# Patient Record
Sex: Female | Born: 1960 | Race: White | Hispanic: No | Marital: Married | Smoking: Never smoker
Health system: Southern US, Community
[De-identification: ages and names within clinical notes are randomized; demographics above are authoritative.]

## PROBLEM LIST (undated history)

## (undated) DIAGNOSIS — J45909 Unspecified asthma, uncomplicated: Secondary | ICD-10-CM

## (undated) DIAGNOSIS — F419 Anxiety disorder, unspecified: Secondary | ICD-10-CM

## (undated) DIAGNOSIS — J189 Pneumonia, unspecified organism: Secondary | ICD-10-CM

## (undated) DIAGNOSIS — F329 Major depressive disorder, single episode, unspecified: Secondary | ICD-10-CM

## (undated) DIAGNOSIS — M1990 Unspecified osteoarthritis, unspecified site: Secondary | ICD-10-CM

## (undated) DIAGNOSIS — D649 Anemia, unspecified: Secondary | ICD-10-CM

## (undated) DIAGNOSIS — Z803 Family history of malignant neoplasm of breast: Secondary | ICD-10-CM

## (undated) DIAGNOSIS — T7840XA Allergy, unspecified, initial encounter: Secondary | ICD-10-CM

## (undated) DIAGNOSIS — K219 Gastro-esophageal reflux disease without esophagitis: Secondary | ICD-10-CM

## (undated) DIAGNOSIS — E785 Hyperlipidemia, unspecified: Secondary | ICD-10-CM

## (undated) DIAGNOSIS — IMO0001 Reserved for inherently not codable concepts without codable children: Secondary | ICD-10-CM

## (undated) DIAGNOSIS — R7303 Prediabetes: Secondary | ICD-10-CM

## (undated) DIAGNOSIS — O24419 Gestational diabetes mellitus in pregnancy, unspecified control: Secondary | ICD-10-CM

## (undated) DIAGNOSIS — F32A Depression, unspecified: Secondary | ICD-10-CM

## (undated) DIAGNOSIS — N6489 Other specified disorders of breast: Secondary | ICD-10-CM

## (undated) DIAGNOSIS — N6002 Solitary cyst of left breast: Secondary | ICD-10-CM

## (undated) HISTORY — DX: Unspecified osteoarthritis, unspecified site: M19.90

## (undated) HISTORY — DX: Gastro-esophageal reflux disease without esophagitis: K21.9

## (undated) HISTORY — PX: COLONOSCOPY: SHX174

## (undated) HISTORY — DX: Hyperlipidemia, unspecified: E78.5

## (undated) HISTORY — DX: Gestational diabetes mellitus in pregnancy, unspecified control: O24.419

## (undated) HISTORY — DX: Anemia, unspecified: D64.9

## (undated) HISTORY — DX: Depression, unspecified: F32.A

## (undated) HISTORY — DX: Unspecified asthma, uncomplicated: J45.909

## (undated) HISTORY — DX: Reserved for inherently not codable concepts without codable children: IMO0001

## (undated) HISTORY — DX: Other specified disorders of breast: N64.89

## (undated) HISTORY — DX: Major depressive disorder, single episode, unspecified: F32.9

## (undated) HISTORY — PX: UPPER GASTROINTESTINAL ENDOSCOPY: SHX188

## (undated) HISTORY — DX: Family history of malignant neoplasm of breast: Z80.3

## (undated) HISTORY — PX: WISDOM TOOTH EXTRACTION: SHX21

## (undated) HISTORY — PX: POLYPECTOMY: SHX149

## (undated) HISTORY — DX: Solitary cyst of left breast: N60.02

## (undated) HISTORY — DX: Pneumonia, unspecified organism: J18.9

## (undated) HISTORY — DX: Allergy, unspecified, initial encounter: T78.40XA

## (undated) HISTORY — DX: Anxiety disorder, unspecified: F41.9

---

## 1985-02-09 DIAGNOSIS — J189 Pneumonia, unspecified organism: Secondary | ICD-10-CM

## 1985-02-09 HISTORY — DX: Pneumonia, unspecified organism: J18.9

## 1997-06-05 ENCOUNTER — Other Ambulatory Visit: Admission: RE | Admit: 1997-06-05 | Discharge: 1997-06-05 | Payer: Self-pay | Admitting: Obstetrics and Gynecology

## 1997-08-23 ENCOUNTER — Other Ambulatory Visit: Admission: RE | Admit: 1997-08-23 | Discharge: 1997-08-23 | Payer: Self-pay | Admitting: Obstetrics and Gynecology

## 1998-06-14 ENCOUNTER — Other Ambulatory Visit: Admission: RE | Admit: 1998-06-14 | Discharge: 1998-06-14 | Payer: Self-pay | Admitting: Obstetrics and Gynecology

## 2000-08-06 ENCOUNTER — Encounter: Payer: Self-pay | Admitting: Otolaryngology

## 2000-08-06 ENCOUNTER — Encounter: Admission: RE | Admit: 2000-08-06 | Discharge: 2000-08-06 | Payer: Self-pay | Admitting: Otolaryngology

## 2002-08-08 ENCOUNTER — Other Ambulatory Visit: Admission: RE | Admit: 2002-08-08 | Discharge: 2002-08-08 | Payer: Self-pay | Admitting: Gynecology

## 2003-02-06 ENCOUNTER — Other Ambulatory Visit: Admission: RE | Admit: 2003-02-06 | Discharge: 2003-02-06 | Payer: Self-pay | Admitting: Gynecology

## 2004-03-06 ENCOUNTER — Other Ambulatory Visit: Admission: RE | Admit: 2004-03-06 | Discharge: 2004-03-06 | Payer: Self-pay | Admitting: Gynecology

## 2005-03-09 ENCOUNTER — Other Ambulatory Visit: Admission: RE | Admit: 2005-03-09 | Discharge: 2005-03-09 | Payer: Self-pay | Admitting: Gynecology

## 2006-03-12 ENCOUNTER — Other Ambulatory Visit: Admission: RE | Admit: 2006-03-12 | Discharge: 2006-03-12 | Payer: Self-pay | Admitting: Gynecology

## 2007-03-16 ENCOUNTER — Other Ambulatory Visit: Admission: RE | Admit: 2007-03-16 | Discharge: 2007-03-16 | Payer: Self-pay | Admitting: Gynecology

## 2008-03-16 ENCOUNTER — Encounter: Payer: Self-pay | Admitting: Women's Health

## 2008-03-16 ENCOUNTER — Other Ambulatory Visit: Admission: RE | Admit: 2008-03-16 | Discharge: 2008-03-16 | Payer: Self-pay | Admitting: Gynecology

## 2008-03-16 ENCOUNTER — Ambulatory Visit: Payer: Self-pay | Admitting: Women's Health

## 2009-03-25 LAB — HM PAP SMEAR: HM Pap smear: NORMAL

## 2009-07-10 LAB — HM COLONOSCOPY

## 2009-07-31 ENCOUNTER — Encounter: Payer: Self-pay | Admitting: Gastroenterology

## 2011-06-01 LAB — HM MAMMOGRAPHY

## 2012-01-10 LAB — HM DEXA SCAN: HM Dexa Scan: NORMAL

## 2012-02-01 DIAGNOSIS — N6002 Solitary cyst of left breast: Secondary | ICD-10-CM

## 2012-02-01 HISTORY — DX: Solitary cyst of left breast: N60.02

## 2012-04-26 ENCOUNTER — Encounter: Payer: Self-pay | Admitting: Obstetrics and Gynecology

## 2012-05-13 ENCOUNTER — Ambulatory Visit: Payer: Self-pay | Admitting: Obstetrics and Gynecology

## 2012-05-13 ENCOUNTER — Encounter: Payer: Self-pay | Admitting: Obstetrics and Gynecology

## 2012-05-13 ENCOUNTER — Ambulatory Visit (INDEPENDENT_AMBULATORY_CARE_PROVIDER_SITE_OTHER): Payer: BC Managed Care – PPO | Admitting: Obstetrics and Gynecology

## 2012-05-13 VITALS — BP 112/60 | Ht 65.0 in | Wt 141.0 lb

## 2012-05-13 DIAGNOSIS — Z01419 Encounter for gynecological examination (general) (routine) without abnormal findings: Secondary | ICD-10-CM

## 2012-05-13 NOTE — Progress Notes (Signed)
52 y.o.  Married  Caucasian female   G2P2002 here for annual exam.  Saw Dr. Farrel Gobble in Dec 2013 for a left breast lump, had dx mammo which showed a cyst, and screening mammo in May 19, 2014was rec. Pt already has that appt.  Also treated recently for outpatient pneumonia. Menses are regular but light, lasting 5-6 days.  No sig v-m sx's.  Father had a stroke in 06/28/11 and died 9 months later, in 2012/02/28.     Patient's last menstrual period was 04/23/2012.          Sexually active: yes  The current method of family planning is IUD.    Exercising: walking 3 days a week Last mammogram:  02/01/12 Lt Breast (benign) 06/02/11 normal Last pap smear:03/25/09 neg History of abnormal pap: no Smoking:no Alcohol: occ wine Last colonoscopy:07/10/09 polyp every 3 years Dr. Chales Abrahams in [place] Last Bone Density: 01/2012 norma.  Ordered  b/c Vit D level was low.  Dr. Darcey Nora who was seeing her for asthma ordered the Vit D and the BMD  Last tetanus shot: 3 years ago Last cholesterol check: 09/2011 normal  Hgb:   pcp             Urine: pcp    Health Maintenance  Topic Date Due  . Tetanus/tdap  05/06/1978  . Pap Smear  03/25/2012  . Influenza Vaccine  10/10/2012  . Mammogram  05/31/2013  . Colonoscopy  07/11/2019    Family History  Problem Relation Age of Onset  . Breast cancer Mother 22  . Depression Mother   . Anxiety disorder Mother   . Osteoporosis Mother   . Depression Sister   . Anxiety disorder Sister   . Stroke Father     died February 23, 2012  . Dementia Father   . Anxiety disorder Brother   . Depression Brother   . Dystonia Brother     cervical    There is no problem list on file for this patient.   Past Medical History  Diagnosis Date  . Depression   . Anxiety   . Asthma   . Reflux   . Gestational diabetes   . Anemia   . Pneumonia 1987  . Single cyst of left breast 02/01/12    Diagnostic mammogram and ultrasound - cyst at 2:30    Past Surgical History  Procedure Laterality Date  .  Cesarean section  28-Jun-1991, 06-27-93    Allergies: Shellfish allergy and Penicillins  Current Outpatient Prescriptions  Medication Sig Dispense Refill  . Budesonide (RHINOCORT AQUA NA) Place into the nose daily.      . budesonide-formoterol (SYMBICORT) 160-4.5 MCG/ACT inhaler Inhale 2 puffs into the lungs 2 (two) times daily.      Marland Kitchen Dexlansoprazole (DEXILANT PO) Take by mouth.      . fluvoxaMINE (LUVOX) 100 MG tablet Take 100 mg by mouth at bedtime.      Marland Kitchen levonorgestrel (MIRENA) 20 MCG/24HR IUD 1 each by Intrauterine route once. Place 12/08/2010.      Marland Kitchen RANITIDINE HCL PO Take by mouth.      . traZODone (DESYREL) 50 MG tablet Take 50 mg by mouth at bedtime. Patient takes 18.5. mg po q hs.       No current facility-administered medications for this visit.    ROS: Pertinent items are noted in HPI.  Exam:    BP 112/60  Ht 5\' 5"  (1.651 m)  Wt 141 lb (63.957 kg)  BMI 23.46 kg/m2  LMP 04/23/2012  Wt Readings from Last 3 Encounters:  05/13/12 141 lb (63.957 kg)     Ht Readings from Last 3 Encounters:  05/13/12 5\' 5"  (1.651 m)    General appearance: alert, cooperative and appears stated age Head: Normocephalic, without obvious abnormality, atraumatic Neck: no adenopathy, supple, symmetrical, trachea midline and thyroid not enlarged, symmetric, no tenderness/mass/nodules Lungs: clear to auscultation bilaterally Breasts: Inspection negative, No nipple retraction or dimpling, No nipple discharge or bleeding, No axillary or supraclavicular adenopathy, Normal to palpation without dominant masses Heart: regular rate and rhythm Abdomen: soft, non-tender; bowel sounds normal; no masses,  no organomegaly Extremities: extremities normal, atraumatic, no cyanosis or edema Skin: Skin color, texture, turgor normal. No rashes or lesions Lymph nodes: Cervical, supraclavicular, and axillary nodes normal. No abnormal inguinal nodes palpated Neurologic: Grossly normal   Pelvic: External genitalia:  no  lesions              Urethra:  normal appearing urethra with no masses, tenderness or lesions              Bartholins and Skenes: normal                 Vagina: normal appearing vagina with normal color and discharge, no lesions              Cervix: normal appearance, IUD strings visualized              Pap taken: yes        Bimanual Exam:  Uterus:  uterus is normal size, shape, consistency and nontender, anteflexed, small, mobile                                      Adnexa: normal adnexa in size, nontender and no masses                                      Rectovaginal: Confirms                                      Anus:  normal sphincter tone, no lesions  A: normal perimenopausal exam, Mirena in place since 12/08/2010     P: mammogram pap smear counseled on breast self exam, mammography screening, adequate intake of calcium and vitamin D, diet and exercise return annually or prn     Oldest son is a Printmaker at  [employer] in [place] and loving it; youngest son is sr in high school and has bipolar illness, planning to go to community college in [place] or to [place].      An After Visit Summary was printed and given to the patient.

## 2012-05-13 NOTE — Patient Instructions (Signed)

## 2012-11-09 ENCOUNTER — Encounter: Payer: Self-pay | Admitting: Gastroenterology

## 2013-05-19 ENCOUNTER — Ambulatory Visit: Payer: BC Managed Care – PPO | Admitting: Obstetrics and Gynecology

## 2013-05-25 ENCOUNTER — Ambulatory Visit (INDEPENDENT_AMBULATORY_CARE_PROVIDER_SITE_OTHER): Payer: Managed Care, Other (non HMO) | Admitting: Obstetrics & Gynecology

## 2013-05-25 ENCOUNTER — Ambulatory Visit: Payer: BC Managed Care – PPO | Admitting: Obstetrics & Gynecology

## 2013-05-25 ENCOUNTER — Encounter: Payer: Self-pay | Admitting: Obstetrics & Gynecology

## 2013-05-25 VITALS — BP 106/64 | HR 68 | Resp 16 | Ht 65.25 in | Wt 147.8 lb

## 2013-05-25 DIAGNOSIS — N951 Menopausal and female climacteric states: Secondary | ICD-10-CM

## 2013-05-25 DIAGNOSIS — Z01419 Encounter for gynecological examination (general) (routine) without abnormal findings: Secondary | ICD-10-CM

## 2013-05-25 NOTE — Patient Instructions (Signed)

## 2013-05-25 NOTE — Progress Notes (Addendum)
53 y.o. [id number] MarriedCaucasianF here for annual exam.  Has Mirena IUD that was placed 12/08/10.  Still has monthly cycle.  Is light and spotting but lasts about a week.  Has noticed more hot flashes.  Wonders if this is early menopause or her Tempu-pedic mattress.  Patient's last menstrual period was 05/22/2013.          Sexually active: yes  The current method of family planning is IUD.    Exercising: yes   Smoker:  no  Health Maintenance: Pap:  05/13/12-WNL/negative HR HPV History of abnormal Pap:  yes MMG:  06/01/12 3D-normal Colonoscopy:  10/14--repeat in 5 years.  Dr Lyndel Safe in [place].  Records scanned into EPIC. BMD:   2014-normal at [place]:  Up to date with PCP Screening Labs: PCP, Hb today: PCP, Urine today: PCP   reports that she has never smoked. She has never used smokeless tobacco. She reports that she drinks alcohol. She reports that she does not use illicit drugs.  Past Medical History  Diagnosis Date  . Depression   . Anxiety   . Asthma   . Reflux   . Gestational diabetes   . Anemia   . Pneumonia 1987  . Single cyst of left breast 02/01/12    Diagnostic mammogram and ultrasound - cyst at 2:30    Past Surgical History  Procedure Laterality Date  . Cesarean section  1993, 1995    Current Outpatient Prescriptions  Medication Sig Dispense Refill  . budesonide-formoterol (SYMBICORT) 160-4.5 MCG/ACT inhaler Inhale 2 puffs into the lungs 2 (two) times daily.      . Cholecalciferol (VITAMIN D PO) Take 1,000 Int'l Units by mouth daily.      Marland Kitchen Dexlansoprazole (DEXILANT PO) Take by mouth.      . fluvoxaMINE (LUVOX) 100 MG tablet Take 100 mg by mouth at bedtime.      Marland Kitchen levonorgestrel (MIRENA) 20 MCG/24HR IUD 1 each by Intrauterine route once. Place 12/08/2010.      . traZODone (DESYREL) 50 MG tablet Take 50 mg by mouth at bedtime. Patient takes 60.5. mg po q hs.      . Budesonide (RHINOCORT AQUA NA) Place into the nose daily.       No current  facility-administered medications for this visit.    Family History  Problem Relation Age of Onset  . Breast cancer Mother 68  . Depression Mother   . Anxiety disorder Mother   . Osteoporosis Mother   . Depression Sister   . Anxiety disorder Sister   . Stroke Father     died 02/29/12  . Dementia Father   . Anxiety disorder Brother   . Depression Brother   . Dystonia Brother     cervical  . Thyroid disease Mother     on synthroid    ROS:  Pertinent items are noted in HPI.  Otherwise, a comprehensive ROS was negative.  Exam:   BP 106/64  Pulse 68  Resp 16  Ht 5' 5.25" (1.657 m)  Wt 147 lb 12.8 oz (67.042 kg)  BMI 24.42 kg/m2  LMP 05/22/2013  Weight change: -5lb  Height: 5' 5.25" (165.7 cm)  Ht Readings from Last 3 Encounters:  05/25/13 5' 5.25" (1.657 m)  05/13/12 5\' 5"  (1.651 m)    General appearance: alert, cooperative and appears stated age Head: Normocephalic, without obvious abnormality, atraumatic Neck: no adenopathy, supple, symmetrical, trachea midline and thyroid normal to inspection and palpation Lungs: clear to auscultation bilaterally Breasts: normal  appearance, no masses or tenderness Heart: regular rate and rhythm Abdomen: soft, non-tender; bowel sounds normal; no masses,  no organomegaly Extremities: extremities normal, atraumatic, no cyanosis or edema Skin: Skin color, texture, turgor normal. No rashes or lesions Lymph nodes: Cervical, supraclavicular, and axillary nodes normal. No abnormal inguinal nodes palpated Neurologic: Grossly normal   Pelvic: External genitalia:  no lesions              Urethra:  normal appearing urethra with no masses, tenderness or lesions              Bartholins and Skenes: normal                 Vagina: normal appearing vagina with normal color and discharge, no lesions              Cervix: no lesions, IUD string noted              Pap taken: no Bimanual Exam:  Uterus:  normal size, contour, position, consistency,  mobility, non-tender              Adnexa: normal adnexa and no mass, fullness, tenderness               Rectovaginal: Confirms               Anus:  normal sphincter tone, no lesions  A:  Well Woman with normal exam Perimenopausal symptoms Mirena IUD for [facility], placed 10/12 Family hx of breast cancer in mother age 54  P:   Mammogram yearly.  D/W pt 3D due to breast density. pap smear not obtained.  Neg pap with neg HR HPV 4/14 Long today return annually or prn  An After Visit Summary was printed and given to the patient.

## 2013-05-26 LAB — FOLLICLE STIMULATING HORMONE: FSH: 35 m[IU]/mL

## 2013-08-23 ENCOUNTER — Telehealth: Payer: Self-pay | Admitting: Obstetrics & Gynecology

## 2013-08-23 NOTE — Telephone Encounter (Signed)
Patient thinks she is having menopausal symptoms. Patient would an appointment with [facility].

## 2013-08-24 NOTE — Telephone Encounter (Signed)
Spoke with patient. Patient is feeling increased fatigued and "brain fog." Has recent appointment with primary care and had blood work done which patient states was normal. No hot flashes. No bleeding, but still having some slight spotting with mirena.  Requests office visit with Dr. Sabra Heck to discuss hormone concerns. Will bring copies of blood work from [facility]. Requests Friday appointment. Scheduled office visit with Dr. Sabra Heck for 09/22/13. She will call back with any worsening symptoms or concerns as needed.  Routing to provider for final review. Patient agreeable to disposition. Will close encounter

## 2013-09-22 ENCOUNTER — Ambulatory Visit (INDEPENDENT_AMBULATORY_CARE_PROVIDER_SITE_OTHER): Payer: Managed Care, Other (non HMO) | Admitting: Obstetrics & Gynecology

## 2013-09-22 VITALS — BP 118/80 | HR 68 | Resp 16 | Wt 149.2 lb

## 2013-09-22 DIAGNOSIS — R5381 Other malaise: Secondary | ICD-10-CM

## 2013-09-22 DIAGNOSIS — N951 Menopausal and female climacteric states: Secondary | ICD-10-CM

## 2013-09-22 DIAGNOSIS — R7309 Other abnormal glucose: Secondary | ICD-10-CM

## 2013-09-22 DIAGNOSIS — R5383 Other fatigue: Secondary | ICD-10-CM

## 2013-09-22 MED ORDER — ESTRADIOL 1 MG PO TABS: 1.0000 mg | ORAL_TABLET | Freq: Every day | ORAL | Status: DC

## 2013-09-22 NOTE — Progress Notes (Addendum)
Subjective:     Patient ID: Allison Soto, female   DOB: 1960-04-15, 53 y.o.   MRN: [id number]  HPI 53 yo G2P2 here for discussion of what she feels like are "hormonal" symptoms.  Feels like her focus is not the same.  She feels very "disjointed" in the way she thinks and then speaks.  Pt did increase her to Luvox 150mg .  She did this for about a month and she doesn't feel like it made any difference.  Pt saw PCP at [facility] 01/1913 and had a lot of labs done.  Brought those with her.  Reviewed with pt.  Will be scanned into EPIC.  ANA, Antistreptolysin O-titer, CRP, CMP, uric acid, parvovirus testing, RF, sed rate.  All was normal.  Also had lipids 2/15 which was also normal.  She is having some mild hot flashes but this is tolerable.  It is the difficulty feeling mentally clear as well as fatigue.  Reports at about 3pm in the afternoon, she feels completely exhausted.  Sleep is ok.    Review of Systems  All other systems reviewed and are negative.      Objective:   Physical Exam  Constitutional: She is oriented to person, place, and time. She appears well-developed and well-nourished.  Neurological: She is alert and oriented to person, place, and time.  Skin: Skin is warm and dry.  Psychiatric: She has a normal mood and affect.       Assessment:     Possible menopausal changes Mirena IUD with amenorrhea (placed 10/12)    Plan:     Will start Estradiol 1.0 1/2 tab daily.  Risks of DVT/PE, stroke, MI, and breast cancer all discussed.  Pt willing and desirous of proceeding FSH, CBC and ferritin HbA1C Vit B 12, Vit d  TSH     ~20 minutes spent with patient >50% of time was in face to face discussion of above.

## 2013-09-23 LAB — FOLLICLE STIMULATING HORMONE: FSH: 8.3 m[IU]/mL

## 2013-09-23 LAB — CBC
HEMATOCRIT: 44 % (ref 36.0–46.0)
HEMOGLOBIN: 15.4 g/dL — AB (ref 12.0–15.0)
MCH: 31.7 pg (ref 26.0–34.0)
MCHC: 35 g/dL (ref 30.0–36.0)
MCV: 90.5 fL (ref 78.0–100.0)
Platelets: 243 10*3/uL (ref 150–400)
RBC: 4.86 MIL/uL (ref 3.87–5.11)
RDW: 13.2 % (ref 11.5–15.5)
WBC: 8.9 10*3/uL (ref 4.0–10.5)

## 2013-09-23 LAB — VITAMIN B12: Vitamin B-12: 613 pg/mL (ref 211–911)

## 2013-09-23 LAB — FERRITIN: Ferritin: 67 ng/mL (ref 10–291)

## 2013-09-23 LAB — HEMOGLOBIN A1C
Hgb A1c MFr Bld: 6 % — ABNORMAL HIGH (ref <5.7)
MEAN PLASMA GLUCOSE: 126 mg/dL — AB (ref <117)

## 2013-09-23 LAB — VITAMIN D 25 HYDROXY (VIT D DEFICIENCY, FRACTURES): VIT D 25 HYDROXY: 63 ng/mL (ref 30–89)

## 2013-09-23 LAB — TSH: TSH: 1.222 u[IU]/mL (ref 0.350–4.500)

## 2013-09-27 ENCOUNTER — Encounter: Payer: Self-pay | Admitting: Obstetrics & Gynecology

## 2013-09-27 DIAGNOSIS — R5383 Other fatigue: Secondary | ICD-10-CM

## 2013-09-27 DIAGNOSIS — R5381 Other malaise: Secondary | ICD-10-CM | POA: Insufficient documentation

## 2013-09-27 DIAGNOSIS — R7309 Other abnormal glucose: Secondary | ICD-10-CM | POA: Insufficient documentation

## 2013-09-28 ENCOUNTER — Telehealth: Payer: Self-pay

## 2013-09-28 NOTE — Telephone Encounter (Signed)
Pt informed of results and voiced understanding. Pt PCP is Dr. Ann Held in [place]. She states she will follow up with him. Pt has made an appt for an recheck on 11/14/13.  Routed to provider for final review Encounter closed

## 2013-09-28 NOTE — Telephone Encounter (Signed)
Notes Recorded by Lyman Speller, MD on 09/27/2013 at 6:13 AM Please inform pt that TSH, CBC, B12, Ferritin, and Vit D are all normal. HbA1C is 6.0 which is elevated and shows borderline diabetes. Who is her PCP? She does need follow up with them. Her Rand is in the normal range. I am ok with her continuing the trial of HRT. She needs follow up again in 6-8 weeks for recheck.

## 2013-10-26 ENCOUNTER — Other Ambulatory Visit: Payer: Self-pay | Admitting: Obstetrics & Gynecology

## 2013-10-26 NOTE — Telephone Encounter (Signed)
Pt says she is needing a refill on her Estradiol sent to [facility] in [place]. Phone number is [phone].

## 2013-10-27 ENCOUNTER — Other Ambulatory Visit: Payer: Self-pay | Admitting: Obstetrics & Gynecology

## 2013-10-27 MED ORDER — ESTRADIOL 1 MG PO TABS: 1.0000 mg | ORAL_TABLET | Freq: Every day | ORAL | Status: DC

## 2013-10-27 NOTE — Telephone Encounter (Signed)
Last AEX: 05/25/13 Last refill:09/22/13 #30 X 0 Current AEX:06/01/14 Last MMG: 06/21/13 Bi-Rads Neg  Please advise

## 2013-10-30 NOTE — Telephone Encounter (Signed)
Last refilled: 10/27/13 #30/0refills by Dr. Sabra Heck Last AEX: 05/25/13 with Dr. Bernita Buffy Scheduled: 06/01/14 with Dr. Sabra Heck Last Mammogram: 06/19/13 Bi-Rads 0 -06/21/13 Right Breast US - Bi-Rads 2:Benign  Please Advise.

## 2013-11-14 ENCOUNTER — Ambulatory Visit (INDEPENDENT_AMBULATORY_CARE_PROVIDER_SITE_OTHER): Payer: Managed Care, Other (non HMO) | Admitting: Nurse Practitioner

## 2013-11-14 ENCOUNTER — Encounter: Payer: Self-pay | Admitting: Obstetrics & Gynecology

## 2013-11-14 VITALS — BP 112/74 | HR 64 | Resp 16 | Wt 153.8 lb

## 2013-11-14 DIAGNOSIS — Z79890 Hormone replacement therapy (postmenopausal): Secondary | ICD-10-CM

## 2013-11-14 DIAGNOSIS — R0789 Other chest pain: Secondary | ICD-10-CM

## 2013-11-14 DIAGNOSIS — R071 Chest pain on breathing: Secondary | ICD-10-CM

## 2013-11-14 MED ORDER — ESTRADIOL 1 MG PO TABS: 1.0000 mg | ORAL_TABLET | Freq: Every day | ORAL | Status: DC

## 2013-11-14 NOTE — Progress Notes (Signed)
Subjective:     Patient ID: Allison Soto, female   DOB: [birth year], 53 y.o.   MRN: [id number]  HPI  This 53 yo MW Fe comes for 2 problems.  She was to return for a 6 weeks follow up on ERT.   She has a Mirena IUD since 11/2010 with regular menses.  But recently 6 weeks ago started on Estrogen replacement for 'brain fog', mood changes and general anxiety symptoms.  Has felt better on HRT other than initially with breast tenderness that has resolved.  She feels that she has done much better on ERT and wants to continue.  Her second problem was a sudden onset of chest wall pain at the [place] area and under left breast that occurred in June.  States she was sitting in her car and was talking with another drug rep in their car when she turned suddenly at an odd angle causing her to have this pain.  It was severe enough to take her breathe.  She had pain 7/10 scale and is non radiating.  She later felt better and went as planned on her trip to [place].  More recently went to PCP and had evaluation in which he thought she had costal Costochondritis and gave her Voltaren gel to use prn.  She did not want to use that on her breast area.  He also did a stress test which was normal.  Now when she touches the area is the only time it hurts.  No pain with deep breathing or upper body movement. Denies dyspnea, cough, URI ,and GI symptoms.  Some history of flatus but nothing new.  She has stopped wearing an under wire bra with some help.  No history of trauma and / or fall.   Review of Systems  Constitutional: Negative for fever, chills, diaphoresis, activity change, appetite change and fatigue.  HENT: Negative.   Eyes: Negative.   Respiratory: Negative for apnea, cough, choking, chest tightness, shortness of breath, wheezing and stridor.   Cardiovascular: Negative for chest pain, palpitations and leg swelling.       Negative stress test 10/14/13  Gastrointestinal: Negative.  Negative for nausea, abdominal pain,  constipation, anal bleeding and rectal pain.  Endocrine: Negative for heat intolerance, polydipsia and polyphagia.  Genitourinary: Negative for dysuria, frequency, hematuria, flank pain, decreased urine volume, vaginal bleeding, vaginal discharge, difficulty urinating, vaginal pain, menstrual problem, pelvic pain and dyspareunia.  Musculoskeletal: Negative.   Skin: Negative.   Neurological: Negative.   Psychiatric/Behavioral: Negative.  Negative for confusion and agitation. The patient is not nervous/anxious.        Objective:   Physical Exam  Constitutional: She is oriented to person, place, and time. She appears well-developed and well-nourished.  Cardiovascular: Normal rate and regular rhythm.   Pulmonary/Chest: Effort normal and breath sounds normal. No respiratory distress. She has no wheezes. She has no rales. She exhibits tenderness.  Abdominal: Soft. There is no tenderness. There is no rebound.    No flank pain.  There is tenderness along the Xiphoid to the most tender area under the left breast at the 5 th intercostal space about 7:00 position under the breast.  Bilaterally there is no breast mass, nipple discharge, nodes.  The pain can only be reproducible with touch.  Musculoskeletal: Normal range of motion.  Neurological: She is alert and oriented to person, place, and time.  Skin: Skin is warm and dry.  Psychiatric: She has a normal mood and affect. Her behavior is normal. Judgment  and thought content normal.       Assessment:     Menopausal symptoms improved on Estradiol 1 mg along with Mirena IUD Costochondritis of left chest wall - most likely a pull injury in June - getting better    Plan:     Will have her to avoid any manipulation of this area and try OTC NSAID's with Aleve BID for a week If symptoms persist will see her back She will continue with Estradiol 1 mg daily as directed - refill until AEX in April.

## 2013-11-15 NOTE — Progress Notes (Signed)
Reviewed personally.  M. Suzanne Hydeia Mcatee, MD.  

## 2013-12-11 ENCOUNTER — Encounter: Payer: Self-pay | Admitting: Obstetrics & Gynecology

## 2014-06-01 ENCOUNTER — Ambulatory Visit (INDEPENDENT_AMBULATORY_CARE_PROVIDER_SITE_OTHER): Payer: Managed Care, Other (non HMO) | Admitting: Obstetrics & Gynecology

## 2014-06-01 ENCOUNTER — Encounter: Payer: Self-pay | Admitting: Obstetrics & Gynecology

## 2014-06-01 VITALS — BP 118/82 | HR 96 | Ht 65.0 in | Wt 152.8 lb

## 2014-06-01 DIAGNOSIS — Z0000 Encounter for general adult medical examination without abnormal findings: Secondary | ICD-10-CM

## 2014-06-01 DIAGNOSIS — Z01419 Encounter for gynecological examination (general) (routine) without abnormal findings: Secondary | ICD-10-CM | POA: Diagnosis not present

## 2014-06-01 DIAGNOSIS — Z124 Encounter for screening for malignant neoplasm of cervix: Secondary | ICD-10-CM

## 2014-06-01 LAB — POCT URINALYSIS DIPSTICK
LEUKOCYTES UA: NEGATIVE
UROBILINOGEN UA: NEGATIVE
pH, UA: 5

## 2014-06-01 NOTE — Addendum Note (Signed)
Addended by: Megan Salon on: 06/01/2014 02:58 PM   Modules accepted: Orders, SmartSet

## 2014-06-01 NOTE — Progress Notes (Signed)
54 y.o. [id number] MarriedCaucasianF here for annual exam.  Doing well.  Having just spotting about every month.  This is very light.  [place]ank last year was 8.3.    Pt was on HRT in the past due to lack of focus.  This didn't help.  Tried wellbutrin as well.  This mad her feel like she was "jumping out of her skin".  Was then tried on Focalin.  Pt really thinks this helped A LOT!!  PCP:  Dr. Nicki Reaper.  Planning on doing blood work there this summer.    Patient's last menstrual period was 04/30/2014.          Sexually active: Yes.    The current method of family planning is Mirena IUD.    Exercising: Yes.    walk 3-5x/wk Smoker:  no  Health Maintenance: Pap:  05/13/12 NEG HR HPV negative History of abnormal Pap:  no MMG: 06/19/13 Bi-Rads 0; Right Breast Ultrasound oval complicated cyst benign. Colonoscopy:  11/09/2012 Polyp found/removed f/u in 2019 BMD:   2013-Normal TDaP:  2014 Screening Labs: Done by PCP: Ann Held, MD, Urine today: Glucose +   reports that she has never smoked. She has never used smokeless tobacco. She reports that she drinks alcohol. She reports that she does not use illicit drugs.  Past Medical History  Diagnosis Date  . Depression   . Anxiety   . Asthma   . Reflux   . Gestational diabetes   . Anemia   . Pneumonia 1987  . Single cyst of left breast 02/01/12    Diagnostic mammogram and ultrasound - cyst at 2:30    Past Surgical History  Procedure Laterality Date  . Cesarean section  1993, 1995    Current Outpatient Prescriptions  Medication Sig Dispense Refill  . budesonide-formoterol (SYMBICORT) 160-4.5 MCG/ACT inhaler Inhale 2 puffs into the lungs 2 (two) times daily.    . Cetirizine HCl (ZYRTEC PO) Take by mouth.    . Cholecalciferol (VITAMIN D PO) Take 1,000 Int'l Units by mouth daily.    Marland Kitchen Dexlansoprazole (DEXILANT PO) Take by mouth.    . dexmethylphenidate (FOCALIN XR) 5 MG 24 hr capsule Take 5 mg by mouth daily.  0  . fluvoxaMINE (LUVOX) 100 MG tablet  Take 100 mg by mouth at bedtime. Taking 150mg  daily    . levonorgestrel (MIRENA) 20 MCG/24HR IUD 1 each by Intrauterine route once. Place 12/08/2010.    Marland Kitchen RHINOCORT AQUA 32 MCG/ACT nasal spray as needed.  1  . traZODone (DESYREL) 50 MG tablet Take 50 mg by mouth at bedtime. Patient takes 20.5. mg po q hs.     No current facility-administered medications for this visit.    Family History  Problem Relation Age of Onset  . Breast cancer Mother 77  . Depression Mother   . Anxiety disorder Mother   . Osteoporosis Mother   . Depression Sister   . Anxiety disorder Sister   . Stroke Father     died March 05, 2012  . Dementia Father   . Anxiety disorder Brother   . Depression Brother   . Dystonia Brother     cervical  . Thyroid disease Mother     on synthroid    ROS:  Pertinent items are noted in HPI.  Otherwise, a comprehensive ROS was negative.  Exam:   BP 118/82 mmHg  Ht 5\' 5"  (1.651 m)  Wt 152 lb 12.8 oz (69.31 kg)  BMI 25.43 kg/m2  LMP 04/30/2014  +5#  Height:  5\' 5"  (165.1 cm)  Ht Readings from Last 3 Encounters:  06/01/14 5\' 5"  (1.651 m)  05/25/13 5' 5.25" (1.657 m)  05/13/12 5\' 5"  (1.651 m)    General appearance: alert, cooperative and appears stated age Head: Normocephalic, without obvious abnormality, atraumatic Neck: no adenopathy, supple, symmetrical, trachea midline and thyroid normal to inspection and palpation Lungs: clear to auscultation bilaterally Breasts: normal appearance, no masses or tenderness Heart: regular rate and rhythm Abdomen: soft, non-tender; bowel sounds normal; no masses,  no organomegaly Extremities: extremities normal, atraumatic, no cyanosis or edema Skin: Skin color, texture, turgor normal. No rashes or lesions Lymph nodes: Cervical, supraclavicular, and axillary nodes normal. No abnormal inguinal nodes palpated Neurologic: Grossly normal   Pelvic: External genitalia:  no lesions              Urethra:  normal appearing urethra with no  masses, tenderness or lesions              Bartholins and Skenes: normal                 Vagina: normal appearing vagina with normal color and discharge, no lesions              Cervix: no lesions              Pap taken: Yes.   Bimanual Exam:  Uterus:  normal size, contour, position, consistency, mobility, non-tender              Adnexa: normal adnexa and no mass, fullness, tenderness               Rectovaginal: Confirms               Anus:  normal sphincter tone, no lesions  Chaperone was present for exam.  A:  Well Woman with normal exam Perimenopausal symptoms   Mirena IUD for [facility], placed 10/12 Family hx of breast cancer in mother age 23  P: Mammogram yearly. D/W pt 3D due to breast density. Neg pap with neg HR HPV 4/14.  Pap only today Plan [facility] next year.  May need to plan to replaced Mirena next year if [facility] is low. return annually or prn

## 2014-06-06 LAB — IPS PAP TEST WITH REFLEX TO HPV

## 2015-01-31 ENCOUNTER — Ambulatory Visit (INDEPENDENT_AMBULATORY_CARE_PROVIDER_SITE_OTHER): Payer: Managed Care, Other (non HMO) | Admitting: Obstetrics & Gynecology

## 2015-01-31 ENCOUNTER — Ambulatory Visit (INDEPENDENT_AMBULATORY_CARE_PROVIDER_SITE_OTHER): Payer: Managed Care, Other (non HMO)

## 2015-01-31 ENCOUNTER — Encounter: Payer: Self-pay | Admitting: Obstetrics & Gynecology

## 2015-01-31 VITALS — BP 110/66 | HR 64 | Temp 97.7°F | Ht 65.0 in | Wt 148.0 lb

## 2015-01-31 DIAGNOSIS — R102 Pelvic and perineal pain: Secondary | ICD-10-CM | POA: Diagnosis not present

## 2015-01-31 DIAGNOSIS — D251 Intramural leiomyoma of uterus: Secondary | ICD-10-CM

## 2015-01-31 DIAGNOSIS — Z975 Presence of (intrauterine) contraceptive device: Secondary | ICD-10-CM | POA: Diagnosis not present

## 2015-01-31 DIAGNOSIS — M545 Low back pain: Secondary | ICD-10-CM

## 2015-01-31 LAB — POCT URINALYSIS DIPSTICK
Bilirubin, UA: NEGATIVE
Glucose, UA: NEGATIVE
Ketones, UA: NEGATIVE
Leukocytes, UA: NEGATIVE
Nitrite, UA: NEGATIVE
PROTEIN UA: NEGATIVE
RBC UA: NEGATIVE
UROBILINOGEN UA: NEGATIVE
pH, UA: 5

## 2015-01-31 MED ORDER — CYCLOBENZAPRINE HCL 10 MG PO TABS: 10.0000 mg | ORAL_TABLET | Freq: Three times a day (TID) | ORAL | Status: DC | PRN

## 2015-01-31 MED ORDER — HYDROCODONE-ACETAMINOPHEN 5-325 MG PO TABS: 1.0000 | ORAL_TABLET | Freq: Four times a day (QID) | ORAL | Status: DC | PRN

## 2015-01-31 NOTE — Progress Notes (Signed)
Subjective:     Patient ID: Allison Soto, female   DOB: 1961/01/12, 54 y.o.   MRN: [id number]  HPI 54 yo G2P2 MWF here for complaint of lower pelvic cramping with associated lower left back pain.  She was recently at the beach and did a lot of walking in the sand but denies any type of trauma or injury.  Denies recurrent low back issues.  She's a bit anxious about this going into the holiday weekend.    Denies dysuria and hematuria.  Denies urinary urgency.  Denies fever.  No hx of renal stones.  LMP was 10 days ago.  This was a normal "cycle" Cycle did skip for a few months in the summer.  Has mirena IUD so flow isn't heavy.  U[facility] 4/15 was 35.  Review of Systems  All other systems reviewed and are negative.      Objective:   Physical Exam  Constitutional: She is oriented to person, place, and time. She appears well-developed and well-nourished.  Abdominal: Soft. Bowel sounds are normal. She exhibits no distension. There is no tenderness. There is no rebound and no guarding.  Genitourinary: Vagina normal. There is no rash, tenderness, lesion or injury on the right labia. There is no rash, tenderness, lesion or injury on the left labia. Uterus is enlarged (about 10 weeks size and mobile, smooth contour). Cervix exhibits no motion tenderness, no discharge and no friability. Right adnexum displays no mass, no tenderness and no fullness. Left adnexum displays no mass, no tenderness and no fullness.  IUD string noted.  Lymphadenopathy:       Right: No inguinal adenopathy present.       Left: No inguinal adenopathy present.  Neurological: She is alert and oriented to person, place, and time.  Skin: Skin is warm and dry.  Psychiatric: She has a normal mood and affect.   D/w pt proceeding with PUS since nothing on physical exam was abnormal and POCT of urine was negative.  Pt in agreement.    Findings on PUS: Uterus:  10.5 x 6.5 x 5.4cm with IUD in correct location and multiple small  fibroids measuring 2.2cm, 0.6cm, 1.0cm, 0.6cm, 2.4cm Endometrium:  3.5mm Ovary:  Left 2.1 x 1.4 x 1.3cm   Right 2.2 x 1.4 x [id number] with 65mm follicle    Assessment:     Left low back pain Small uterine fibroids Small 41mm right follicle     Plan:     With no findings on urine testing, physical exam or ultrasound except for small fibroids and correctly placed IUD, pt may have pulled low back muscle.  Pt will try flexeril 10mg  up to TID.  RX for 10 Vicodin 5/325mg  given to be taken 1-2 every 6 hours as needed.  Heat and stretching encouraged.  Pt knows to be seen in ER or urgent care if symptoms worsen.  Pt voices clear understanding.  In total, about 30 minutes spent with pt with >50% of this time in face to face discussion of findings, ultrasound, treatment, possible causes of pain.

## 2015-02-06 DIAGNOSIS — Z975 Presence of (intrauterine) contraceptive device: Secondary | ICD-10-CM | POA: Insufficient documentation

## 2015-02-06 DIAGNOSIS — D251 Intramural leiomyoma of uterus: Secondary | ICD-10-CM | POA: Insufficient documentation

## 2015-02-07 ENCOUNTER — Other Ambulatory Visit: Payer: Managed Care, Other (non HMO)

## 2015-04-17 ENCOUNTER — Other Ambulatory Visit: Payer: Self-pay | Admitting: Obstetrics and Gynecology

## 2015-05-20 ENCOUNTER — Telehealth: Payer: Self-pay | Admitting: Obstetrics & Gynecology

## 2015-05-20 NOTE — Telephone Encounter (Addendum)
appt made for tomorrow @ 4:pm. Left voicemail for pt appt rescheduled. Was scheduled at 9:15. Rescheduled for 4:00pm Tuesday.  MMG 07/10/14 faxing to [place] from [place].  Has appt this June.

## 2015-05-20 NOTE — Telephone Encounter (Signed)
I do not have pt on this medication for her last two visits.  She stopped it prior to her last AEX.  She needs an OV to discuss restarting this as it did not help the last time.  Also, she is almost two years overdue for a MMG.  Last one was 5/15.  She will need a MMG before starting HRT.

## 2015-05-20 NOTE — Telephone Encounter (Signed)
Patient calling for a refill on Estradiol sent to [facility] at [phone]. She thought the pharmacy had already requested this for her.

## 2015-05-20 NOTE — Telephone Encounter (Signed)
Medication refill request: Estradiol  Last AEX:  06/01/14 SM Next AEX: 08/16/15 SM Last MMG (if hormonal medication request): 06/21/13 [place] Right BIRADS2:Benign  Refill authorized: ?

## 2015-05-21 ENCOUNTER — Ambulatory Visit (INDEPENDENT_AMBULATORY_CARE_PROVIDER_SITE_OTHER): Payer: Managed Care, Other (non HMO) | Admitting: Obstetrics & Gynecology

## 2015-05-21 VITALS — BP 112/82 | HR 80 | Resp 16 | Ht 65.0 in | Wt 148.0 lb

## 2015-05-21 DIAGNOSIS — R52 Pain, unspecified: Secondary | ICD-10-CM

## 2015-05-21 DIAGNOSIS — N951 Menopausal and female climacteric states: Secondary | ICD-10-CM | POA: Diagnosis not present

## 2015-05-21 MED ORDER — ESTRADIOL 1 MG PO TABS: 1.0000 mg | ORAL_TABLET | Freq: Every day | ORAL | Status: DC

## 2015-05-21 NOTE — Telephone Encounter (Signed)
LM for pt re: apt rescheduled from 9:15am to 4:00pm today

## 2015-05-22 ENCOUNTER — Encounter: Payer: Self-pay | Admitting: Obstetrics & Gynecology

## 2015-05-22 DIAGNOSIS — R52 Pain, unspecified: Secondary | ICD-10-CM | POA: Insufficient documentation

## 2015-05-22 LAB — FOLLICLE STIMULATING HORMONE: FSH: 14.7 m[IU]/mL

## 2015-05-22 NOTE — Progress Notes (Signed)
GYNECOLOGY  VISIT   HPI: 55 y.o. G39P2002 Married Caucasian female here to discuss possible HRT use.  Pt's cycles have gotten much more irregular this past year.  She's actually gone several months without a cycle until 05/20/15.  This was light and short, however.  Pt notes in the past six months that her hot flashes have increased and she is having some night sweats.  The biggest issue for her, however, is joint pain.  She brought labs with her from her PCP, Dr. Nicki Reaper, whose done extensive testing for arthritis and rheumatologic disorders.  This blood work is all normal.  He and pt discussed HRT.  She was on this a few years ago when she was having hot flashes but she was perimenopausal then and it didn't really help that much.  She would like to consider using it again.  Last Mammogram: 2016 at [place].  Brought this with her today.  Will have scanned into EPIC Last AEX:  06/01/14 Last Pap smear:  4/16  Discussed with patient risks and benefits and specifically the WHI study including but not limited to risks of increased risks of heart disease, MI, stroke, DVT, and breast cancer.  Increased risks of gall bladder disease and change in cholesterol panels also discussed.  Possibility of increased bleeding was discussed as patient does have a uterus.  Benefits of improved quality of life, improved bone density and decreased risks of colon cancer also discussed.    Recent lab work:  [facility]: 8.3 on 09/22/13     GYNECOLOGIC HISTORY: Patient's last menstrual period was 05/20/2015. Contraception:  Mirena IUD placed 10/12  Patient Active Problem List   Diagnosis Date Noted  . Fibroids, intramural 02/06/2015  . IUD (intrauterine device) in place 02/06/2015  . Other malaise and fatigue 09/27/2013  . Elevated glucose 09/27/2013    Past Medical History  Diagnosis Date  . Depression   . Anxiety   . Asthma   . Reflux   . Gestational diabetes   . Anemia   . Pneumonia 1987  . Single cyst of left breast  02/01/12    Diagnostic mammogram and ultrasound - cyst at 2:30    Past Surgical History  Procedure Laterality Date  . Cesarean section  1993, 1995    MEDS:  Reviewed in EPIC and UTD  ALLERGIES: Shellfish allergy and Penicillins  Family History  Problem Relation Age of Onset  . Breast cancer Mother 67  . Depression Mother   . Anxiety disorder Mother   . Osteoporosis Mother   . Depression Sister   . Anxiety disorder Sister   . Stroke Father     died Feb 21, 2012  . Dementia Father   . Anxiety disorder Brother   . Depression Brother   . Dystonia Brother     cervical  . Thyroid disease Mother     on synthroid    SH:  Married, non smoker  Review of Systems  Musculoskeletal: Positive for joint pain.  All other systems reviewed and are negative.   PHYSICAL EXAMINATION:    BP 112/82 mmHg  Pulse 80  Resp 16  Ht 5\' 5"  (1.651 m)  Wt 148 lb (67.132 kg)  BMI 24.63 kg/m2  LMP 05/20/2015    Physical Exam  Constitutional: She is oriented to person, place, and time. She appears well-developed and well-nourished.  Neurological: She is alert and oriented to person, place, and time.  Skin: Skin is warm and dry.  Psychiatric: She has a normal mood and  affect.     Chaperone was present for exam.  Assessment: Joint pain that pt feels is related to menopause Mirena IUD placed 10/12, due for removal 10/17.  Pt aware.  Plan: Start estradiol 1.0mg  daily.  #30/1RF. Repeat [place] today. Pt will return for follow up one month.  May need to consider cymbalta instead of HRT if this doesn't help much.   ~15 minutes spent with patient >50% of time was in face to face discussion of above.

## 2015-06-21 ENCOUNTER — Ambulatory Visit (INDEPENDENT_AMBULATORY_CARE_PROVIDER_SITE_OTHER): Payer: Managed Care, Other (non HMO) | Admitting: Obstetrics & Gynecology

## 2015-06-21 ENCOUNTER — Encounter: Payer: Self-pay | Admitting: Obstetrics & Gynecology

## 2015-06-21 VITALS — BP 102/62 | HR 64 | Resp 14

## 2015-06-21 DIAGNOSIS — Z975 Presence of (intrauterine) contraceptive device: Secondary | ICD-10-CM

## 2015-06-21 DIAGNOSIS — R52 Pain, unspecified: Secondary | ICD-10-CM | POA: Diagnosis not present

## 2015-06-21 MED ORDER — MISOPROSTOL 100 MCG PO TABS: ORAL_TABLET | ORAL | Status: DC

## 2015-06-21 MED ORDER — ESTRADIOL 1 MG PO TABS: 1.0000 mg | ORAL_TABLET | Freq: Every day | ORAL | Status: DC

## 2015-06-21 NOTE — Progress Notes (Signed)
GYNECOLOGY  VISIT   HPI: 55 y.o. G46P2002 Married Caucasian female her for follow-up after starting 05/21/15.  Pt reports she feels so much better.  Specifically, her joint pain is much improved.  At day of last visit, [facility] was drawn.  Level was 14.7.  Advised pt that I am a little surprised this has helped so much.  She did have a short cycle in early April but hasn't cycled, except for that one time, in five or six months.  It could be that the [facility] was lower than I would expect due to being close to a cycle.  [place]ola fluctuations discussed.  Pt aware, she might cycle again and she needs to let me know if this happens.    Pt questions decreasing the estradiol to 1/2 tab daily.  She and I discussed pros/cons of doing this.  I would advise, right now, to just stay where she is.  We can discuss this at her AEX in July, again.  Pt using Mirena IUD for contraception and for progesterone with HRT use.  She is due removal 10/17.  As [place] was 14, I feel it is best to continue this use instead of switching to progesterone at this time.  Pt in agreement.  Will precert and can remove current IUD and replace with new Mirena at AEX.  Likely, this will be the last one she needs and then will transition or oral progesterone.  Pt comfortable with plan.  GYNECOLOGIC HISTORY: Patient's last menstrual period was 05/21/2015. Contraception: IUD Menopausal hormone therapy: estradiol 1.0mg  daily  Patient Active Problem List   Diagnosis Date Noted  . Body aches 05/22/2015  . Fibroids, intramural 02/06/2015  . IUD (intrauterine device) in place 02/06/2015  . Other malaise and fatigue 09/27/2013  . Elevated glucose 09/27/2013    Past Medical History  Diagnosis Date  . Depression   . Anxiety   . Asthma   . Reflux   . Gestational diabetes   . Anemia   . Pneumonia 1987  . Single cyst of left breast 02/01/12    Diagnostic mammogram and ultrasound - cyst at 2:30    Past Surgical History  Procedure Laterality Date  .  Cesarean section  1993, 1995    MEDS:  Reviewed in EPIC and UTD  ALLERGIES: Shellfish allergy and Penicillins  Family History  Problem Relation Age of Onset  . Breast cancer Mother 95  . Depression Mother   . Anxiety disorder Mother   . Osteoporosis Mother   . Depression Sister   . Anxiety disorder Sister   . Stroke Father     died 02/21/2012  . Dementia Father   . Anxiety disorder Brother   . Depression Brother   . Dystonia Brother     cervical  . Thyroid disease Mother     on synthroid    SH:  Married, non smoker  Review of Systems  All other systems reviewed and are negative.   PHYSICAL EXAMINATION:    BP 102/62 mmHg  Pulse 64  Resp 14  Wt   LMP 05/21/2015     Physical Exam  Constitutional: She is oriented to person, place, and time. She appears well-developed and well-nourished.  Neurological: She is alert and oriented to person, place, and time.  Psychiatric: She has a normal mood and affect.   No other physical exam was performed  Assessment: Improved joint pain complaints, on 1.0mg  estradiol daily Mirena IUD use with FSH of 14, 4/17  Plan: Estradiol  1.0mg  daily.   Plan to remove and replace IUD Mirena in July.  Will precet this. Cytotec rx to pharmacy for before the procedure as pt used this last time and procedure when quite well.   ~15 minutes spent with patient >50% of time was in face to face discussion of above.

## 2015-06-26 ENCOUNTER — Other Ambulatory Visit: Payer: Self-pay | Admitting: Obstetrics & Gynecology

## 2015-06-26 DIAGNOSIS — Z30014 Encounter for initial prescription of intrauterine contraceptive device: Secondary | ICD-10-CM

## 2015-08-16 ENCOUNTER — Ambulatory Visit: Payer: Managed Care, Other (non HMO) | Admitting: Obstetrics & Gynecology

## 2015-08-19 ENCOUNTER — Encounter: Payer: Self-pay | Admitting: Obstetrics & Gynecology

## 2015-08-19 ENCOUNTER — Ambulatory Visit (INDEPENDENT_AMBULATORY_CARE_PROVIDER_SITE_OTHER): Payer: Managed Care, Other (non HMO) | Admitting: Obstetrics & Gynecology

## 2015-08-19 VITALS — BP 114/70 | HR 76 | Resp 14 | Ht 65.0 in | Wt 156.2 lb

## 2015-08-19 DIAGNOSIS — Z30433 Encounter for removal and reinsertion of intrauterine contraceptive device: Secondary | ICD-10-CM

## 2015-08-19 DIAGNOSIS — Z01419 Encounter for gynecological examination (general) (routine) without abnormal findings: Secondary | ICD-10-CM

## 2015-08-19 DIAGNOSIS — Z205 Contact with and (suspected) exposure to viral hepatitis: Secondary | ICD-10-CM | POA: Diagnosis not present

## 2015-08-19 DIAGNOSIS — Z0000 Encounter for general adult medical examination without abnormal findings: Secondary | ICD-10-CM

## 2015-08-19 DIAGNOSIS — Z30014 Encounter for initial prescription of intrauterine contraceptive device: Secondary | ICD-10-CM | POA: Diagnosis not present

## 2015-08-19 HISTORY — PX: INTRAUTERINE DEVICE (IUD) INSERTION: SHX5877

## 2015-08-19 LAB — POCT URINALYSIS DIPSTICK
Bilirubin, UA: NEGATIVE
Blood, UA: NEGATIVE
Glucose, UA: NEGATIVE
KETONES UA: NEGATIVE
Leukocytes, UA: NEGATIVE
Nitrite, UA: NEGATIVE
PH UA: 5
PROTEIN UA: NEGATIVE
Urobilinogen, UA: NEGATIVE

## 2015-08-19 MED ORDER — ESTRADIOL 1 MG PO TABS: 1.0000 mg | ORAL_TABLET | Freq: Every day | ORAL | Status: DC

## 2015-08-19 NOTE — Progress Notes (Signed)
55 y.o. G103P2002 Married Caucasian F here for annual exam.  Doing well.  Denies vaginal bleeding.  Had [facility] obtained in April showing premenopausal range, still.  Using Mirena IUD was estradiol and desires to continue using this method.  Will plan removal and replacement today.  Reports she feels much better on the estradiol today.  She cannot believe how much better she feels.  Clearly aware of risks and desires to continue HRT.  LMP: IUD        Sexually active: Yes.    The current method of family planning is IUD.    Exercising: Yes.    walking Smoker:  no  Health Maintenance: Pap:  06/01/2014 negative, neg HR HPV 4/14 History of abnormal Pap:  no MMG:  07/22/2015 BIRADS 1 negative  Colonoscopy:  11/09/2012 polyps BMD:   01/10/2012 normal per patient  TDaP: 2014  Pneumonia vaccine(s):  never Zostavax:   never Hep C testing: obtained Screening Labs: PCP, Hb today: PCP, Urine today: normal     reports that she has never smoked. She has never used smokeless tobacco. She reports that she drinks alcohol. She reports that she does not use illicit drugs.  Past Medical History  Diagnosis Date  . Depression   . Anxiety   . Asthma   . Reflux   . Gestational diabetes   . Anemia   . Pneumonia 1987  . Single cyst of left breast 02/01/12    Diagnostic mammogram and ultrasound - cyst at 2:30    Past Surgical History  Procedure Laterality Date  . Cesarean section  1993, 1995    Family History  Problem Relation Age of Onset  . Breast cancer Mother 60  . Depression Mother   . Anxiety disorder Mother   . Osteoporosis Mother   . Depression Sister   . Anxiety disorder Sister   . Stroke Father     died 13-Mar-2012  . Dementia Father   . Anxiety disorder Brother   . Depression Brother   . Dystonia Brother     cervical  . Thyroid disease Mother     on synthroid   Review of Systems  All other systems reviewed and are negative.    Exam:   Filed Vitals:   08/19/15 1526  BP: 114/70   Pulse: 76  Resp: 14  Height: 5\' 5"  (1.651 m)  Weight: 156 lb 3.2 oz (70.852 kg)   General appearance: alert, cooperative and appears stated age Head: Normocephalic, without obvious abnormality, atraumatic Neck: no adenopathy, supple, symmetrical, trachea midline and thyroid normal to inspection and palpation Lungs: clear to auscultation bilaterally Breasts: normal appearance, no masses or tenderness Heart: regular rate and rhythm Abdomen: soft, non-tender; bowel sounds normal; no masses,  no organomegaly Extremities: extremities normal, atraumatic, no cyanosis or edema Skin: Skin color, texture, turgor normal. No rashes or lesions Lymph nodes: Cervical, supraclavicular, and axillary nodes normal. No abnormal inguinal nodes palpated Neurologic: Grossly normal   Pelvic: External genitalia:  no lesions              Urethra:  normal appearing urethra with no masses, tenderness or lesions              Bartholins and Skenes: normal                 Vagina: normal appearing vagina with normal color and discharge, no lesions              Cervix: no lesions  Pap taken: No. Bimanual Exam:  Uterus:  normal size, contour, position, consistency, mobility, non-tender              Adnexa: normal adnexa and no mass, fullness, tenderness               Rectovaginal: Confirms               Anus:  normal sphincter tone, no lesions  Procedure:  Speculum reinserted.  Cervix visualized and cleansed with Hibiclens x 3.  No paracervical block was placed.  Single toothed tenaculum applied to anterior lip of cervix without difficulty.  IUD string noted and grasped with ringed forcep.  With one pull, IUD removed easily.  Pt tolerated this well.  Then uterus sounded to 9cm.  Lot number: [id number].  Expiration:  1/20.  IUD package was opened.  IUD and introducer passed to fundus and then withdrawn slightly before IUD was passed into endometrial cavity.  Introducer removed.  Strings cut to 2cm.  Tenaculum  removed from cervix.  Minimal bleeding noted.  Pt tolerated the procedure well.  All instruments removed from vagina.  IUD card given to pt.  She is aware IUD removal due no later than 08/2020.   A:  Well Woman with normal exam  Mirena IUD for [facility], removed and replaced today On estradiol this year for body aches that has really helped symptoms Family hx of breast cancer in mother age 70  P: Mammogram yearly. D/W pt 3D due to breast density. Neg pap with neg HR HPV 4/14. Neg pap 2016.  No pap today. Estradiol 1.0mg  daily.  #90/4RF rx to pharmacy. Hep C antibody obtained today. Labs with PCP planned later this summer. Return annually or prn

## 2015-08-20 LAB — HEPATITIS C ANTIBODY: HCV AB: NEGATIVE

## 2015-08-20 NOTE — Addendum Note (Signed)
Addended by: Megan Salon on: 08/20/2015 08:33 AM   Modules accepted: Miquel Dunn

## 2015-08-29 ENCOUNTER — Ambulatory Visit (INDEPENDENT_AMBULATORY_CARE_PROVIDER_SITE_OTHER): Payer: Managed Care, Other (non HMO) | Admitting: Allergy and Immunology

## 2015-08-29 VITALS — BP 122/80 | HR 76 | Resp 16

## 2015-08-29 DIAGNOSIS — J387 Other diseases of larynx: Secondary | ICD-10-CM | POA: Diagnosis not present

## 2015-08-29 DIAGNOSIS — B370 Candidal stomatitis: Secondary | ICD-10-CM | POA: Diagnosis not present

## 2015-08-29 DIAGNOSIS — J309 Allergic rhinitis, unspecified: Secondary | ICD-10-CM | POA: Diagnosis not present

## 2015-08-29 DIAGNOSIS — J4540 Moderate persistent asthma, uncomplicated: Secondary | ICD-10-CM

## 2015-08-29 DIAGNOSIS — H1010 Acute atopic conjunctivitis, unspecified eye: Secondary | ICD-10-CM

## 2015-08-29 DIAGNOSIS — K219 Gastro-esophageal reflux disease without esophagitis: Secondary | ICD-10-CM

## 2015-08-29 MED ORDER — BUDESONIDE-FORMOTEROL FUMARATE 160-4.5 MCG/ACT IN AERO: 2.0000 | INHALATION_SPRAY | Freq: Two times a day (BID) | RESPIRATORY_TRACT | Status: DC

## 2015-08-29 MED ORDER — FLUCONAZOLE 150 MG PO TABS: ORAL_TABLET | ORAL | Status: DC

## 2015-08-29 MED ORDER — BECLOMETHASONE DIPROPIONATE 80 MCG/ACT IN AERS: 3.0000 | INHALATION_SPRAY | Freq: Three times a day (TID) | RESPIRATORY_TRACT | Status: DC

## 2015-08-29 MED ORDER — EPINEPHRINE 0.3 MG/0.3ML IJ SOAJ: 0.3000 mg | Freq: Once | INTRAMUSCULAR | Status: DC

## 2015-08-29 MED ORDER — DEXLANSOPRAZOLE 60 MG PO CPDR: 60.0000 mg | DELAYED_RELEASE_CAPSULE | Freq: Every day | ORAL | Status: DC

## 2015-08-29 MED ORDER — ALBUTEROL SULFATE HFA 108 (90 BASE) MCG/ACT IN AERS: 2.0000 | INHALATION_SPRAY | RESPIRATORY_TRACT | Status: DC | PRN

## 2015-08-29 NOTE — Patient Instructions (Signed)
  1. Continue Symbicort 160 2 inhalations twice a day  2. Continue addition of Qvar 83 inhalations 3 times per day to Symbicort during "flareup"  3. Continue Dexilant 60 mg one tablet one time per day  4. Continue OTC Rhinocort one spray shot also once a day if needed  5. Continue ProAir HFA 2 puffs every 4-6 hours if needed  6. Continue Zyrtec if needed  7. Continue EpiPen if needed  8. Diflucan 150 mg tablet today  9. Obtain fall flu vaccine  10. Return to clinic in 1 year or earlier if problem

## 2015-08-29 NOTE — Progress Notes (Signed)
Follow-up Note  Referring Provider: Myer Peer, MD Primary Provider: Ann Held, MD Date of Office Visit: 08/29/2015  Subjective:   Allison Soto (DOB: December 10, 1960) is a 55 y.o. female who returns to the [place] on 08/29/2015 in re-evaluation of the following:  HPI: Allison Soto returns to this clinic in reevaluation of her asthma and allergic rhinoconjunctivitis and LPR and food allergy. I've not seen her in his clinic in 1 year.  During the interval she has done quite well with her asthma and has not had an exacerbation requiring a systemic steroid and can exercise without any difficulty although she does not exercise very much and she does not use a bronchodilator very often. She has not had to activate her action plan including addition of Qvar. She has noticed that she's been a little bit raspy lately and has an irritated throat which usually signifies that she has developed thrush. Usually she gets treated for thrush once or twice a year with a Diflucan tablet.  Her nose has been doing quite well and she has no need to use any nasal steroid at this point.  Her reflux has been under very good control while using her Dexilant  She remains away from shellfish    Medication List           budesonide-formoterol 160-4.5 MCG/ACT inhaler  Commonly known as:  SYMBICORT  Inhale 2 puffs into the lungs 2 (two) times daily. Reported on 05/21/2015     DEXILANT PO  Take by mouth.     estradiol 1 MG tablet  Commonly known as:  ESTRACE  Take 1 tablet (1 mg total) by mouth daily.     fluvoxaMINE 100 MG tablet  Commonly known as:  LUVOX  Take 100 mg by mouth at bedtime. Taking 150mg  daily     levonorgestrel 20 MCG/24HR IUD  Commonly known as:  MIRENA  1 each by Intrauterine route once. Place 12/08/2010.     RHINOCORT AQUA 32 MCG/ACT nasal spray  Generic drug:  budesonide  as needed.     traZODone 50 MG tablet  Commonly known as:  DESYREL  Take 50 mg  by mouth at bedtime. Patient takes 84.5. mg po q hs.     VITAMIN D PO  Take 1,000 Int'l Units by mouth daily.     ZYRTEC PO  Take by mouth.        Past Medical History  Diagnosis Date  . Depression   . Anxiety   . Asthma   . Reflux   . Gestational diabetes   . Anemia   . Pneumonia 1987  . Single cyst of left breast 02/01/12    Diagnostic mammogram and ultrasound - cyst at 2:30    Past Surgical History  Procedure Laterality Date  . Cesarean section  1993, 1995    Allergies  Allergen Reactions  . Shellfish Allergy Anaphylaxis    Throat closes.  . Penicillins Other (See Comments)    Mouth sores.    Review of systems negative except as noted in HPI / PMHx or noted below:  Review of Systems  Constitutional: Negative.   HENT: Negative.   Eyes: Negative.   Respiratory: Negative.   Cardiovascular: Negative.   Gastrointestinal: Negative.   Genitourinary: Negative.   Musculoskeletal: Negative.   Skin: Negative.   Neurological: Negative.   Endo/Heme/Allergies: Negative.   Psychiatric/Behavioral: Negative.      Objective:   Filed Vitals:   08/29/15 1536  BP:  122/80  Pulse: 76  Resp: 16          Physical Exam  Constitutional: She is well-developed, well-nourished, and in no distress.  HENT:  Head: Normocephalic.  Right Ear: Tympanic membrane, external ear and ear canal normal.  Left Ear: Tympanic membrane, external ear and ear canal normal.  Nose: Nose normal. No mucosal edema or rhinorrhea.  Mouth/Throat: Uvula is midline, oropharynx is clear and moist and mucous membranes are normal. No oropharyngeal exudate.  Eyes: Conjunctivae are normal.  Neck: Trachea normal. No tracheal tenderness present. No tracheal deviation present. No thyromegaly present.  Cardiovascular: Normal rate, regular rhythm, S1 normal, S2 normal and normal heart sounds.   No murmur heard. Pulmonary/Chest: Breath sounds normal. No stridor. No respiratory distress. She has no  wheezes. She has no rales.  Musculoskeletal: She exhibits no edema.  Lymphadenopathy:       Head (right side): No tonsillar adenopathy present.       Head (left side): No tonsillar adenopathy present.    She has no cervical adenopathy.  Neurological: She is alert. Gait normal.  Skin: No rash noted. She is not diaphoretic. No erythema. Nails show no clubbing.  Psychiatric: Mood and affect normal.    Diagnostics:    Spirometry was performed and demonstrated an FEV1 of 2.32 at 84 % of predicted.  The patient had an Asthma Control Test with the following results: ACT Total Score: 24.    Assessment and Plan:   1. Asthma, moderate persistent, well-controlled   2. Allergic rhinoconjunctivitis   3. LPRD (laryngopharyngeal reflux disease)   4. Thrush     1. Continue Symbicort 160 2 inhalations twice a day  2. Continue addition of Qvar 83 inhalations 3 times per day to Symbicort during "flareup"  3. Continue Dexilant 60 mg one tablet one time per day  4. Continue OTC Rhinocort one spray shot also once a day if needed  5. Continue ProAir HFA 2 puffs every 4-6 hours if needed  6. Continue Zyrtec if needed  7. Continue EpiPen if needed  8. Diflucan 150 mg tablet today  9. Obtain fall flu vaccine  10. Return to clinic in 1 year or earlier if problem  Overall Allison Soto is done quite well over the course of the past year and we'll continue to have her use the plan mentioned above. I will give her a single Diflucan tablet to treat what may be fungal overgrowth in her airways secondary to her Symbicort. This has worked well in the past. Of course, this may be secondary to reflux and if she doesn't respond adequately then we may need to have her undergo further evaluation and treatment looking for other etiologic factors contributing to this issue. I've encouraged her to get a flu vaccine this fall and will see her back in this clinic in 1 year or earlier if there is a problem.  Allena Katz, MD [place]

## 2015-10-22 ENCOUNTER — Ambulatory Visit (INDEPENDENT_AMBULATORY_CARE_PROVIDER_SITE_OTHER): Payer: Managed Care, Other (non HMO) | Admitting: Obstetrics & Gynecology

## 2015-10-22 ENCOUNTER — Encounter: Payer: Self-pay | Admitting: Obstetrics & Gynecology

## 2015-10-22 VITALS — BP 120/76 | HR 88 | Resp 14 | Ht 65.0 in | Wt 157.0 lb

## 2015-10-22 DIAGNOSIS — Z975 Presence of (intrauterine) contraceptive device: Secondary | ICD-10-CM

## 2015-10-22 DIAGNOSIS — Z30431 Encounter for routine checking of intrauterine contraceptive device: Secondary | ICD-10-CM

## 2015-10-22 NOTE — Progress Notes (Signed)
55 y.o. G55P2002 Married Caucasian female presents for followed up after insertion of Mirena IUD on 08/19/15.  Pt reports she is doing well and does not have any complaints.  Has not had any bleeding.  Feels great.  LMP:  No LMP recorded. Patient is not currently having periods (Reason: IUD).  Patient Active Problem List   Diagnosis Date Noted  . Body aches 05/22/2015  . Fibroids, intramural 02/06/2015  . IUD (intrauterine device) in place 02/06/2015  . Other malaise and fatigue 09/27/2013  . Elevated glucose 09/27/2013   Past Medical History:  Diagnosis Date  . Anemia   . Anxiety   . Asthma   . Depression   . Gestational diabetes   . Pneumonia 1987  . Reflux   . Single cyst of left breast 02/01/12   Diagnostic mammogram and ultrasound - cyst at 2:30   Current Outpatient Prescriptions on File Prior to Visit  Medication Sig Dispense Refill  . albuterol (PROAIR HFA) 108 (90 Base) MCG/ACT inhaler Inhale 2 puffs into the lungs every 4 (four) hours as needed for wheezing or shortness of breath. 1 Inhaler 3  . beclomethasone (QVAR) 80 MCG/ACT inhaler Inhale 3 puffs into the lungs 3 (three) times daily. 1 Inhaler 3  . budesonide-formoterol (SYMBICORT) 160-4.5 MCG/ACT inhaler Inhale 2 puffs into the lungs 2 (two) times daily. Reported on 05/21/2015 1 Inhaler 11  . Cholecalciferol (VITAMIN D PO) Take 1,000 Int'l Units by mouth daily.    Marland Kitchen dexlansoprazole (DEXILANT) 60 MG capsule Take 1 capsule (60 mg total) by mouth daily. 30 capsule 5  . EPINEPHrine (EPIPEN 2-PAK) 0.3 mg/0.3 mL IJ SOAJ injection Inject 0.3 mLs (0.3 mg total) into the muscle once. 2 Device 2  . estradiol (ESTRACE) 1 MG tablet Take 1 tablet (1 mg total) by mouth daily. 90 tablet 4  . fluconazole (DIFLUCAN) 150 MG tablet Take one tablet today 1 tablet 0  . fluvoxaMINE (LUVOX) 100 MG tablet Take 100 mg by mouth at bedtime. Taking 150mg  daily    . levonorgestrel (MIRENA) 20 MCG/24HR IUD 1 each by Intrauterine route once. Place  12/08/2010.    Marland Kitchen RHINOCORT AQUA 32 MCG/ACT nasal spray as needed.  1  . traZODone (DESYREL) 50 MG tablet Take 50 mg by mouth at bedtime. Patient takes 12.5. mg po q hs.     No current facility-administered medications on file prior to visit.    Shellfish allergy and Penicillins  Review of Systems  All other systems reviewed and are negative.  Vitals:   10/22/15 1552  BP: 120/76  Pulse: 88  Resp: 14  Weight: 157 lb (71.2 kg)  Height: 5\' 5"  (1.651 m)    Gen:  WNWF healthy female NAD Abdomen: soft, non-tender Groin:  no inguinal nodes palpated  Pelvic exam: Vulva:  normal female genitalia Vagina:  normal vagina Cervix:  Non-tender, Negative CMT, no lesions or redness.  IUD strings noted and are about 1 cm.   Uterus:  normal shape, position and consistency    A: IUD follow-up after insertion of Mirena IUD on 08/19/15  P:  Follow-up for AEX. Pt knows to call with any new concerns or problems.

## 2016-05-25 ENCOUNTER — Other Ambulatory Visit: Payer: Self-pay | Admitting: *Deleted

## 2016-05-25 MED ORDER — DEXLANSOPRAZOLE 60 MG PO CPDR: DELAYED_RELEASE_CAPSULE | ORAL | 3 refills | Status: DC

## 2016-07-27 ENCOUNTER — Encounter: Payer: Self-pay | Admitting: Nurse Practitioner

## 2016-07-27 ENCOUNTER — Ambulatory Visit (INDEPENDENT_AMBULATORY_CARE_PROVIDER_SITE_OTHER): Payer: 59 | Admitting: Nurse Practitioner

## 2016-07-27 VITALS — BP 100/64 | HR 80 | Temp 98.2°F | Wt 153.0 lb

## 2016-07-27 DIAGNOSIS — R3915 Urgency of urination: Secondary | ICD-10-CM | POA: Diagnosis not present

## 2016-07-27 DIAGNOSIS — R350 Frequency of micturition: Secondary | ICD-10-CM | POA: Diagnosis not present

## 2016-07-27 DIAGNOSIS — R300 Dysuria: Secondary | ICD-10-CM | POA: Diagnosis not present

## 2016-07-27 LAB — POCT URINALYSIS DIPSTICK
BILIRUBIN UA: NEGATIVE
Glucose, UA: NEGATIVE
Ketones, UA: NEGATIVE
Leukocytes, UA: NEGATIVE
NITRITE UA: NEGATIVE
PH UA: 5 (ref 5.0–8.0)
Protein, UA: NEGATIVE
RBC UA: NEGATIVE
Urobilinogen, UA: 0.2 E.U./dL

## 2016-07-27 NOTE — Progress Notes (Signed)
Patient ID: Allison Soto, female   DOB: [birth year], 56 y.o.   MRN: [id number]  56 y.o.Married Caucasian female G2P2002 here with complaint of UTI, with onset last week. Patient complaining of:  dysuria, urinary frequency, urinary urgency and abdominal pain. Patient denies fever, chills, nausea or back pain. No new personal products. Patient feels not related to sexual activity. Denies vaginal symptoms.    Contraception is IUD and abstinence.  No change in partner.  Menopausal with no vaginal dryness. Patient is getting adequate water intake.  She feels this abdominal pressure and urine symptoms may also be due to starting a menses.  Prior spotting 05/20/15 with new insertion of IUD 08/2015.  Then first of April spotting for 2 days.  Feels like going to start again.  Past Medical History:  Diagnosis Date  . Anemia   . Anxiety   . Asthma   . Depression   . Gestational diabetes   . Pneumonia 1987  . Reflux   . Single cyst of left breast 02/01/12   Diagnostic mammogram and ultrasound - cyst at 2:30    Past Surgical History:  Procedure Laterality Date  . [place]  . INTRAUTERINE DEVICE (IUD) INSERTION  08/19/2015   Mirena    Current Outpatient Prescriptions  Medication Sig Dispense Refill  . budesonide-formoterol (SYMBICORT) 160-4.5 MCG/ACT inhaler Inhale 2 puffs into the lungs 2 (two) times daily. Reported on 05/21/2015 1 Inhaler 11  . Cholecalciferol (VITAMIN D-3) 1000 units CAPS Take 1 capsule by mouth daily.    Marland Kitchen dexlansoprazole (DEXILANT) 60 MG capsule Take one capsule every morning 30 capsule 3  . EPINEPHrine (EPIPEN 2-PAK) 0.3 mg/0.3 mL IJ SOAJ injection Inject 0.3 mLs (0.3 mg total) into the muscle once. 2 Device 2  . estradiol (ESTRACE) 1 MG tablet Take 1 tablet (1 mg total) by mouth daily. 90 tablet 4  . fluconazole (DIFLUCAN) 150 MG tablet Take one tablet today 1 tablet 0  . fluvoxaMINE (LUVOX) 100 MG tablet Take 100 mg by mouth at bedtime. Taking 150mg  daily     . levonorgestrel (MIRENA) 20 MCG/24HR IUD 1 each by Intrauterine route once. Place 12/08/2010.    Marland Kitchen RHINOCORT AQUA 32 MCG/ACT nasal spray as needed.  1  . traZODone (DESYREL) 50 MG tablet Take 50 mg by mouth at bedtime. Patient takes 39.5. mg po q hs.    . albuterol (PROAIR HFA) 108 (90 Base) MCG/ACT inhaler Inhale 2 puffs into the lungs every 4 (four) hours as needed for wheezing or shortness of breath. (Patient not taking: Reported on 07/27/2016) 1 Inhaler 3   No current facility-administered medications for this visit.     ALLERGIES: Shellfish allergy and Penicillins  PHYSICAL EXAMINATION:  BP 100/64 (BP Location: Right Arm, Patient Position: Sitting, Cuff Size: Normal)   Pulse 80   Temp 98.2 F (36.8 C) (Oral)   Wt 153 lb (69.4 kg)   LMP 05/10/2016 (Approximate)   BMI 25.46 kg/m  Healthy female WDWN Affect: Normal, orientation x 3 Skin : warm and dry CVAT: negative bilateral Abdomen: negative for suprapubic tenderness  Pelvic exam: External genital area: normal, no lesions Bladder,Urethra tender, Urethral meatus: normal Vagina: light brown vaginal discharge, normal appearance Cervix: normal, non tender with IUD string visible Uterus:normal,non tender Adnexa: normal non tender, no fullness or masses  POCT:  Urine:  negative  A:  R/O UTI  History of Mirena IUD 08/2015 and Estradiol  Pelvic exam with light brown spotting  P:  Reviewed findings  of UTI and need for treatment.  Rx:  Hold any treatment pending urine C&S GIT:JLLVD culture Reviewed warning signs and symptoms of UTI and need to advise if occurring. Encouraged to limit soda, tea, and coffee   RV prn

## 2016-07-27 NOTE — Patient Instructions (Signed)
Will follow with urine C&S

## 2016-07-27 NOTE — Progress Notes (Signed)
Reviewed personally.  M. Suzanne Damontre Millea, MD.  

## 2016-07-28 LAB — URINE CULTURE: Organism ID, Bacteria: NO GROWTH

## 2016-08-13 ENCOUNTER — Encounter: Payer: Self-pay | Admitting: Obstetrics & Gynecology

## 2016-08-21 ENCOUNTER — Other Ambulatory Visit: Payer: Self-pay | Admitting: *Deleted

## 2016-08-21 MED ORDER — ESTRADIOL 1 MG PO TABS: 1.0000 mg | ORAL_TABLET | Freq: Every day | ORAL | 1 refills | Status: DC

## 2016-08-21 NOTE — Telephone Encounter (Signed)
Faxed refill request received from [facility] for [place] filled by MD on 08/19/15, #90 X 4 Last AEX - 08/19/15 SM Next AEX - 01/25/17 SM Last MMG - 6//12/18, Bi-Rads 2:  Benign, repeat in one year  Please advise refills today. #90 x 1?

## 2017-01-25 ENCOUNTER — Ambulatory Visit: Payer: Managed Care, Other (non HMO) | Admitting: Obstetrics & Gynecology

## 2017-02-05 ENCOUNTER — Encounter: Payer: Self-pay | Admitting: Obstetrics & Gynecology

## 2017-02-05 ENCOUNTER — Other Ambulatory Visit (HOSPITAL_COMMUNITY)
Admission: RE | Admit: 2017-02-05 | Discharge: 2017-02-05 | Disposition: A | Discharge status: Home / Self Care | Payer: 59 | Source: Ambulatory Visit | Attending: Obstetrics & Gynecology | Admitting: Obstetrics & Gynecology

## 2017-02-05 ENCOUNTER — Ambulatory Visit (INDEPENDENT_AMBULATORY_CARE_PROVIDER_SITE_OTHER): Payer: 59 | Admitting: Obstetrics & Gynecology

## 2017-02-05 VITALS — BP 106/70 | HR 76 | Resp 16 | Ht 64.75 in | Wt 152.0 lb

## 2017-02-05 DIAGNOSIS — N912 Amenorrhea, unspecified: Secondary | ICD-10-CM | POA: Diagnosis not present

## 2017-02-05 DIAGNOSIS — Z124 Encounter for screening for malignant neoplasm of cervix: Secondary | ICD-10-CM | POA: Insufficient documentation

## 2017-02-05 DIAGNOSIS — Z01419 Encounter for gynecological examination (general) (routine) without abnormal findings: Secondary | ICD-10-CM | POA: Diagnosis not present

## 2017-02-05 MED ORDER — ESTRADIOL 1 MG PO TABS: 1.0000 mg | ORAL_TABLET | Freq: Every day | ORAL | 4 refills | Status: DC

## 2017-02-05 NOTE — Progress Notes (Signed)
56 y.o. [id number] MarriedCaucasianF here for annual exam.  Doing well.  Had a little bit of spotting in December.  IUD replaced in July, 2017.    PCP:  Dr. Nicki Reaper.  Having blood work every six months.  Followed for HbA1C and cholesterol.  Reports she took a lot of oral iron and level got too high.    Patient's last menstrual period was 01/23/2017.          Sexually active: Yes.    The current method of family planning is Mirena IUD placed 08/19/15.    Exercising: No.   Smoker:  no  Health Maintenance: Pap:  11/01/14 Neg   05/13/12 Neg. HR HPV:neg  History of abnormal Pap:  no MMG:  07/21/16 BIRADS2:benign  Colonoscopy:  11/09/12 Polyps. F/u 10 years.  BMD:   01/2012 Normal  TDaP:  2014 Pneumonia vaccine(s):  No Zostavax:   No Hep C testing: 08/19/15 neg  Screening Labs: PCP   reports that  has never smoked. she has never used smokeless tobacco. She reports that she drinks alcohol. She reports that she does not use drugs.  Past Medical History:  Diagnosis Date  . Anemia   . Anxiety   . Asthma   . Depression   . Gestational diabetes   . Pneumonia 1987  . Reflux   . Single cyst of left breast 02/01/12   Diagnostic mammogram and ultrasound - cyst at 2:30    Past Surgical History:  Procedure Laterality Date  . [place]  . INTRAUTERINE DEVICE (IUD) INSERTION  08/19/2015   Mirena    Current Outpatient Medications  Medication Sig Dispense Refill  . albuterol (PROAIR HFA) 108 (90 Base) MCG/ACT inhaler Inhale 2 puffs into the lungs every 4 (four) hours as needed for wheezing or shortness of breath. 1 Inhaler 3  . budesonide-formoterol (SYMBICORT) 160-4.5 MCG/ACT inhaler Inhale 2 puffs into the lungs 2 (two) times daily. Reported on 05/21/2015 1 Inhaler 11  . cetirizine (ZYRTEC) 10 MG tablet Take by mouth.    . Cholecalciferol (VITAMIN D-3) 1000 units CAPS Take 1 capsule by mouth daily.    . CRESTOR 5 MG tablet Take 5 mg by mouth every evening.  12  . dexlansoprazole  (DEXILANT) 60 MG capsule Take one capsule every morning 30 capsule 3  . estradiol (ESTRACE) 1 MG tablet Take 1 tablet (1 mg total) by mouth daily. 90 tablet 1  . fluvoxaMINE (LUVOX) 100 MG tablet Take 100 mg by mouth at bedtime. Taking 150mg  daily    . levonorgestrel (MIRENA) 20 MCG/24HR IUD 1 each by Intrauterine route once. Place 12/08/2010.    . traZODone (DESYREL) 50 MG tablet Take 50 mg by mouth at bedtime. Patient takes 35.5. mg po q hs.    . EPINEPHrine (EPIPEN 2-PAK) 0.3 mg/0.3 mL IJ SOAJ injection Inject 0.3 mLs (0.3 mg total) into the muscle once. (Patient not taking: Reported on 02/05/2017) 2 Device 2   No current facility-administered medications for this visit.     Family History  Problem Relation Age of Onset  . Breast cancer Mother 26  . Depression Mother   . Anxiety disorder Mother   . Osteoporosis Mother   . Thyroid disease Mother        on synthroid  . Depression Sister   . Anxiety disorder Sister   . Stroke Father        died 02-19-12  . Dementia Father   . Anxiety disorder Brother   .  Depression Brother   . Dystonia Brother        cervical    ROS:  Pertinent items are noted in HPI.  Otherwise, a comprehensive ROS was negative.  Exam:   BP 106/70 (BP Location: Left Arm, Patient Position: Sitting, Cuff Size: Normal)   Pulse 76   Resp 16   Ht 5' 4.75" (1.645 m)   Wt 152 lb (68.9 kg)   LMP 01/23/2017   BMI 25.49 kg/m    Height: 5' 4.75" (164.5 cm)  Ht Readings from Last 3 Encounters:  02/05/17 5' 4.75" (1.645 m)  10/22/15 5\' 5"  (1.651 m)  08/19/15 5\' 5"  (1.651 m)    General appearance: alert, cooperative and appears stated age Head: Normocephalic, without obvious abnormality, atraumatic Neck: no adenopathy, supple, symmetrical, trachea midline and thyroid normal to inspection and palpation Lungs: clear to auscultation bilaterally Breasts: normal appearance, no masses or tenderness Heart: regular rate and rhythm Abdomen: soft, non-tender; bowel  sounds normal; no masses,  no organomegaly Extremities: extremities normal, atraumatic, no cyanosis or edema Skin: Skin color, texture, turgor normal. No rashes or lesions Lymph nodes: Cervical, supraclavicular, and axillary nodes normal. No abnormal inguinal nodes palpated Neurologic: Grossly normal   Pelvic: External genitalia:  no lesions              Urethra:  normal appearing urethra with no masses, tenderness or lesions              Bartholins and Skenes: normal                 Vagina: normal appearing vagina with normal color and discharge, no lesions              Cervix: no lesions and IUD string noted              Pap taken: Yes.   Bimanual Exam:  Uterus:  normal size, contour, position, consistency, mobility, non-tender              Adnexa: normal adnexa and no mass, fullness, tenderness               Rectovaginal: Confirms               Anus:  normal sphincter tone, no lesions  Chaperone was present for exam.  A:  Well Woman with normal exam On estradiol and with MIrena IUD Spotting in mid December, none since Family xh of breast cancer in mother age 30  P:   Mammogram guidelines reviewed.  Doing 3D.  Pt and I did discussed diagnostic ultrasound today as well.  She will consider pap smear with HR HPV obtained RF for estradiol 1mg  daily.  #90/4RF. Mirena IUD place 7/17.   Iron and FSH obtained today.  If [place] low, will watch to see if bleeding occurs again.  If elevated, will have pt return for PUS. Return annually or prn

## 2017-02-06 LAB — FOLLICLE STIMULATING HORMONE: FSH: 17.2 m[IU]/mL

## 2017-02-06 LAB — IRON: Iron: 129 ug/dL (ref 27–159)

## 2017-02-08 ENCOUNTER — Telehealth: Payer: Self-pay | Admitting: *Deleted

## 2017-02-08 NOTE — Telephone Encounter (Signed)
Patient returning your call.

## 2017-02-08 NOTE — Telephone Encounter (Signed)
Notes recorded by Burnice Logan, RN on 02/08/2017 at 12:08 PM EST Left message to call Sharee Pimple at [phone]. See telephone encounter dated 02/08/17.   ------  Notes recorded by Megan Salon, MD on 02/07/2017 at 11:06 PM EST Please let pt know her [facility] was not in full menopause range. Iron level was 129 and range is 27-159. Does not need additional evaluation of this. Due to some bleeding she had in December, I'd like her to return for an ultrasound. Please proceed with scheduling. Thanks.

## 2017-02-08 NOTE — Telephone Encounter (Signed)
Spoke with patient, advised as seen below per Dr. Sabra Heck. PUS scheduled for 02/18/17 at 3:30pm, consult to follow at 4pm with Dr. Sabra Heck. Patient verbalizes understanding and is agreeable.   Routing to provider for final review. Patient is agreeable to disposition. Will close encounter.  Cc: Lerry Liner

## 2017-02-10 LAB — CYTOLOGY - PAP
DIAGNOSIS: NEGATIVE
HPV (WINDOPATH): NOT DETECTED

## 2017-02-17 ENCOUNTER — Other Ambulatory Visit: Payer: Self-pay | Admitting: *Deleted

## 2017-02-17 DIAGNOSIS — N939 Abnormal uterine and vaginal bleeding, unspecified: Secondary | ICD-10-CM

## 2017-02-17 NOTE — Progress Notes (Signed)
trans

## 2017-02-18 ENCOUNTER — Ambulatory Visit (INDEPENDENT_AMBULATORY_CARE_PROVIDER_SITE_OTHER): Payer: 59

## 2017-02-18 ENCOUNTER — Ambulatory Visit (INDEPENDENT_AMBULATORY_CARE_PROVIDER_SITE_OTHER): Payer: 59 | Admitting: Obstetrics & Gynecology

## 2017-02-18 VITALS — BP 112/80 | HR 76 | Resp 14 | Ht 64.75 in | Wt 152.0 lb

## 2017-02-18 DIAGNOSIS — N939 Abnormal uterine and vaginal bleeding, unspecified: Secondary | ICD-10-CM | POA: Diagnosis not present

## 2017-02-18 DIAGNOSIS — Z975 Presence of (intrauterine) contraceptive device: Secondary | ICD-10-CM | POA: Diagnosis not present

## 2017-02-18 DIAGNOSIS — N938 Other specified abnormal uterine and vaginal bleeding: Secondary | ICD-10-CM | POA: Diagnosis not present

## 2017-02-18 MED ORDER — FLUCONAZOLE 150 MG PO TABS: ORAL_TABLET | ORAL | 0 refills | Status: DC

## 2017-02-18 NOTE — Progress Notes (Signed)
GYNECOLOGY  VISIT  CC:   PMP bleeding  HPI: 57 y.o. G41P2002 Married Caucasian female here for evaluation of endometrium due to PMP bleeding.  FSH was obtained after the bleeding episode and was 17.2.  This is consistent with decreased ovarian reserve but not full menopause.  She has been on HRT and has done well with this.  Recomnmended ultrasound for additional evaluation as her [place] is lower than I would expect for a 57 year female on HRT.  Pt here for this today.   Ultrasound results: Uterus:  10 x 7.5 x 6.7cm with 2.9 x 1.9cm, 2.9 x 3.5cm and 3.0 x 3.2cm fibroid (these are larger when compared to prior ultrasound)  Endometrium:  76mm, IUD in correct location Left ovary:  1.8 x 1.0 x 0.6cm Right ovary: 2.6 x 1.6 x 1.4cm Cul de sac: negative for free fluid  GYNECOLOGIC HISTORY: Patient's last menstrual period was 01/23/2017. Contraception: Mirena IUD Menopausal hormone therapy: estradiol 1mg  daily  Patient Active Problem List   Diagnosis Date Noted  . Body aches 05/22/2015  . Fibroids, intramural 02/06/2015  . IUD (intrauterine device) in place 02/06/2015  . Other malaise and fatigue 09/27/2013  . Elevated glucose 09/27/2013    Past Medical History:  Diagnosis Date  . Anemia   . Anxiety   . Asthma   . Depression   . Gestational diabetes   . Pneumonia 1987  . Reflux   . Single cyst of left breast 02/01/12   Diagnostic mammogram and ultrasound - cyst at 2:30    Past Surgical History:  Procedure Laterality Date  . [place]  . INTRAUTERINE DEVICE (IUD) INSERTION  08/19/2015   Mirena    MEDS:   Current Outpatient Medications on File Prior to Visit  Medication Sig Dispense Refill  . albuterol (PROAIR HFA) 108 (90 Base) MCG/ACT inhaler Inhale 2 puffs into the lungs every 4 (four) hours as needed for wheezing or shortness of breath. 1 Inhaler 3  . budesonide-formoterol (SYMBICORT) 160-4.5 MCG/ACT inhaler Inhale 2 puffs into the lungs 2 (two) times  daily. Reported on 05/21/2015 1 Inhaler 11  . cetirizine (ZYRTEC) 10 MG tablet Take by mouth.    . Cholecalciferol (VITAMIN D-3) 1000 units CAPS Take 1 capsule by mouth daily.    . CRESTOR 5 MG tablet Take 5 mg by mouth every evening.  12  . dexlansoprazole (DEXILANT) 60 MG capsule Take one capsule every morning 30 capsule 3  . EPINEPHrine (EPIPEN 2-PAK) 0.3 mg/0.3 mL IJ SOAJ injection Inject 0.3 mLs (0.3 mg total) into the muscle once. (Patient not taking: Reported on 02/05/2017) 2 Device 2  . estradiol (ESTRACE) 1 MG tablet Take 1 tablet (1 mg total) by mouth daily. 90 tablet 4  . fluvoxaMINE (LUVOX) 100 MG tablet Take 100 mg by mouth at bedtime. Taking 150mg  daily    . levonorgestrel (MIRENA) 20 MCG/24HR IUD 1 each by Intrauterine route once. Place 12/08/2010.    . traZODone (DESYREL) 50 MG tablet Take 50 mg by mouth at bedtime. Patient takes 64.5. mg po q hs.     No current facility-administered medications on file prior to visit.     ALLERGIES: Shellfish allergy and Penicillins  Family History  Problem Relation Age of Onset  . Breast cancer Mother 44  . Depression Mother   . Anxiety disorder Mother   . Osteoporosis Mother   . Thyroid disease Mother        on synthroid  . Depression  Sister   . Anxiety disorder Sister   . Stroke Father        died 03-14-2012  . Dementia Father   . Anxiety disorder Brother   . Depression Brother   . Dystonia Brother        cervical    SH:  Married, non smoker  Review of Systems  All other systems reviewed and are negative.   PHYSICAL EXAMINATION:    BP 112/80 (BP Location: Right Arm, Patient Position: Sitting, Cuff Size: Normal)   Pulse 76   Resp 14   Ht 5' 4.75" (1.645 m)   Wt 152 lb (68.9 kg)   LMP 01/23/2017   BMI 25.49 kg/m     General appearance: alert, cooperative and appears stated age Abdomen: soft, non-tender; bowel sounds normal; no masses,  no organomegaly  Pelvic: External genitalia:  no lesions              Urethra:   normal appearing urethra with no masses, tenderness or lesions              Bartholins and Skenes: normal                 Vagina: normal appearing vagina with normal color and discharge, no lesions              Cervix: no lesions              Bimanual Exam:  Uterus:  normal size, contour, position, consistency, mobility, non-tender              Adnexa: no mass, fullness, tenderness   Endometrial biopsy recommended.  Discussed with patient.  Verbal and written consent obtained.   Procedure:  Speculum placed.  Cervix visualized and cleansed with betadine prep.  A single toothed tenaculum was applied to the anterior lip of the cervix.  Endometrial pipelle was advanced through the cervix into the endometrial cavity without difficulty.  Pipelle passed to 7.5cm.  Suction applied and pipelle removed with good tissue sample obtained.  Tenculum removed.  No bleeding noted.  Patient tolerated procedure well.  Chaperone was present for exam.  Assessment: Perimenopausal (?) bleeding Enlarging fibroids  Plan: Endometrial biopsy results pending.  Results will be called to pt and additional recommendations will be made at that time. If biopsy is negative, will plan repeat PUS in 4-6 months.   ~15 minutes spent with patient >50% of time was in face to face discussion of above.

## 2017-02-21 ENCOUNTER — Encounter: Payer: Self-pay | Admitting: Obstetrics & Gynecology

## 2017-08-02 ENCOUNTER — Encounter: Payer: Self-pay | Admitting: Obstetrics & Gynecology

## 2018-02-25 ENCOUNTER — Other Ambulatory Visit: Payer: Self-pay | Admitting: Obstetrics & Gynecology

## 2018-02-25 MED ORDER — ESTRADIOL 1 MG PO TABS: 1.0000 mg | ORAL_TABLET | Freq: Every day | ORAL | 0 refills | Status: DC

## 2018-02-25 NOTE — Telephone Encounter (Signed)
Patient called requesting refills on her Estrodiol to be sent to her new pharmacy: [facility].

## 2018-02-25 NOTE — Telephone Encounter (Signed)
Medication refill request: estrace  Last AEX:  02/05/17 Next AEX: 04/12/18 Last MMG (if hormonal medication request): 07/27/17 Refill authorized: #90 with 0 RF to get her to her AEX  Pharmacy updated

## 2018-03-29 ENCOUNTER — Telehealth: Payer: Self-pay | Admitting: Gastroenterology

## 2018-03-29 NOTE — Telephone Encounter (Signed)
Please review previous message Please advise 

## 2018-03-30 NOTE — Telephone Encounter (Signed)
Allison Soto Can you find out when is the recall from our old system and infrom. Also, add to the new recall log. If any problems, pl print out the last colon report/biopsies and let me know. Thanks RG

## 2018-03-31 NOTE — Telephone Encounter (Signed)
I have put this on your desk for review.  

## 2018-04-03 NOTE — Telephone Encounter (Signed)
Rpt colon due 11/2017 d/t H/O TA  Can schedule with Clenpiq

## 2018-04-04 ENCOUNTER — Encounter: Payer: Self-pay | Admitting: Gastroenterology

## 2018-04-04 NOTE — Telephone Encounter (Signed)
Called and spoke with patient=patient unable to schedule appt for pre visit or repeat colonoscopy at this time; patient reports she will call back to the office to schedule these appts;

## 2018-04-07 NOTE — Telephone Encounter (Signed)
Patient returned call to office and scheduled pre visit on 04/14/2018 apt at 1:00pm and prop colon scheduled on 04/28/2018 appt at1:30pm;

## 2018-04-12 ENCOUNTER — Encounter: Payer: Self-pay | Admitting: Obstetrics & Gynecology

## 2018-04-12 ENCOUNTER — Ambulatory Visit (INDEPENDENT_AMBULATORY_CARE_PROVIDER_SITE_OTHER): Payer: 59 | Admitting: Obstetrics & Gynecology

## 2018-04-12 VITALS — BP 102/62 | HR 76 | Resp 16 | Ht 64.75 in | Wt 129.0 lb

## 2018-04-12 DIAGNOSIS — Z01419 Encounter for gynecological examination (general) (routine) without abnormal findings: Secondary | ICD-10-CM | POA: Diagnosis not present

## 2018-04-12 MED ORDER — ESTRADIOL 1 MG PO TABS: 1.0000 mg | ORAL_TABLET | Freq: Every day | ORAL | 4 refills | Status: DC

## 2018-04-12 NOTE — Progress Notes (Signed)
58 y.o. G79P2002 Married White or Caucasian female here for annual exam.  Has worked on weight loss this past year and has lost over 20 pounds.  She is exercising every day.    Denies vaginal bleeding.    PCP:  Dr. Jeryl Columbia retired.  Does have a new PCP.  Blood work was done in January.  HbA1C was 5.5.    No LMP recorded. (Menstrual status: IUD).          Sexually active: Yes.    The current method of family planning is IUD.   Mirena placed 08/19/15 Exercising: Yes.    walking 2 miles daily  Smoker:  no  Health Maintenance: Pap:  02/05/17 Neg. HR HPV:neg   06/01/14 Neg  History of abnormal Pap:  no MMG:  07/27/17 BIRADS2:benign Colonoscopy:  11/2012 f/u 5 years.  Scheduled in 2 weeks.   BMD:   01/10/12.  Plan to repeat at 53 or 55.   TDaP:  2014 Pneumonia vaccine(s):  D/w pt guidelines.  She would like to get this.   Shingrix:   Had 1 in January Hep C testing: 08/19/15 neg  Screening Labs: PCP   reports that she has never smoked. She has never used smokeless tobacco. She reports current alcohol use. She reports that she does not use drugs.  Past Medical History:  Diagnosis Date  . Anemia   . Anxiety   . Asthma   . Depression   . Gestational diabetes   . Pneumonia 1987  . Reflux   . Single cyst of left breast 02/01/12   Diagnostic mammogram and ultrasound - cyst at 2:30    Past Surgical History:  Procedure Laterality Date  . [place]  . INTRAUTERINE DEVICE (IUD) INSERTION  08/19/2015   Mirena    Current Outpatient Medications  Medication Sig Dispense Refill  . albuterol (PROAIR HFA) 108 (90 Base) MCG/ACT inhaler Inhale 2 puffs into the lungs every 4 (four) hours as needed for wheezing or shortness of breath. 1 Inhaler 3  . budesonide-formoterol (SYMBICORT) 160-4.5 MCG/ACT inhaler Inhale 2 puffs into the lungs 2 (two) times daily. Reported on 05/21/2015 1 Inhaler 11  . Cholecalciferol (VITAMIN D-3) 1000 units CAPS Take 1 capsule by mouth daily.    .  CRESTOR 5 MG tablet Take 5 mg by mouth every evening.  12  . cyclobenzaprine (FLEXERIL) 10 MG tablet TAKE ONE TABLET (10 mg total) BY MOUTH 3 TIMES DAILY AS NEEDED for muscle spasms.    Marland Kitchen dexlansoprazole (DEXILANT) 60 MG capsule Take one capsule every morning 30 capsule 3  . EPINEPHrine (EPIPEN 2-PAK) 0.3 mg/0.3 mL IJ SOAJ injection Inject 0.3 mLs (0.3 mg total) into the muscle once. 2 Device 2  . estradiol (ESTRACE) 1 MG tablet Take 1 tablet (1 mg total) by mouth daily. 90 tablet 0  . fluvoxaMINE (LUVOX) 100 MG tablet Take 100 mg by mouth at bedtime. Taking 150mg  daily    . levonorgestrel (MIRENA) 20 MCG/24HR IUD 1 each by Intrauterine route once. Place 12/08/2010.    . traZODone (DESYREL) 50 MG tablet Take 50 mg by mouth at bedtime. Patient takes 16.5. mg po q hs.    . Ubiquinol 100 MG CAPS Take by mouth.     No current facility-administered medications for this visit.     Family History  Problem Relation Age of Onset  . Breast cancer Mother 33  . Depression Mother   . Anxiety disorder Mother   . Osteoporosis Mother   .  Thyroid disease Mother        on synthroid  . Depression Sister   . Anxiety disorder Sister   . Stroke Father        died 2012/03/10  . Dementia Father   . Anxiety disorder Brother   . Depression Brother   . Dystonia Brother        cervical    Review of Systems  All other systems reviewed and are negative.   Exam:   BP 102/62 (BP Location: Right Arm, Patient Position: Sitting, Cuff Size: Normal)   Pulse 76   Resp 16   Ht 5' 4.75" (1.645 m)   Wt 129 lb (58.5 kg)   BMI 21.63 kg/m   Weight change:  -23#   Height: 5' 4.75" (164.5 cm)  Ht Readings from Last 3 Encounters:  04/12/18 5' 4.75" (1.645 m)  02/18/17 5' 4.75" (1.645 m)  02/05/17 5' 4.75" (1.645 m)    General appearance: alert, cooperative and appears stated age Head: Normocephalic, without obvious abnormality, atraumatic Neck: no adenopathy, supple, symmetrical, trachea midline and thyroid  normal to inspection and palpation Lungs: clear to auscultation bilaterally Breasts: normal appearance, no masses or tenderness Heart: regular rate and rhythm Abdomen: soft, non-tender; bowel sounds normal; no masses,  no organomegaly Extremities: extremities normal, atraumatic, no cyanosis or edema Skin: Skin color, texture, turgor normal. No rashes or lesions Lymph nodes: Cervical, supraclavicular, and axillary nodes normal. No abnormal inguinal nodes palpated Neurologic: Grossly normal   Pelvic: External genitalia:  no lesions              Urethra:  normal appearing urethra with no masses, tenderness or lesions              Bartholins and Skenes: normal                 Vagina: normal appearing vagina with normal color and discharge, no lesions              Cervix: no lesions , IUD string noted              Pap taken: No. Bimanual Exam:  Uterus:  normal size, contour, position, consistency, mobility, non-tender              Adnexa: normal adnexa and no mass, fullness, tenderness               Rectovaginal: Confirms               Anus:  normal sphincter tone, no lesions  Chaperone was present for exam.  A:  Well Woman with normal exam PMP on estradiol with Mirena IUD place 2017 Family hx of breast cancer in mother age 72 H/O purposeful weight loss Asthma  P:   Mammogram guidelines reviewed.  Doing 3D yearly MMGs pap smear with neg HR HPV 12/18.  Not indicated today. RF for estradiol 1mg  daily.  #90/4RF Has started the Shingrix vaccination.  Will plan to get the Pneumovax this year as well. return annually or prn

## 2018-04-14 ENCOUNTER — Encounter: Payer: Self-pay | Admitting: Gastroenterology

## 2018-04-14 ENCOUNTER — Other Ambulatory Visit: Payer: Self-pay

## 2018-04-14 ENCOUNTER — Ambulatory Visit (AMBULATORY_SURGERY_CENTER): Payer: Self-pay | Admitting: *Deleted

## 2018-04-14 VITALS — Ht 65.75 in | Wt 131.0 lb

## 2018-04-14 DIAGNOSIS — Z86010 Personal history of colonic polyps: Secondary | ICD-10-CM

## 2018-04-14 MED ORDER — SOD PICOSULFATE-MAG OX-CIT ACD 10-3.5-12 MG-GM -GM/160ML PO SOLN: 1.0000 | ORAL | 0 refills | Status: DC

## 2018-04-14 NOTE — Progress Notes (Signed)
No egg or soy allergy known to patient  No issues with past sedation with any surgeries  or procedures, no intubation problems  No diet pills per patient No home 02 use per patient  No blood thinners per patient  Pt denies issues with constipation  No A fib or A flutter  EMMI video sent to pt's e mail - declined  Clenpiq coupon on line

## 2018-04-28 ENCOUNTER — Encounter: Payer: 59 | Admitting: Gastroenterology

## 2018-08-02 ENCOUNTER — Encounter: Payer: Self-pay | Admitting: Obstetrics & Gynecology

## 2018-08-04 ENCOUNTER — Encounter: Payer: Self-pay | Admitting: Obstetrics & Gynecology

## 2018-08-11 ENCOUNTER — Encounter: Payer: 59 | Admitting: Gastroenterology

## 2018-08-24 ENCOUNTER — Telehealth: Payer: Self-pay | Admitting: Gastroenterology

## 2018-08-24 NOTE — Telephone Encounter (Signed)

## 2018-08-25 ENCOUNTER — Encounter: Payer: Self-pay | Admitting: Gastroenterology

## 2018-08-25 ENCOUNTER — Other Ambulatory Visit: Payer: Self-pay

## 2018-08-25 ENCOUNTER — Ambulatory Visit (AMBULATORY_SURGERY_CENTER): Payer: 59 | Admitting: Gastroenterology

## 2018-08-25 VITALS — BP 106/58 | HR 67 | Temp 98.4°F | Resp 12 | Ht 65.75 in | Wt 131.0 lb

## 2018-08-25 DIAGNOSIS — D122 Benign neoplasm of ascending colon: Secondary | ICD-10-CM

## 2018-08-25 DIAGNOSIS — D124 Benign neoplasm of descending colon: Secondary | ICD-10-CM | POA: Diagnosis not present

## 2018-08-25 DIAGNOSIS — Z860100 Personal history of colon polyps, unspecified: Secondary | ICD-10-CM

## 2018-08-25 DIAGNOSIS — D120 Benign neoplasm of cecum: Secondary | ICD-10-CM

## 2018-08-25 MED ORDER — SODIUM CHLORIDE 0.9 % IV SOLN: 500.0000 mL | Freq: Once | INTRAVENOUS | Status: DC

## 2018-08-25 NOTE — Patient Instructions (Signed)
Impression/Recommendations:  Diverticulosis  Handout given to patient. Hemorrhoid handout given to patient. High fiber diet handout given to patient.  Continue present medications. Await pathology results.  Repeat colonoscopy for surveillance based on pathology results.  If any problems with hemorrhoids, use Preparation H twice daily after bowel movement for 7-10 days.  Return to GI office as needed.  YOU HAD AN ENDOSCOPIC PROCEDURE TODAY AT [facility]:   Refer to the procedure report that was given to you for any specific questions about what was found during the examination.  If the procedure report does not answer your questions, please call your gastroenterologist to clarify.  If you requested that your care partner not be given the details of your procedure findings, then the procedure report has been included in a sealed envelope for you to review at your convenience later.  YOU SHOULD EXPECT: Some feelings of bloating in the abdomen. Passage of more gas than usual.  Walking can help get rid of the air that was put into your GI tract during the procedure and reduce the bloating. If you had a lower endoscopy (such as a colonoscopy or flexible sigmoidoscopy) you may notice spotting of blood in your stool or on the toilet paper. If you underwent a bowel prep for your procedure, you may not have a normal bowel movement for a few days.  Please Note:  You might notice some irritation and congestion in your nose or some drainage.  This is from the oxygen used during your procedure.  There is no need for concern and it should clear up in a day or so.  SYMPTOMS TO REPORT IMMEDIATELY:   Following lower endoscopy (colonoscopy or flexible sigmoidoscopy):  Excessive amounts of blood in the stool  Significant tenderness or worsening of abdominal pains  Swelling of the abdomen that is new, acute  Fever of 100F or higher  For urgent or emergent issues, a gastroenterologist can  be reached at any hour by calling [phone].   DIET:  We do recommend a small meal at first, but then you may proceed to your regular diet.  Drink plenty of fluids but you should avoid alcoholic beverages for 24 hours.  ACTIVITY:  You should plan to take it easy for the rest of today and you should NOT DRIVE or use heavy machinery until tomorrow (because of the sedation medicines used during the test).    FOLLOW UP: Our staff will call the number listed on your records 48-72 hours following your procedure to check on you and address any questions or concerns that you may have regarding the information given to you following your procedure. If we do not reach you, we will leave a message.  We will attempt to reach you two times.  During this call, we will ask if you have developed any symptoms of COVID 19. If you develop any symptoms (ie: fever, flu-like symptoms, shortness of breath, cough etc.) before then, please call [phone].  If you test positive for Covid 19 in the 2 weeks post procedure, please call and report this information to [place].    If any biopsies were taken you will be contacted by phone or by letter within the next 1-3 weeks.  Please call us at [phone] if you have not heard about the biopsies in 3 weeks.    SIGNATURES/CONFIDENTIALITY: You and/or your care partner have signed paperwork which will be entered into your electronic medical record.  These signatures attest to the  fact that that the information above on your After Visit Summary has been reviewed and is understood.  Full responsibility of the confidentiality of this discharge information lies with you and/or your care-partner.

## 2018-08-25 NOTE — Op Note (Signed)
Allison Soto Patient Name: Allison Soto Procedure Date: 08/25/2018 2:11 PM MRN: [id number] Endoscopist: Jackquline Denmark , MD Age: 58 Referring MD:  Date of Birth: 26-Nov-1960 Gender: Female Account #: [id number] Procedure:                Colonoscopy Indications:              Screening for colorectal malignant neoplasm, High                            risk colon cancer surveillance: Personal history of                            colonic polyps Medicines:                Monitored Anesthesia Care Procedure:                Pre-Anesthesia Assessment:                           - Prior to the procedure, a History and Physical                            was performed, and patient medications and                            allergies were reviewed. The patient's tolerance of                            previous anesthesia was also reviewed. The risks                            and benefits of the procedure and the sedation                            options and risks were discussed with the patient.                            All questions were answered, and informed consent                            was obtained. Prior Anticoagulants: The patient has                            taken no previous anticoagulant or antiplatelet                            agents. ASA Grade Assessment: II - A patient with                            mild systemic disease. After reviewing the risks                            and benefits, the patient was deemed in  satisfactory condition to undergo the procedure.                           After obtaining informed consent, the colonoscope                            was passed under direct vision. Throughout the                            procedure, the patient's blood pressure, pulse, and                            oxygen saturations were monitored continuously. The                            Colonoscope was introduced through the  anus and                            advanced to the the cecum, identified by                            appendiceal orifice and ileocecal valve. The                            colonoscopy was performed without difficulty. The                            patient tolerated the procedure well. The quality                            of the bowel preparation was good. The ileocecal                            valve, appendiceal orifice, and rectum were                            photographed. Scope In: 2:18:39 PM Scope Out: 2:36:10 PM Scope Withdrawal Time: 0 hours 12 minutes 51 seconds  Total Procedure Duration: 0 hours 17 minutes 31 seconds  Findings:                 Two sessile polyps were found in the ascending                            colon and cecum. The polyps were 6 mm in size.                            These polyps were removed with a cold snare.                            Resection and retrieval were complete.                           A 6 mm polyp was found in the mid descending colon.  The polyp was sessile. The polyp was removed with a                            cold snare. Resection and retrieval were complete.                           Multiple medium-mouthed diverticula were found in                            the sigmoid colon.                           Non-bleeding internal hemorrhoids were found during                            retroflexion. The hemorrhoids were moderate.                           The exam was otherwise without abnormality on                            direct and retroflexion views. Complications:            No immediate complications. Estimated Blood Loss:     Estimated blood loss: none. Impression:               -Colonic polyps s/p polypectomy.                           -Moderate sigmoid diverticulosis.                           -Non-bleeding internal hemorrhoids.                           -Otherwise normal  colonoscopy. Recommendation:           - Patient has a contact number available for                            emergencies. The signs and symptoms of potential                            delayed complications were discussed with the                            patient. Return to normal activities tomorrow.                            Written discharge instructions were provided to the                            patient.                           - High fiber diet.                           -  Continue present medications.                           - Await pathology results.                           - Repeat colonoscopy for surveillance based on                            pathology results.                           - Brochures regarding diverticulosis.                           - If she has any problems with hemorrhoids, use                            Preparation H 1 twice daily after the bowel                            movement for 7 to 10 days and let us know.                           - Return to GI office PRN. Jackquline Denmark, MD 08/25/2018 2:42:43 PM This report has been signed electronically.

## 2018-08-25 NOTE — Progress Notes (Signed)
To PACU, VSS. Report to [email or web address] 

## 2018-08-25 NOTE — Progress Notes (Signed)
Called to room to assist during endoscopic procedure.  Patient ID and intended procedure confirmed with present staff. Received instructions for my participation in the procedure from the performing physician.  

## 2018-08-29 ENCOUNTER — Telehealth: Payer: Self-pay

## 2018-08-29 NOTE — Telephone Encounter (Signed)
  Follow up Call-  Call back number 08/25/2018  Post procedure Call Back phone  # [phone]  Permission to leave phone message Yes  Some recent data might be hidden     Patient questions:  Do you have a fever, pain , or abdominal swelling? No. Pain Score  0 *  Have you tolerated food without any problems? Yes.    Have you been able to return to your normal activities? Yes.    Do you have any questions about your discharge instructions: Diet   No. Medications  No. Follow up visit  No.  Do you have questions or concerns about your Care? No.  Actions: * If pain score is 4 or above: No action needed, pain <4.    1. Have you developed a fever since your procedure? no  2.   Have you had an respiratory symptoms (SOB or cough) since your procedure? no  3.   Have you tested positive for COVID 19 since your procedure no  4.   Have you had any family members/close contacts diagnosed with the COVID 19 since your procedure?  no   If yes to any of these questions please route to Joylene John, RN and Alphonsa Gin, Therapist, sports.

## 2018-08-30 ENCOUNTER — Encounter: Payer: Self-pay | Admitting: Gastroenterology

## 2019-06-25 ENCOUNTER — Other Ambulatory Visit: Payer: Self-pay | Admitting: Obstetrics & Gynecology

## 2019-06-26 NOTE — Telephone Encounter (Signed)
Medication refill request: Estradiol  Last AEX:  04-12-2018 SM  Next AEX: 09-15-19 Last MMG (if hormonal medication request): 08-04-2018 density C/BIRADS 2 benign  Refill authorized: Today, please advise.   Medication pended for #90, 0RF. Please refill if appropriate.

## 2019-08-31 NOTE — Progress Notes (Signed)
59 y.o. G16P2002 Married White or Caucasian female here for annual exam.  Received letter from [place] about increased breast cancer with her family hx.  Tyrer Cusick model was done with pt today.  19% lifetime risk of breast cancer noted today.     Denies vaginal bleeding.    Retired this past year.  Was so glad to be able to do this.  PCP:  Dr. Jeryl Columbia at [facility].  Has appt in October and will have blood work done then.  No LMP recorded. (Menstrual status: IUD).          Sexually active: Yes.    The current method of family planning is IUD.   Mirena IUD placed 08-19-15 Exercising: Yes.    walking 3 miles daily Smoker:  no  Health Maintenance: Pap:  02-05-17 neg HPV HR neg History of abnormal Pap:  no MMG: 08-03-2018 bilateral, 08-04-2018 rt breast category c density bi rads 2:neg,  08-08-2019 neg per patient Colonoscopy: 08-25-2018 BMD:   2013 TDaP:  2014 Pneumonia vaccine(s):  Had done.  Will have prevnar done in October. Shingrix:   2020 Hep C testing: neg 2017 Screening Labs: in October with PCP   reports that she has never smoked. She has never used smokeless tobacco. She reports current alcohol use. She reports that she does not use drugs.  Past Medical History:  Diagnosis Date  . Allergy   . Anemia   . Anxiety   . Arthritis   . Asthma   . Depression   . GERD (gastroesophageal reflux disease)   . Gestational diabetes    prediabetes- lost weight- none now- A1C 5.5  . Hyperlipidemia   . Pneumonia 1987  . Reflux   . Single cyst of left breast 02/01/12   Diagnostic mammogram and ultrasound - cyst at 2:30    Past Surgical History:  Procedure Laterality Date  . [place]  . COLONOSCOPY    . INTRAUTERINE DEVICE (IUD) INSERTION  08/19/2015   Mirena  . POLYPECTOMY    . UPPER GASTROINTESTINAL ENDOSCOPY    . WISDOM TOOTH EXTRACTION      Current Outpatient Medications  Medication Sig Dispense Refill  . albuterol (PROAIR HFA) 108 (90 Base) MCG/ACT  inhaler Inhale 2 puffs into the lungs every 4 (four) hours as needed for wheezing or shortness of breath. 1 Inhaler 3  . budesonide-formoterol (SYMBICORT) 160-4.5 MCG/ACT inhaler Inhale 2 puffs into the lungs 2 (two) times daily. Reported on 05/21/2015 1 Inhaler 11  . Cholecalciferol (VITAMIN D-3) 1000 units CAPS Take 1 capsule by mouth daily.    . Coenzyme Q10 (CO Q 10 PO) Take by mouth.    . CRESTOR 5 MG tablet Take 5 mg by mouth every evening.  12  . dexlansoprazole (DEXILANT) 60 MG capsule Take one capsule every morning 30 capsule 3  . estradiol (ESTRACE) 1 MG tablet TAKE 1 TABLET DAILY 90 tablet 1  . fluvoxaMINE (LUVOX) 100 MG tablet Take 100 mg by mouth at bedtime. Taking 150mg  daily    . levonorgestrel (MIRENA) 20 MCG/24HR IUD 1 each by Intrauterine route once. Place 12/08/2010.    . traZODone (DESYREL) 50 MG tablet Take 50 mg by mouth at bedtime.     Marland Kitchen EPINEPHrine (EPIPEN 2-PAK) 0.3 mg/0.3 mL IJ SOAJ injection Inject 0.3 mLs (0.3 mg total) into the muscle once. (Patient not taking: Reported on 08/25/2018) 2 Device 2   Current Facility-Administered Medications  Medication Dose Route Frequency Provider Last Rate  Last Admin  . 0.9 %  sodium chloride infusion  500 mL Intravenous Once Jackquline Denmark, MD        Family History  Problem Relation Age of Onset  . Breast cancer Mother 15  . Depression Mother   . Anxiety disorder Mother   . Osteoporosis Mother   . Thyroid disease Mother        on synthroid  . Depression Sister   . Anxiety disorder Sister   . Stroke Father        died February 24, 2012  . Dementia Father   . Anxiety disorder Brother   . Depression Brother   . Dystonia Brother        cervical  . Colon cancer Neg Hx   . Colon polyps Neg Hx   . Esophageal cancer Neg Hx   . Rectal cancer Neg Hx   . Stomach cancer Neg Hx     Review of Systems  Constitutional: Negative.   HENT: Negative.   Eyes: Negative.   Respiratory: Negative.   Cardiovascular: Negative.    Gastrointestinal: Negative.   Endocrine: Negative.   Genitourinary: Negative.   Musculoskeletal: Negative.   Skin: Negative.   Allergic/Immunologic: Negative.   Neurological: Negative.   Hematological: Negative.   Psychiatric/Behavioral: Negative.     Exam:   BP (!) 110/64   Pulse 68   Resp 16   Ht 5' 4.5" (1.638 m)   Wt 142 lb (64.4 kg)   BMI 24.00 kg/m   Height: 5' 4.5" (163.8 cm)  General appearance: alert, cooperative and appears stated age Head: Normocephalic, without obvious abnormality, atraumatic Neck: no adenopathy, supple, symmetrical, trachea midline and thyroid normal to inspection and palpation Lungs: clear to auscultation bilaterally Breasts: normal appearance, no masses or tenderness Heart: regular rate and rhythm Abdomen: soft, non-tender; bowel sounds normal; no masses,  no organomegaly Extremities: extremities normal, atraumatic, no cyanosis or edema Skin: Skin color, texture, turgor normal. No rashes or lesions Lymph nodes: Cervical, supraclavicular, and axillary nodes normal. No abnormal inguinal nodes palpated Neurologic: Grossly normal   Pelvic: External genitalia:  no lesions              Urethra:  normal appearing urethra with no masses, tenderness or lesions              Bartholins and Skenes: normal                 Vagina: normal appearing vagina with normal color and discharge, no lesions              Cervix: no lesions, IUD string noted              Pap taken: Yes.   Bimanual Exam:  Uterus:  normal size, contour, position, consistency, mobility, non-tender              Adnexa: normal adnexa and no mass, fullness, tenderness               Rectovaginal: Confirms               Anus:  normal sphincter tone, no lesions  Chaperone, Royal Hawthorn, CMA, was present for exam.  A:  Well Woman with normal exam PMp, on estradiol with MIrena IUD placed 2017 (new 6 year indication discussed) Family hx of breast cancer in her mother age 71 19% lifetime  risk of breast cancer  Asthma  P:   Mammogram wast just done in June Limited breast MRI will be  scheduled for pt pap smear with neg RH HPV 2018.  Not indicated today. BMD will be done next year Having second pneumonia in October Lab work is scheduled in [place] RF for estradiol 1.0mg  daily.  #90/4RF Plan IUD removal for 2023 according to newest indications for Mirena Return annually or prn

## 2019-09-04 ENCOUNTER — Encounter: Payer: Self-pay | Admitting: Obstetrics & Gynecology

## 2019-09-04 ENCOUNTER — Other Ambulatory Visit (HOSPITAL_COMMUNITY)
Admission: RE | Admit: 2019-09-04 | Discharge: 2019-09-04 | Disposition: A | Discharge status: Home / Self Care | Payer: No Typology Code available for payment source | Source: Ambulatory Visit | Attending: Obstetrics & Gynecology | Admitting: Obstetrics & Gynecology

## 2019-09-04 ENCOUNTER — Ambulatory Visit (INDEPENDENT_AMBULATORY_CARE_PROVIDER_SITE_OTHER): Payer: No Typology Code available for payment source | Admitting: Obstetrics & Gynecology

## 2019-09-04 ENCOUNTER — Other Ambulatory Visit: Payer: Self-pay

## 2019-09-04 VITALS — BP 110/64 | HR 68 | Resp 16 | Ht 64.5 in | Wt 142.0 lb

## 2019-09-04 DIAGNOSIS — Z124 Encounter for screening for malignant neoplasm of cervix: Secondary | ICD-10-CM | POA: Insufficient documentation

## 2019-09-04 DIAGNOSIS — Z01419 Encounter for gynecological examination (general) (routine) without abnormal findings: Secondary | ICD-10-CM | POA: Diagnosis not present

## 2019-09-04 MED ORDER — ESTRADIOL 1 MG PO TABS: 1.0000 mg | ORAL_TABLET | Freq: Every day | ORAL | 4 refills | Status: DC

## 2019-09-05 ENCOUNTER — Telehealth: Payer: Self-pay | Admitting: *Deleted

## 2019-09-05 DIAGNOSIS — Z9189 Other specified personal risk factors, not elsewhere classified: Secondary | ICD-10-CM

## 2019-09-05 LAB — CYTOLOGY - PAP
Adequacy: ABSENT
Comment: NEGATIVE
Diagnosis: NEGATIVE
High risk HPV: NEGATIVE

## 2019-09-05 NOTE — Telephone Encounter (Signed)
Order placed for abbreviated breast MRI at [facility].  Lifetime risk 19%  Call to patient, advised as seen above. Advised patient [facility] will contact her directly to schedule. Patient is aware IMG will not require precert. Patient is aware to call if any additional questions or concerns.   Routing to provider for final review. Patient is agreeable to disposition. Will close encounter.   Cc: Hayley Carder

## 2019-09-05 NOTE — Telephone Encounter (Signed)
-----   Message from Megan Salon, MD sent at 09/04/2019 12:04 PM EDT ----- Regarding: limited breast MRI Sharee Pimple, Pt has 19% lifetime risk for breast cancer and wants to have a limited breast MRI.  Can you call breast center to get this scheduled?  Thanks.  Vinnie Level

## 2019-09-11 ENCOUNTER — Encounter: Payer: Self-pay | Admitting: Obstetrics & Gynecology

## 2019-09-15 ENCOUNTER — Ambulatory Visit: Payer: 59 | Admitting: Obstetrics & Gynecology

## 2019-09-26 ENCOUNTER — Encounter: Payer: Self-pay | Admitting: Sports Medicine

## 2019-09-26 ENCOUNTER — Other Ambulatory Visit: Payer: Self-pay

## 2019-09-26 ENCOUNTER — Other Ambulatory Visit: Payer: Self-pay | Admitting: Sports Medicine

## 2019-09-26 ENCOUNTER — Ambulatory Visit (INDEPENDENT_AMBULATORY_CARE_PROVIDER_SITE_OTHER): Payer: No Typology Code available for payment source

## 2019-09-26 ENCOUNTER — Ambulatory Visit (INDEPENDENT_AMBULATORY_CARE_PROVIDER_SITE_OTHER): Payer: No Typology Code available for payment source | Admitting: Sports Medicine

## 2019-09-26 DIAGNOSIS — M79671 Pain in right foot: Secondary | ICD-10-CM | POA: Diagnosis not present

## 2019-09-26 DIAGNOSIS — M79672 Pain in left foot: Secondary | ICD-10-CM | POA: Diagnosis not present

## 2019-09-26 DIAGNOSIS — M21619 Bunion of unspecified foot: Secondary | ICD-10-CM

## 2019-09-26 DIAGNOSIS — M21611 Bunion of right foot: Secondary | ICD-10-CM

## 2019-09-26 DIAGNOSIS — M2040 Other hammer toe(s) (acquired), unspecified foot: Secondary | ICD-10-CM

## 2019-09-26 DIAGNOSIS — M21612 Bunion of left foot: Secondary | ICD-10-CM | POA: Diagnosis not present

## 2019-09-26 NOTE — Progress Notes (Signed)
Subjective: Allison Soto is a 59 y.o. female patient who presents to office for evaluation of Left 2nd toe joint curling and ocassional L>R bunion pain. Patient complains of minimal pain off and on, patient now has difficulty fitting shoes comfortably and walks 3 miles a day. Ranks pain 1-3/10.  Patient has not tried any treatment. Patient denies any other pedal complaints.   Review of Systems  All other systems reviewed and are negative.    Patient Active Problem List   Diagnosis Date Noted  . Body aches 05/22/2015  . Fibroids, intramural 02/06/2015  . IUD (intrauterine device) in place 02/06/2015  . Other malaise and fatigue 09/27/2013  . Elevated glucose 09/27/2013    Current Outpatient Medications on File Prior to Visit  Medication Sig Dispense Refill  . albuterol (PROAIR HFA) 108 (90 Base) MCG/ACT inhaler Inhale 2 puffs into the lungs every 4 (four) hours as needed for wheezing or shortness of breath. 1 Inhaler 3  . budesonide-formoterol (SYMBICORT) 160-4.5 MCG/ACT inhaler Inhale 2 puffs into the lungs 2 (two) times daily. Reported on 05/21/2015 1 Inhaler 11  . Cholecalciferol (VITAMIN D-3) 1000 units CAPS Take 1 capsule by mouth daily.    . Coenzyme Q10 (CO Q 10 PO) Take by mouth.    . CRESTOR 5 MG tablet Take 5 mg by mouth every evening.  12  . dexlansoprazole (DEXILANT) 60 MG capsule Take one capsule every morning 30 capsule 3  . EPINEPHrine (EPIPEN 2-PAK) 0.3 mg/0.3 mL IJ SOAJ injection Inject 0.3 mLs (0.3 mg total) into the muscle once. (Patient not taking: Reported on 08/25/2018) 2 Device 2  . estradiol (ESTRACE) 1 MG tablet Take 1 tablet (1 mg total) by mouth daily. 90 tablet 4  . fluvoxaMINE (LUVOX) 100 MG tablet Take 100 mg by mouth at bedtime. Taking 150mg  daily    . levonorgestrel (MIRENA) 20 MCG/24HR IUD 1 each by Intrauterine route once. Place 12/08/2010.    . traZODone (DESYREL) 50 MG tablet Take 50 mg by mouth at bedtime.      Current Facility-Administered  Medications on File Prior to Visit  Medication Dose Route Frequency Provider Last Rate Last Admin  . 0.9 %  sodium chloride infusion  500 mL Intravenous Once Jackquline Denmark, MD        Allergies  Allergen Reactions  . Shellfish Allergy Anaphylaxis    Throat closes.  . Penicillins Other (See Comments)    Mouth sores.    Objective:  General: Alert and oriented x3 in no acute distress  Dermatology: No open lesions bilateral lower extremities, no webspace macerations, no ecchymosis bilateral, all nails x 10 are well manicured. Dry skin 1st toes bilateral.  Vascular: Dorsalis Pedis and Posterior Tibial pedal pulses 2/4, Capillary Fill Time 3 seconds, (+) pedal hair growth bilateral, no edema bilateral lower extremities, Temperature gradient within normal limits.  Neurology: Johney Maine sensation intact via light touch bilateral.   Musculoskeletal: Mild tenderness with palpation left 2nd toe and minimal pain at bunion deformity L>R, no limitation or crepitus with range of motion, deformity reducible, tracking not trackbound, there is no 1st ray hypermobility noted bilateral. Midtarsal, Subtalar joint, and ankle joint range of motion is within normal limits. 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    Xrays  Right/Left Foot  Impression: Intermetatarsal angle above normal limits supportive of bunion with hammertoe/long 2nd toe.        Assessment and Plan: Problem List Items Addressed This Visit    None    Visit Diagnoses    Bunion    -  Primary   Hammer toe, unspecified laterality       Foot pain, bilateral           -Complete examination performed -Xrays reviewed -Discussed treatment options; discussed long toe/hammertoe and HAV deformity;conservative and   Surgical management; risks, benefits, alternatives discussed. All patient's questions answered. -Rx bunion shields bilateral -Recommend continue with good supportive shoes   -Gave sample of foot miracle cream for dry skin bilateral 1st toes -Patient to return to office if no better after 2 months  or sooner if condition worsens.  Landis Martins, DPM

## 2019-09-26 NOTE — Patient Instructions (Signed)
Bunion  A bunion is a bump on the base of the big toe that forms when the bones of the big toe joint move out of position. Bunions may be small at first, but they often get larger over time. They can make walking painful. What are the causes? A bunion may be caused by:  Wearing narrow or pointed shoes that force the big toe to press against the other toes.  Abnormal foot development that causes the foot to roll inward (pronate).  Changes in the foot that are caused by certain diseases, such as rheumatoid arthritis or polio.  A foot injury. What increases the risk? The following factors may make you more likely to develop this condition:  Wearing shoes that squeeze the toes together.  Having certain diseases, such as: ? Rheumatoid arthritis. ? Polio. ? Cerebral palsy.  Having family members who have bunions.  Being born with a foot deformity, such as flat feet or low arches.  Doing activities that put a lot of pressure on the feet, such as ballet dancing. What are the signs or symptoms? The main symptom of a bunion is a noticeable bump on the big toe. Other symptoms may include:  Pain.  Swelling around the big toe.  Redness and inflammation.  Thick or hardened skin on the big toe or between the toes.  Stiffness or loss of motion in the big toe.  Trouble with walking. How is this diagnosed? A bunion may be diagnosed based on your symptoms, medical history, and activities. You may have tests, such as:  X-rays. These allow your health care provider to check the position of the bones in your foot and look for damage to your joint. They also help your health care provider determine the severity of your bunion and the best way to treat it.  Joint aspiration. In this test, a sample of fluid is removed from the toe joint. This test may be done if you are in a lot of pain. It helps rule out diseases that cause painful swelling of the joints, such as arthritis. How is this  treated? Treatment depends on the severity of your symptoms. The goal of treatment is to relieve symptoms and prevent the bunion from getting worse. Your health care provider may recommend:  Wearing shoes that have a wide toe box.  Using bunion pads to cushion the affected area.  Taping your toes together to keep them in a normal position.  Placing a device inside your shoe (orthotics) to help reduce pressure on your toe joint.  Taking medicine to ease pain, inflammation, and swelling.  Applying heat or ice to the affected area.  Doing stretching exercises.  Surgery to remove scar tissue and move the toes back into their normal position. This treatment is rare. Follow these instructions at home: Managing pain, stiffness, and swelling   If directed, put ice on the painful area: ? Put ice in a plastic bag. ? Place a towel between your skin and the bag. ? Leave the ice on for 20 minutes, 2-3 times a day. Activity   If directed, apply heat to the affected area before you exercise. Use the heat source that your health care provider recommends, such as a moist heat pack or a heating pad. ? Place a towel between your skin and the heat source. ? Leave the heat on for 20-30 minutes. ? Remove the heat if your skin turns bright red. This is especially important if you are unable to feel pain,   heat, or cold. You may have a greater risk of getting burned.  Do exercises as told by your health care provider. General instructions  Support your toe joint with proper footwear, shoe padding, or taping as told by your health care provider.  Take over-the-counter and prescription medicines only as told by your health care provider.  Keep all follow-up visits as told by your health care provider. This is important. Contact a health care provider if your symptoms:  Get worse.  Do not improve in 2 weeks. Get help right away if you have:  Severe pain and trouble with walking. Summary  A  bunion is a bump on the base of the big toe that forms when the bones of the big toe joint move out of position.  Bunions can make walking painful.  Treatment depends on the severity of your symptoms.  Support your toe joint with proper footwear, shoe padding, or taping as told by your health care provider. This information is not intended to replace advice given to you by your health care provider. Make sure you discuss any questions you have with your health care provider. Document Revised: 08/02/2017 Document Reviewed: 06/08/2017 Elsevier Patient Education  2020 [employer]  

## 2019-10-04 ENCOUNTER — Telehealth: Payer: Self-pay | Admitting: *Deleted

## 2019-10-04 ENCOUNTER — Ambulatory Visit
Admission: RE | Admit: 2019-10-04 | Discharge: 2019-10-04 | Disposition: A | Discharge status: Home / Self Care | Payer: No Typology Code available for payment source | Source: Ambulatory Visit | Attending: Obstetrics & Gynecology | Admitting: Obstetrics & Gynecology

## 2019-10-04 ENCOUNTER — Other Ambulatory Visit: Payer: Self-pay

## 2019-10-04 DIAGNOSIS — N6320 Unspecified lump in the left breast, unspecified quadrant: Secondary | ICD-10-CM

## 2019-10-04 DIAGNOSIS — Z9189 Other specified personal risk factors, not elsewhere classified: Secondary | ICD-10-CM

## 2019-10-04 IMAGING — MR MR BREAST WO/W CM  BILAT
4 of 5 series · 29 of 48 positions shown · IV contrast (6ml gadavist)
Comparison: Previous mammograms [facility], the most recent
dated [DATE].

CLINICAL DATA: Family history of breast cancer with her mother
diagnosed at age 75 and 1st cousin diagnosed at age 50. Calculated
lifetime risk of developing breast cancer of 22.9%.

LABS:  None obtained on site today.
EXAM:
BILATERAL BREAST MRI WITH AND WITHOUT CONTRAST
TECHNIQUE: Multiplanar, multisequence MR images of both breasts were obtained
prior to and following the intravenous administration of 6 ml of
Gadavist

[Series 2: t2_tirm_tra ipat (a-p) · axial · 3.0mm · 0.70mm/px · z∈[-105,+66]mm · 5 of 58 slices shown]
[im 1/58]
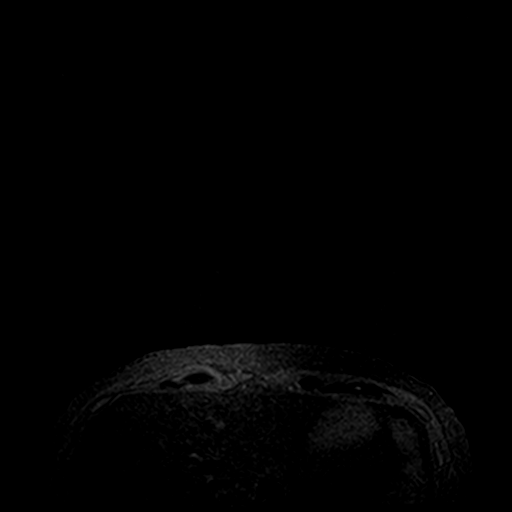
[im 15/58]
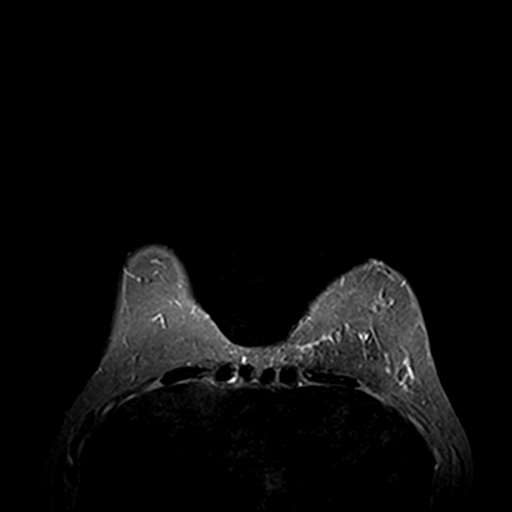
[im 29/58]
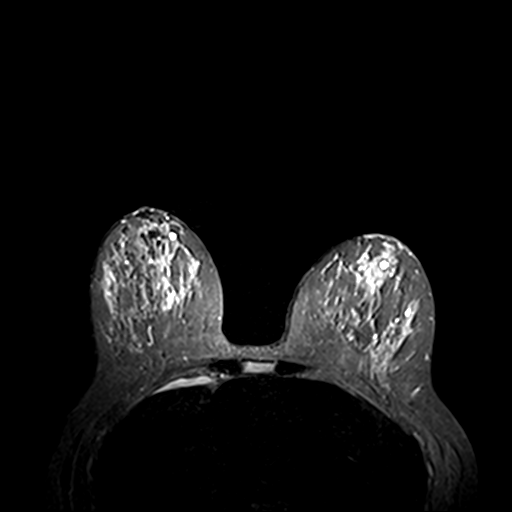
[im 43/58]
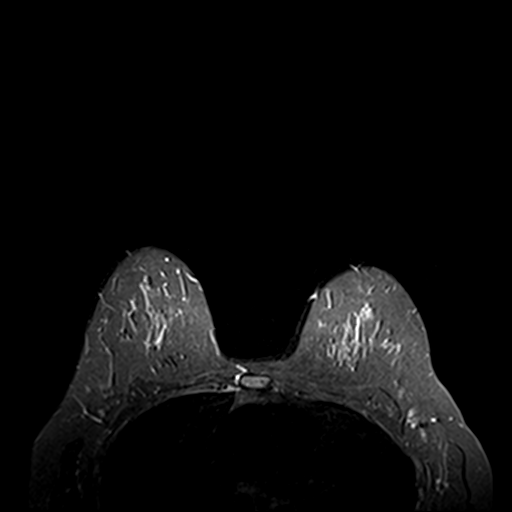
[im 58/58]
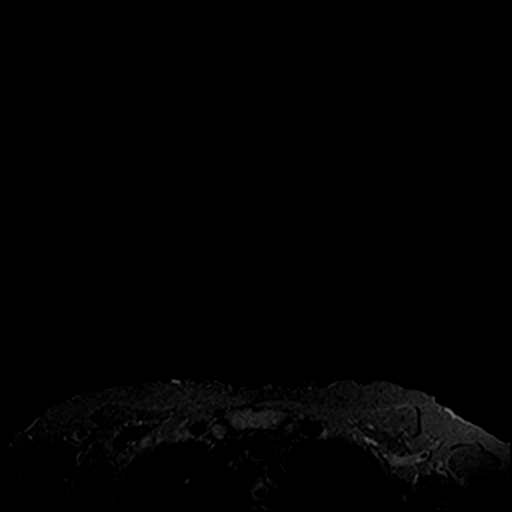

[Series 3: fl3d pre-cm · axial · non-contrast · 1.2mm · 0.94mm/px · z∈[-105,+67]mm · 8 of 144 slices shown]
[im 1/144]
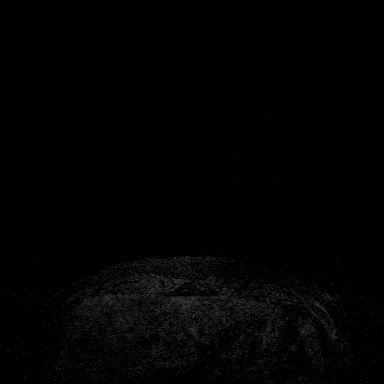
[im 23/144]
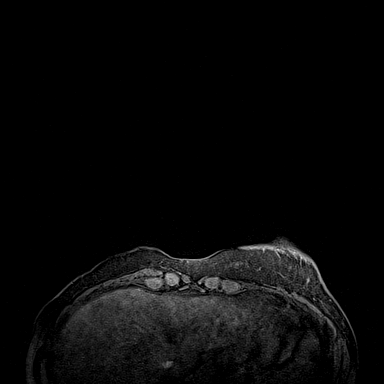
[im 45/144]
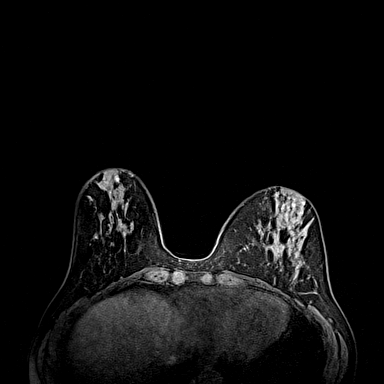
[im 67/144]
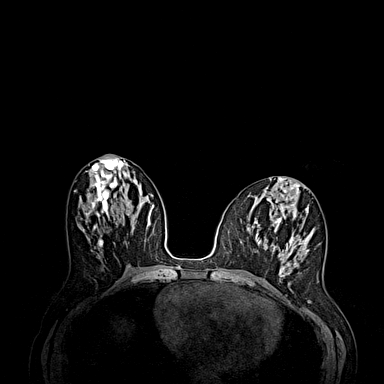
[im 78/144]
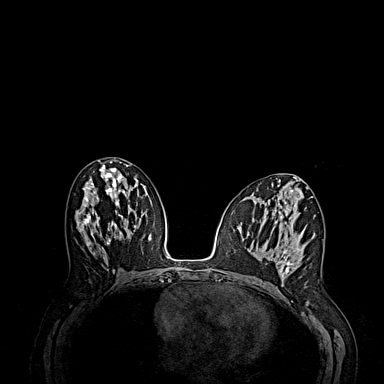
[im 100/144]
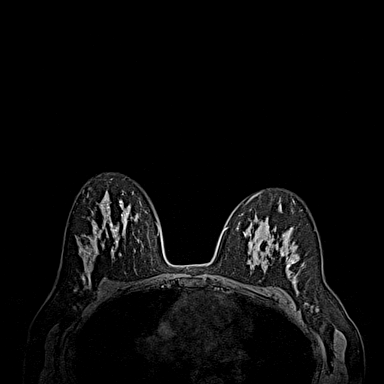
[im 122/144]
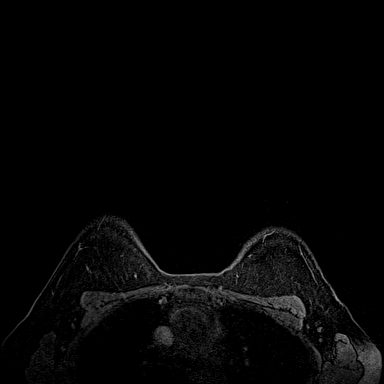
[im 144/144]
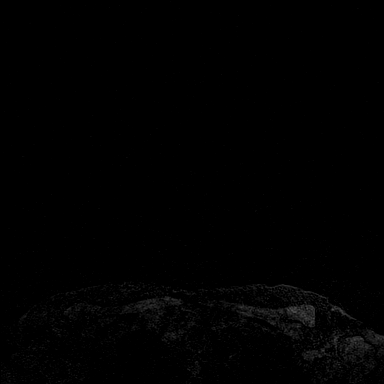

[Series 4: fl3d post immediate · axial · 1.2mm · 0.94mm/px · z∈[-105,+67]mm · 8 of 144 slices shown (1 of 2)]
[im 1/144]
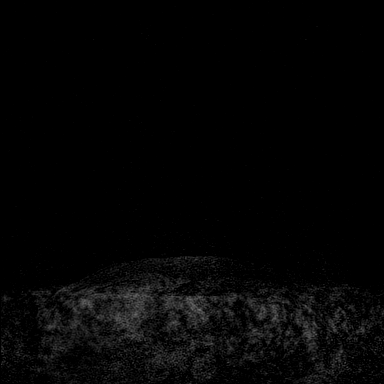
[im 23/144]
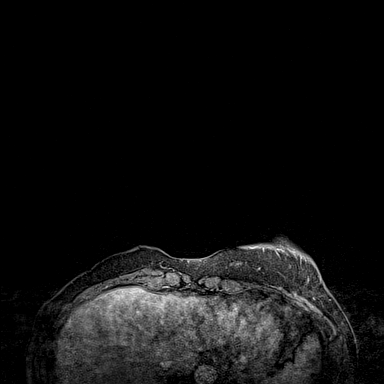
[im 45/144]
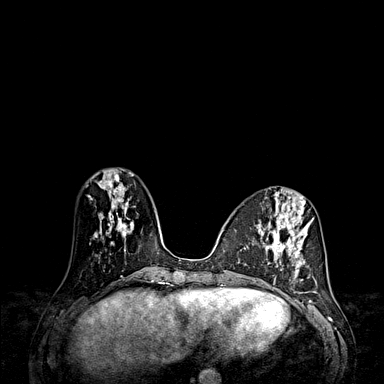
[im 67/144]
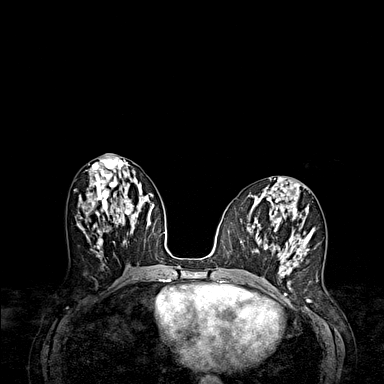
[im 78/144]
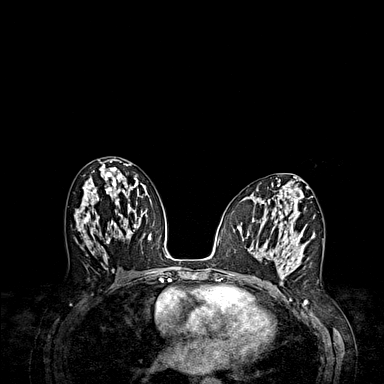
[im 100/144]
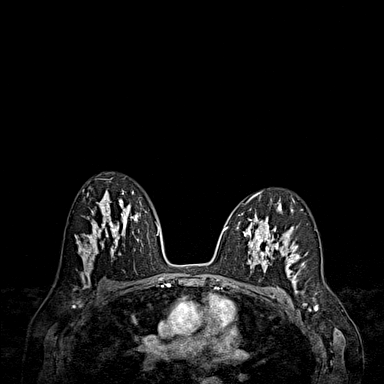
[im 122/144]
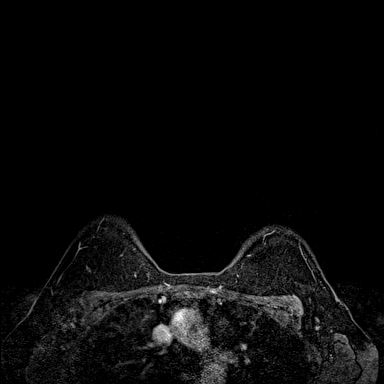
[im 144/144]
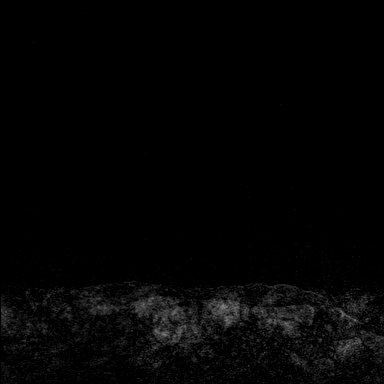

[Series 5: fl3d post immediate · axial · 1.2mm · 0.94mm/px · z∈[-105,+67]mm · 8 of 144 slices shown (2 of 2)]
[im 1/144]
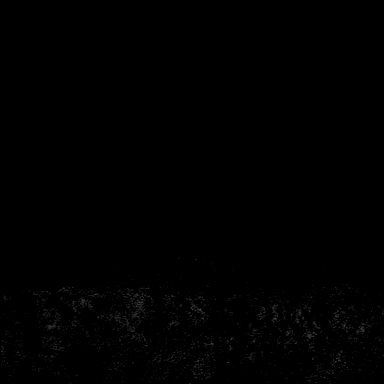
[im 23/144]
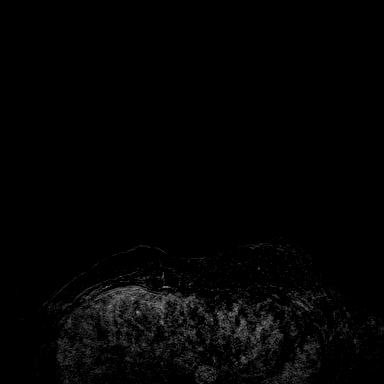
[im 45/144]
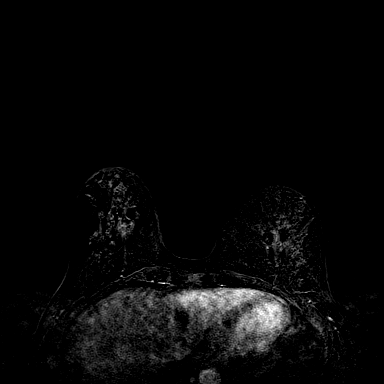
[im 67/144]
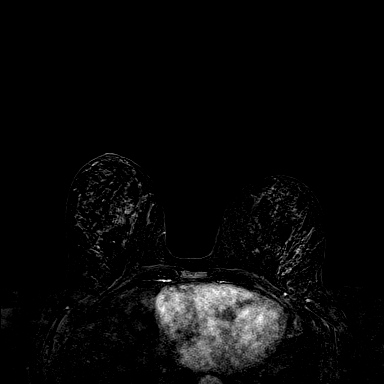
[im 78/144]
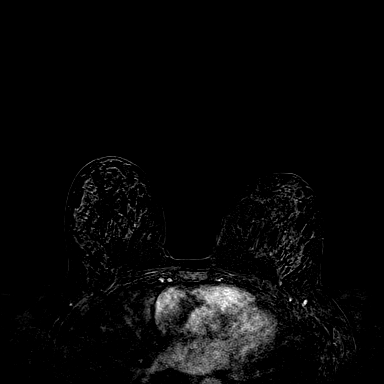
[im 100/144]
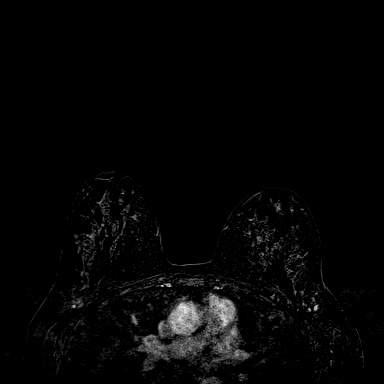
[im 122/144]
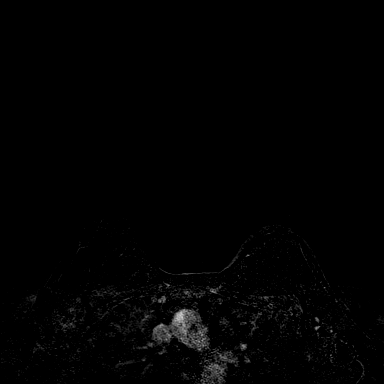
[im 144/144]
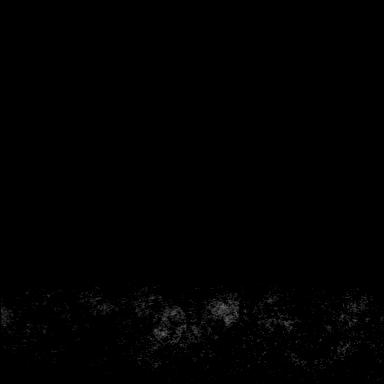

[29 of 48 positions shown; findings below may reference images not displayed]

Three-dimensional MR images were rendered by post-processing of the
original MR data on an independent workstation. The
three-dimensional MR images were interpreted, and findings are
reported in the following complete MRI report for this study. Three
dimensional images were evaluated at the independent interpreting
workstation using the DynaCAD thin client.
FINDINGS: Breast composition: c. Heterogeneous fibroglandular tissue.

Background parenchymal enhancement: Moderate to marked

Right breast: Multiple small cysts. No mass or enhancement
suspicious for malignancy.

Left breast: Multiple small cysts. There is also a 7 x 5 mm oval,
circumscribed, homogeneously enhancing mass in the upper inner
quadrant of the breast in the middle 3rd on image number 48 series
5. This is mildly lobulated and is in approximately the 11 o'clock
position. No mammographic correlate.

Lymph nodes: No abnormal appearing lymph nodes.

Ancillary findings:  None.
IMPRESSION: 1. 7 x 5 mm indeterminate oval enhancing mass in approximately the
11 o'clock position of the left breast.
2. Multiple small bilateral breast cysts.

RECOMMENDATION:
Focused ultrasound of the upper inner quadrant of the left breast to
determine if the 7 x 5 mm mass can be visualized with ultrasound for
biopsy purposes. If this cannot be visualized with ultrasound, MR
guided core needle biopsy this mass would be recommended.

BI-RADS CATEGORY  4: Suspicious.

## 2019-10-04 MED ORDER — GADOBUTROL 1 MMOL/ML IV SOLN
6.0000 mL | Freq: Once | INTRAVENOUS | Status: AC | PRN
  Administered 2019-10-04: 6 mL via INTRAVENOUS

## 2019-10-04 NOTE — Telephone Encounter (Signed)
Spoke with patient. Advised as seen below. Patient agreeable to proceed with left breast US, patient request to schedule at [facility]. Advised patient orders will placed and [facility] will contact her directly to schedule.   Patient states if she will need a MRI guided biopsy, she will need anxiety medication. She will contact the office once [place] completed, can then review RX with Dr. Sabra Heck if needed. Patient is aware she will need a driver day of biopsy. Patient is aware proceeding with left breast US at this time. Questions answered. Advised [facility] will contact her directly to schedule. Patient agreeable.   Order placed at [facility] for left breast US.   Patient returned call. Patient request to proceed with IMG at [facility] instead of [facility].  Left breast US order cancelled at [facility]. Advised patient I will contact [facility] to schedule and return call with appt details once scheduled. Patient agreeable.   Placed in [place] hold.    Call placed to [facility], spoke with patient care coordinator, Janett Billow. Patient scheduled for left breast US on 10/10/19 at 8:30am. Will fax order. [facility] has access to MRI report.   Call returned to patient, left detailed message, ok per dpr. Advised of appt as seen above. Return call to office if any additional questions.   Routing to Dr. Sabra Heck for final review.

## 2019-10-04 NOTE — Telephone Encounter (Addendum)
Incoming call from [place], Therapist, sports at [facility].  Patient was seen today at [facility] for abbreviated breast MRI.  Patient has not been notified of results.  Needs f/u left breast US at [facility] or [facility], patient preference.   IMPRESSION: 1. 7 x 5 mm indeterminate oval enhancing mass in approximately the 11 o'clock position of the left breast. 2. Multiple small bilateral breast cysts.  RECOMMENDATION: Focused ultrasound of the upper inner quadrant of the left breast to determine if the 7 x 5 mm mass can be visualized with ultrasound for biopsy purposes. If this cannot be visualized with ultrasound, MR guided core needle biopsy this mass would be recommended.  BI-RADS CATEGORY  4: Suspicious.

## 2019-10-04 NOTE — Telephone Encounter (Signed)
Left message to call Broedy Osbourne, RN at [facility] [phone].   

## 2019-10-04 NOTE — Telephone Encounter (Signed)
Patient returned call

## 2019-10-10 ENCOUNTER — Other Ambulatory Visit: Payer: Self-pay | Admitting: Radiology

## 2019-10-10 DIAGNOSIS — R9389 Abnormal findings on diagnostic imaging of other specified body structures: Secondary | ICD-10-CM

## 2019-10-17 ENCOUNTER — Telehealth: Payer: Self-pay

## 2019-10-17 ENCOUNTER — Encounter: Payer: Self-pay | Admitting: Obstetrics & Gynecology

## 2019-10-17 NOTE — Telephone Encounter (Signed)
Pt sent following mychart message:   [place], [facility] Dr.Miller,  I am going to have an MRI guided biopsy on Sept 13. I found that I became extremely anxious and claustrophobic during the screening MRI that I had a few weeks ago. Is it possible for you to write a prescription for something to calm my nerves for the biopsy but more for the MRI part.  If you will I use [facility] for my drugstore.  Thanks  [employer]

## 2019-10-17 NOTE — Telephone Encounter (Signed)
Routing to Dr Sabra Heck for review and medication refill, please advise.

## 2019-10-18 NOTE — Telephone Encounter (Signed)
Spoke with pt. Pt advised Dr Sabra Heck not in office today. Pt states would like to have something to make her less anxious for her upcoming breast MRI and biopsy. Pt states had something 15 years ago with brain MRI due to hearing loss, but unsure what medication. Pt states feels claustrophobic.  Advised will ask Dr Sabra Heck first thing in morning on 10/19/19 and return call with recommendations. Pt agreeable.   Pharmacy at [facility] confirmed.   Routing to Dr Sabra Heck,  Please advise.

## 2019-10-18 NOTE — Telephone Encounter (Signed)
Pt sent following Mychart message:   Julieanne Manson to Megan Salon, MD     3:15 PM I was following up from yesterday's message. I am currently out of town and my husband will be leaving tomorrow to join me. If you were able to write the prescription he would need to pick it up tomorrow. We won't return to town til Sunday and my biopsy is At 7:10 am on Monday. Thanks [employer]

## 2019-10-19 ENCOUNTER — Other Ambulatory Visit: Payer: Self-pay | Admitting: Obstetrics & Gynecology

## 2019-10-19 MED ORDER — LORAZEPAM 1 MG PO TABS: ORAL_TABLET | ORAL | 0 refills | Status: DC

## 2019-10-19 NOTE — Telephone Encounter (Signed)
Spoke with pt. Pt given update about new Rx per Dr Sabra Heck. Pt agreeable and verbalized understanding of when to take before MRI. Pt thankful for call and Rx.  Encounter closed.

## 2019-10-19 NOTE — Telephone Encounter (Signed)
Ok to use ativan 1mg .  Take 1 tab 1 hour before.  #2/0RF.

## 2019-10-23 ENCOUNTER — Inpatient Hospital Stay: Admission: RE | Admit: 2019-10-23 | Payer: No Typology Code available for payment source | Source: Ambulatory Visit

## 2019-10-25 ENCOUNTER — Other Ambulatory Visit: Payer: Self-pay

## 2019-10-25 ENCOUNTER — Ambulatory Visit
Admission: RE | Admit: 2019-10-25 | Discharge: 2019-10-25 | Disposition: A | Discharge status: Home / Self Care | Payer: No Typology Code available for payment source | Source: Ambulatory Visit | Attending: Radiology | Admitting: Radiology

## 2019-10-25 ENCOUNTER — Other Ambulatory Visit: Payer: Self-pay | Admitting: Body Imaging

## 2019-10-25 DIAGNOSIS — R9389 Abnormal findings on diagnostic imaging of other specified body structures: Secondary | ICD-10-CM

## 2019-10-25 IMAGING — MG MM BREAST LOCALIZATION CLIP
4 series · 4 of 12 positions shown · non-contrast
Comparison: Previous exam(s).

CLINICAL DATA: Post biopsy mammogram of the left breast for clip
placement.

EXAM:
DIAGNOSTIC LEFT MAMMOGRAM POST MRI BIOPSY

[L ML synth-2D]
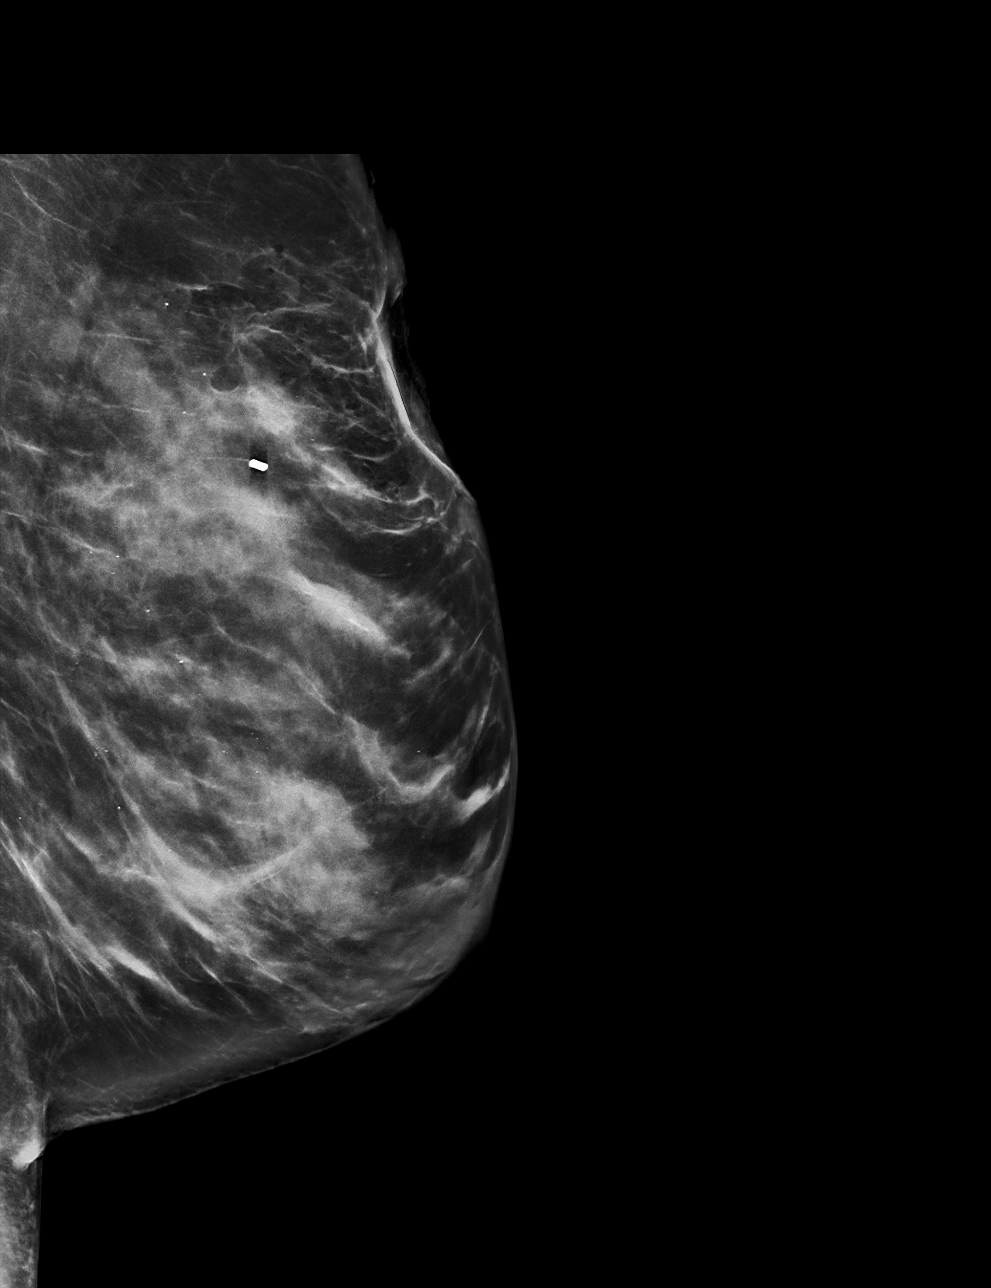

[L CC synth-2D]
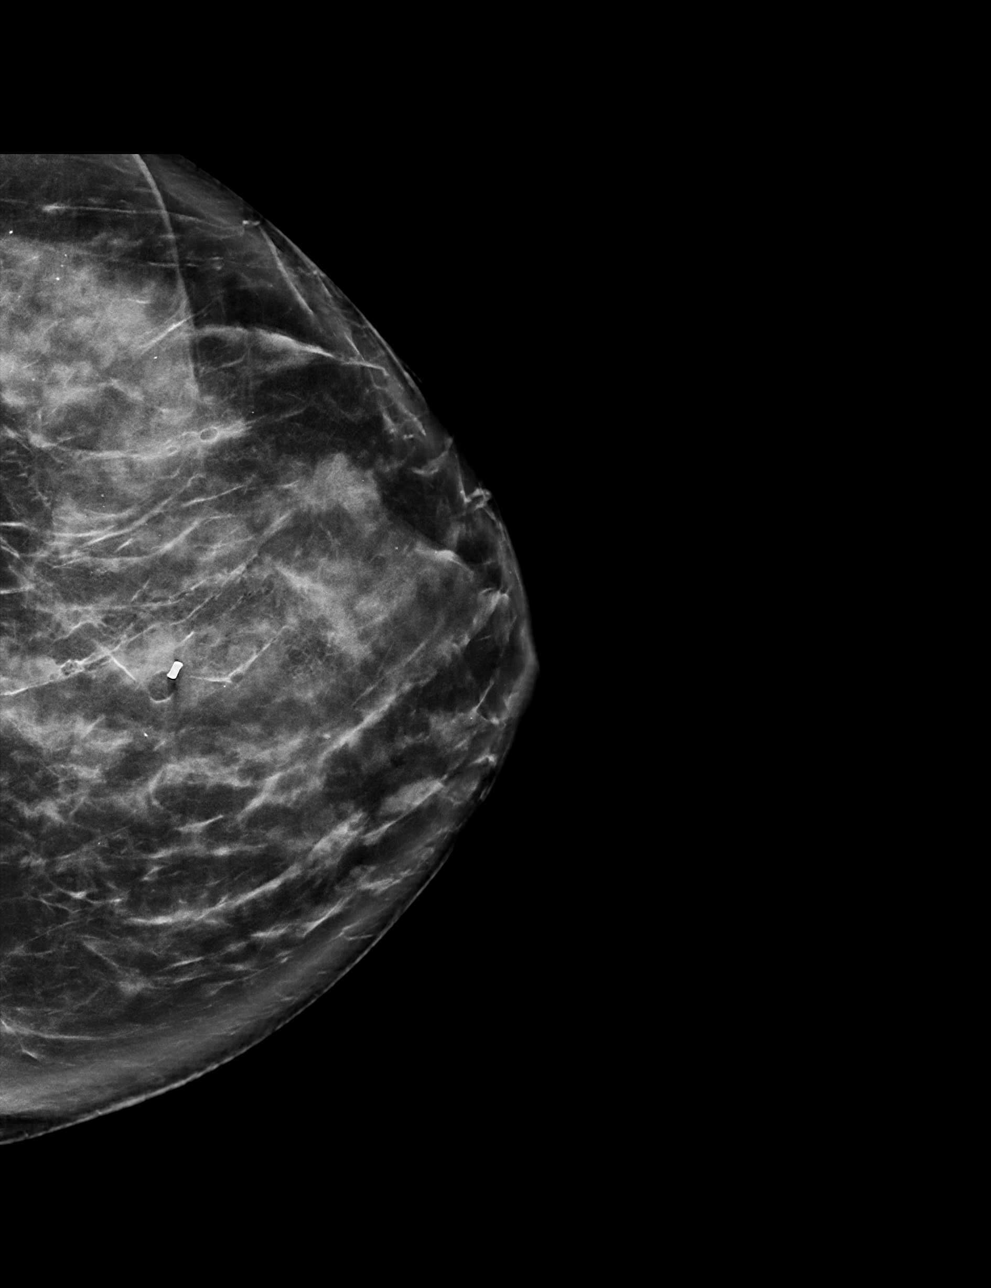

[L ML tomo · tomo slice 41/82.0]
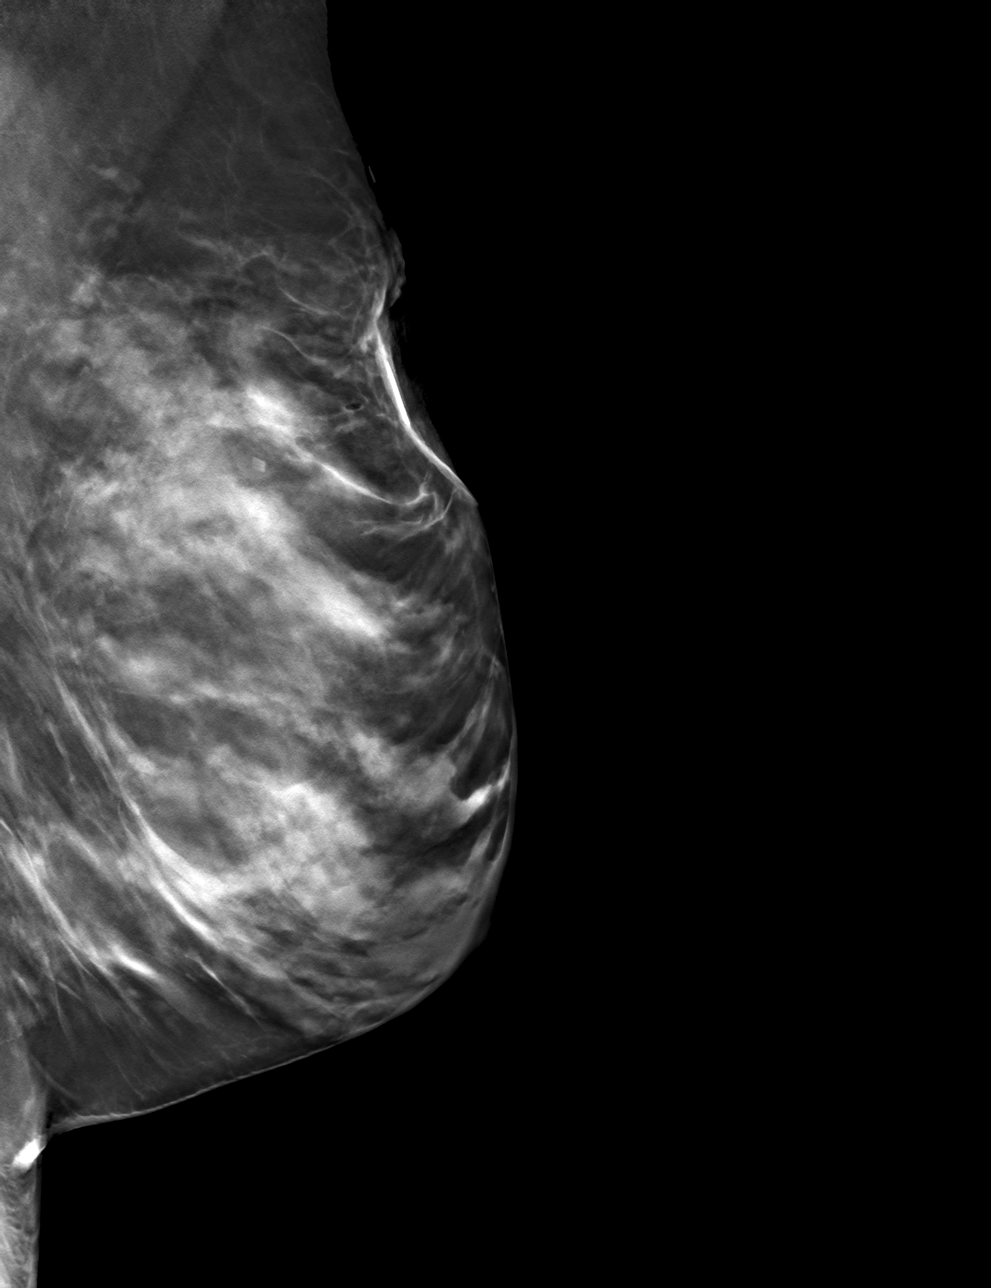

[L CC tomo · tomo slice 41/80.0]
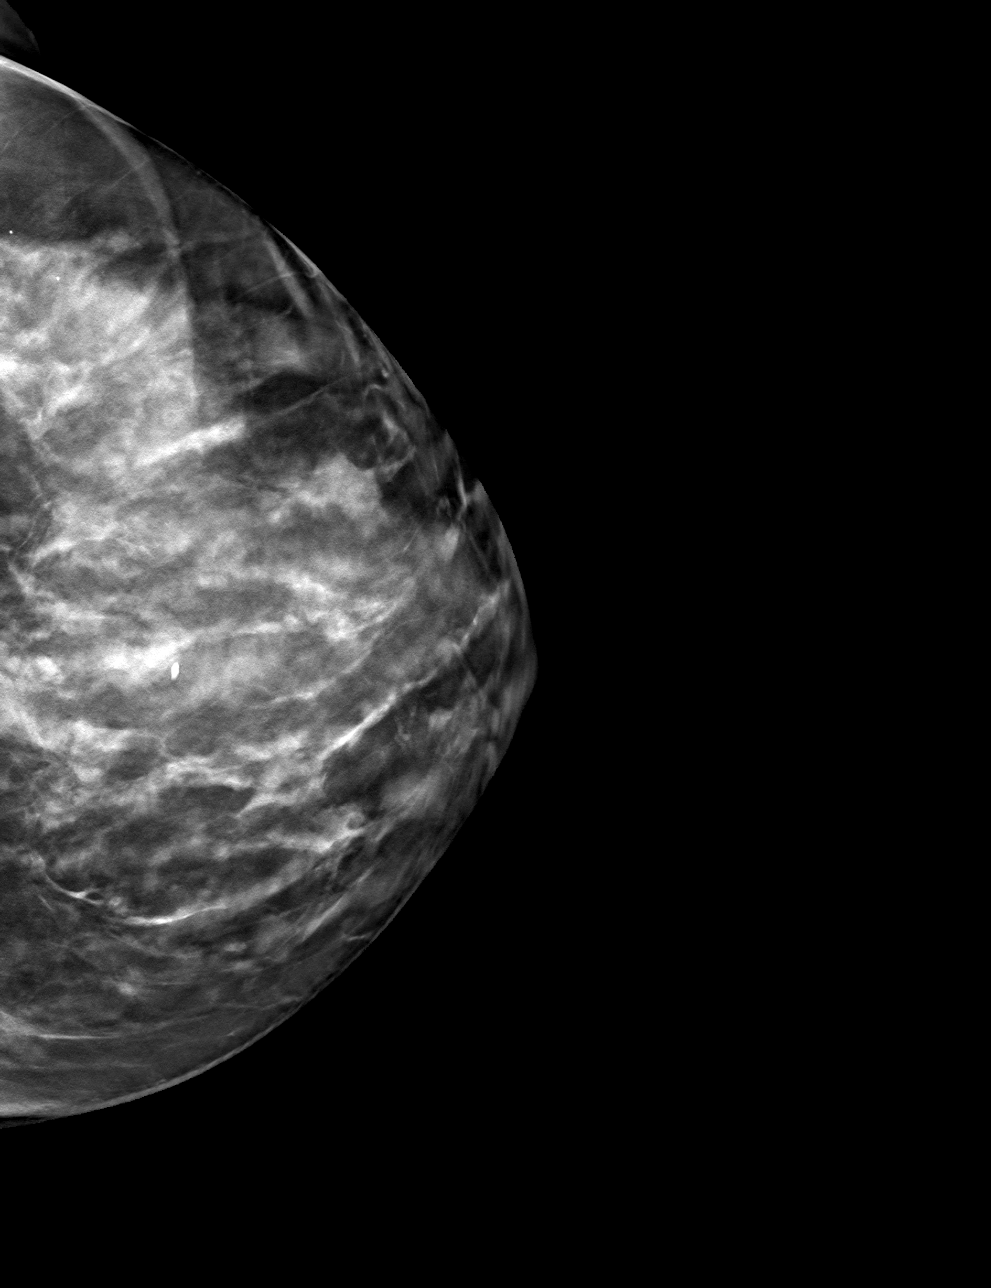

[4 of 12 positions shown; findings below may reference images not displayed]

FINDINGS: Mammographic images were obtained following MRI guided biopsy of a
mass in the upper inner left breast. The biopsy marking clip is in
expected position at the site of biopsy. Only one of the two clips
placed at the same site in the upper inner left breast is seen
within the breast. The other was likely removed while obtaining
additional samples.
IMPRESSION: Appropriate positioning of the cylinder shaped biopsy marking clip
at the site of biopsy in the upper inner left breast.

Final Assessment: Post Procedure Mammograms for Marker Placement

## 2019-10-25 IMAGING — MR MR BREAST BX W LOC DEV 1ST LESION IMAGE BX SPEC MR GUIDE*L*
7 of 10 series · 32 of 48 positions shown · IV contrast (6ml Gadavist)
Comparison: Previous exams.
COMPARISON: Previous exams.

Addendum:
CLINICAL DATA: 67-year-old female presenting for MRI guided biopsy
of a left breast mass.

EXAM:
MRI GUIDED CORE NEEDLE BIOPSY OF THE LEFT BREAST
TECHNIQUE: Multiplanar, multisequence MR imaging of the left breast was
performed both before and after administration of intravenous
contrast.
CONTRAST:  6mL GADAVIST GADOBUTROL 1 MMOL/ML IV SOLN

[Series 2: fiducial unilateral · sagittal · 2.0mm · 1.33mm/px · 3 of 52 slices shown]
[im 1/52]
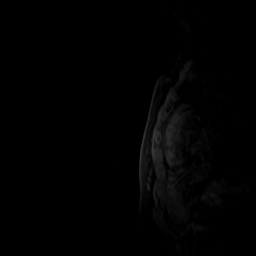
[im 26/52]
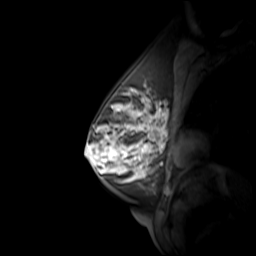
[im 52/52]
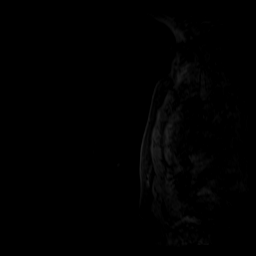

[Series 3: dynamic pre · axial · non-contrast · 1.3mm · 0.73mm/px · z∈[-117,+110]mm · 5 of 176 slices shown]
[im 1/176]
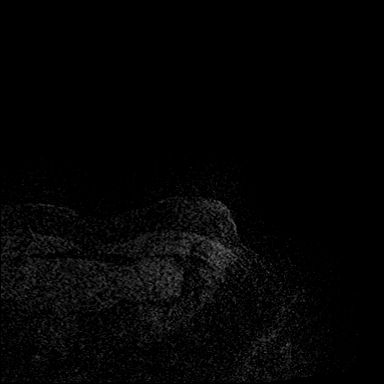
[im 44/176]
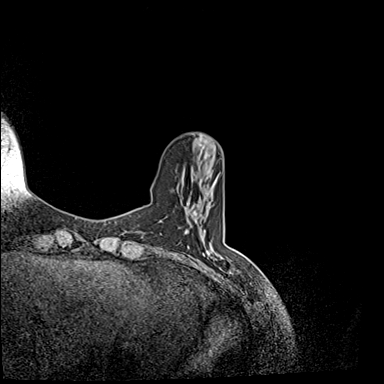
[im 88/176]
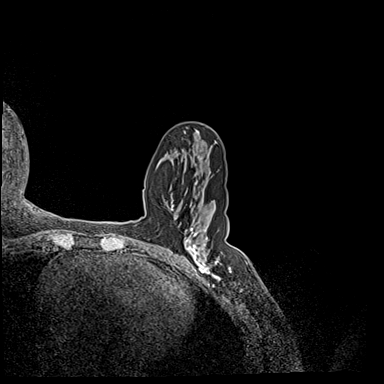
[im 132/176]
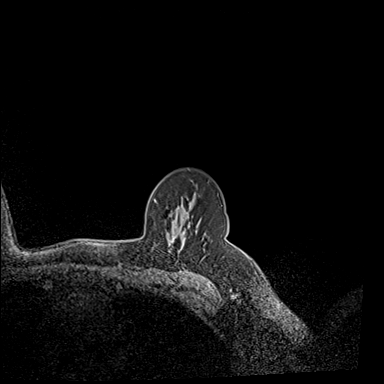
[im 176/176]
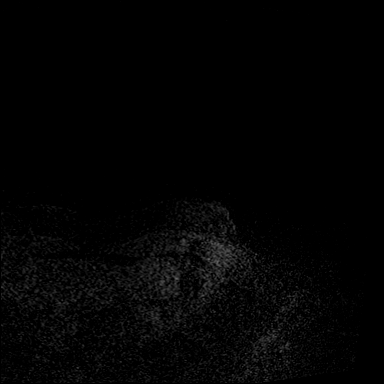

[Series 4: dynamic post 20 · axial · 1.3mm · 0.73mm/px · z∈[-117,+110]mm · 5 of 176 slices shown (1 of 2)]
[im 1/176]
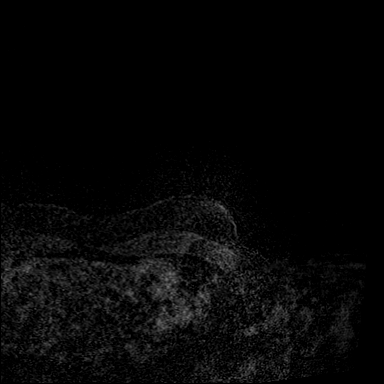
[im 44/176]
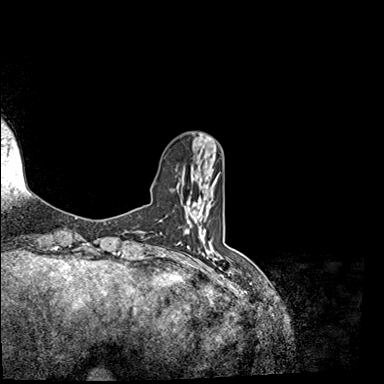
[im 88/176]
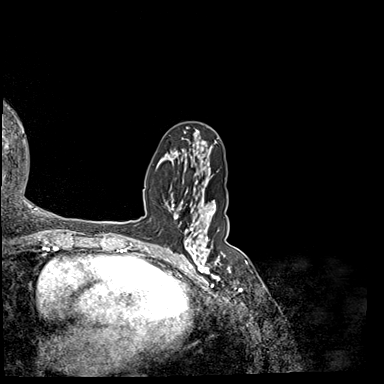
[im 132/176]
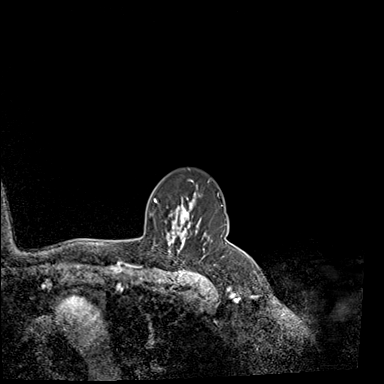
[im 176/176]
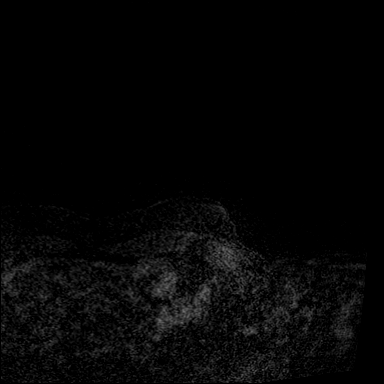

[Series 5: dynamic post 20 · axial · 1.3mm · 0.73mm/px · z∈[-117,+110]mm · 5 of 176 slices shown (2 of 2)]
[im 1/176]
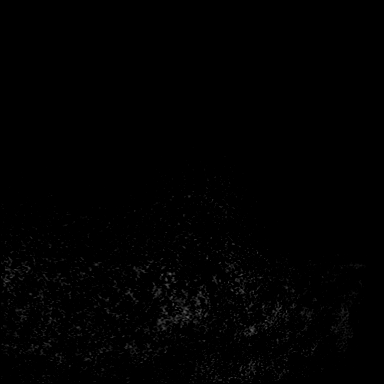
[im 44/176]
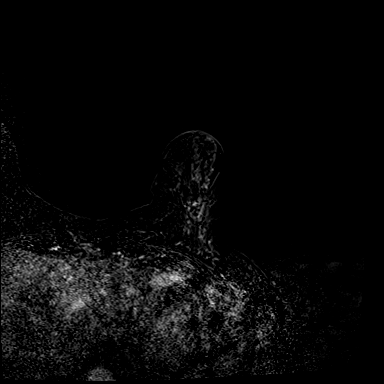
[im 88/176]
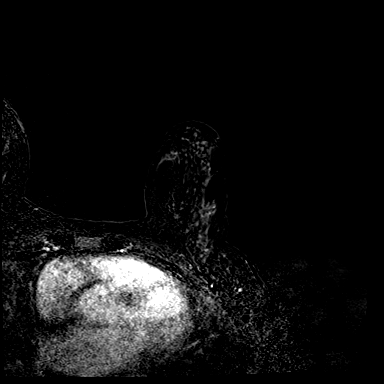
[im 132/176]
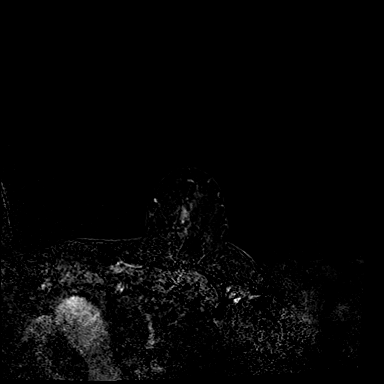
[im 176/176]
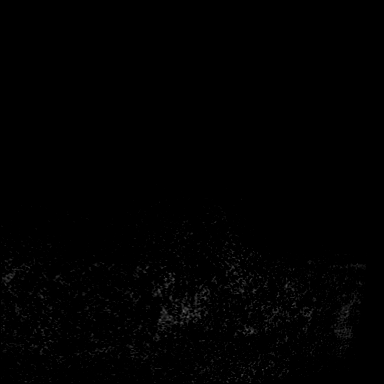

[Series 6: dynamic post 3 · axial · 1.3mm · 0.73mm/px · z∈[-117,+110]mm · 5 of 176 slices shown (1 of 2)]
[im 1/176]
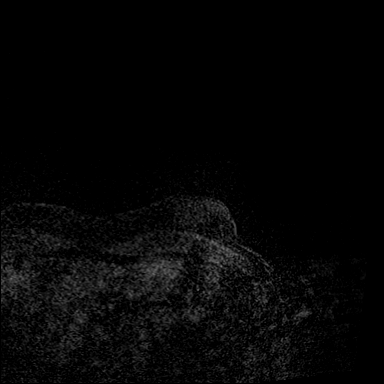
[im 44/176]
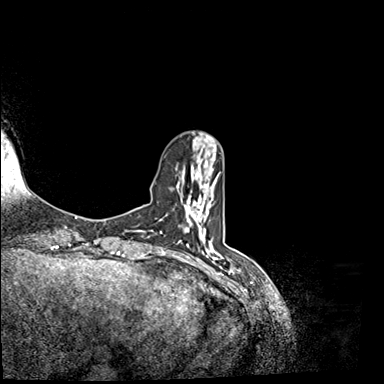
[im 88/176]
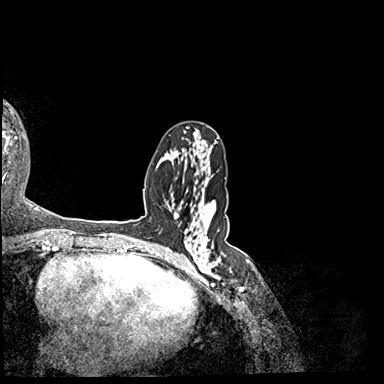
[im 132/176]
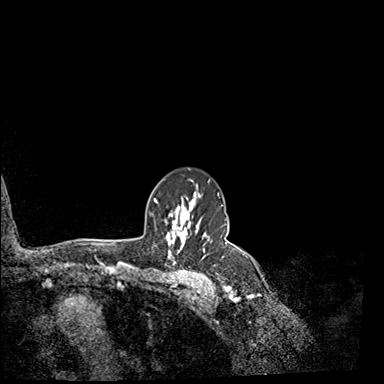
[im 176/176]
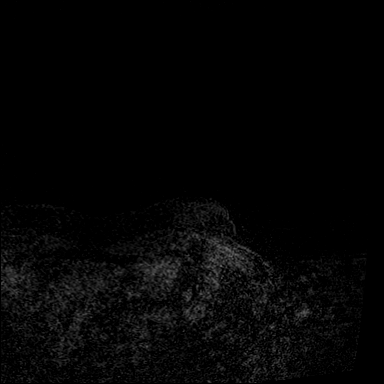

[Series 7: dynamic post 3 · axial · 1.3mm · 0.73mm/px · z∈[-117,+110]mm · 5 of 176 slices shown (2 of 2)]
[im 1/176]
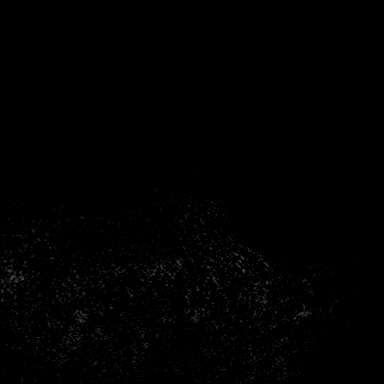
[im 44/176]
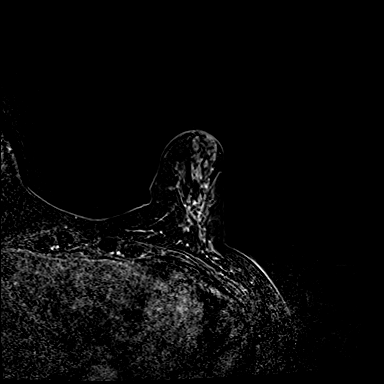
[im 88/176]
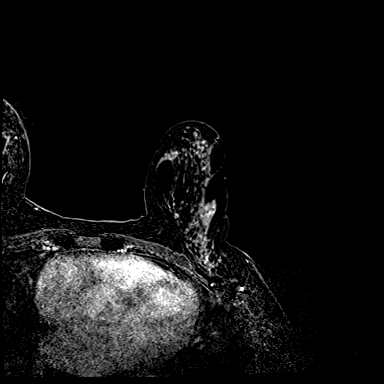
[im 132/176]
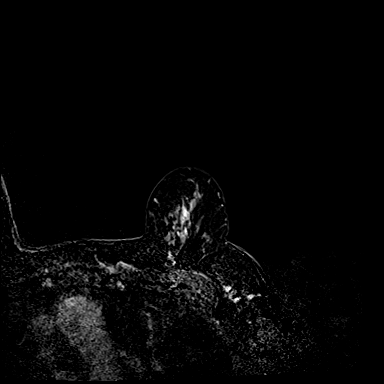
[im 176/176]
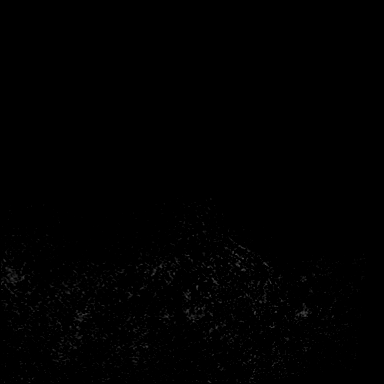

[Series 8: needle confirmation · axial · 1.3mm · 0.73mm/px · z∈[-117,+53]mm · 4 of 176 slices shown]
[im 1/176]
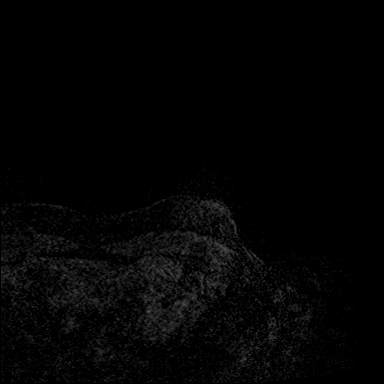
[im 44/176]
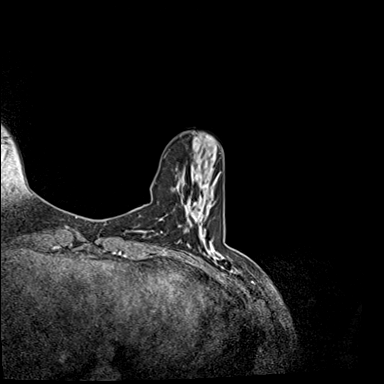
[im 88/176]
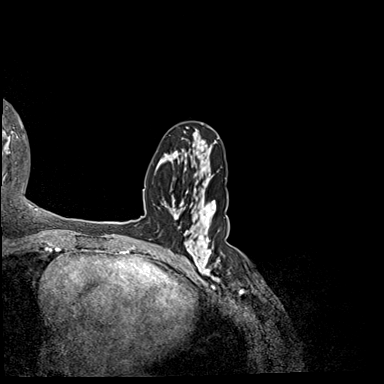
[im 132/176]
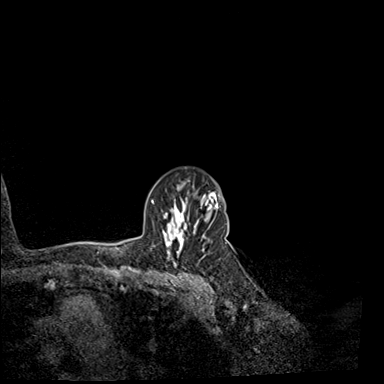

[32 of 48 positions shown; findings below may reference images not displayed]

FINDINGS: I met with the patient, and we discussed the procedure of MRI guided
biopsy, including risks, benefits, and alternatives. Specifically,
we discussed the risks of infection, bleeding, tissue injury, clip
migration, and inadequate sampling. Informed, written consent was
given. The usual time out protocol was performed immediately prior
to the procedure.

Using sterile technique, 1% Lidocaine, MRI guidance, and a 9 gauge
vacuum assisted device, biopsy was performed of a mass in the upper
inner left breast using a lateral approach. At the conclusion of the
procedure, a cylinder shaped tissue marker clip was deployed into
the biopsy cavity.

After the [DATE] of samples, I returned to take additional
samples to ensure adequate sampling of the lesion. A second cylinder
shaped biopsy marking clip was placed in case the first clip placed
was removed with the additional samples.

Follow-up 2-view mammogram was performed and dictated separately.
IMPRESSION: MRI guided biopsy of a mass in the upper inner left breast. No
apparent complications.

ADDENDUM:
Pathology revealed FIBROCYSTIC CHANGE WITH USUAL DUCTAL HYPERPLASIA
AND CALCIFICATIONS, COMPLEX SCLEROSING LESION of the Left breast,
upper inner quadrant. This was found to be concordant by Dr.
ABEL JOSUE, with excision recommended.

Pathology results were discussed with the patient by telephone. The
patient reported doing well after the biopsy with minimal tenderness
at the site. Post biopsy instructions and care were reviewed and
questions were answered. The patient was encouraged to call The
direct phone number was provided.

Surgical consultation has been arranged with Dr. ABEL JOSUE at
[REDACTED] on [DATE].

Pathology results were called to Dr. ABEL JOSUE at [facility]

Pathology results reported by ABEL JOSUE, RN on [DATE].

*** End of Addendum ***
FINDINGS: I met with the patient, and we discussed the procedure of MRI guided
biopsy, including risks, benefits, and alternatives. Specifically,
we discussed the risks of infection, bleeding, tissue injury, clip
migration, and inadequate sampling. Informed, written consent was
given. The usual time out protocol was performed immediately prior
to the procedure.

Using sterile technique, 1% Lidocaine, MRI guidance, and a 9 gauge
vacuum assisted device, biopsy was performed of a mass in the upper
inner left breast using a lateral approach. At the conclusion of the
procedure, a cylinder shaped tissue marker clip was deployed into
the biopsy cavity.

After the [DATE] of samples, I returned to take additional
samples to ensure adequate sampling of the lesion. A second cylinder
shaped biopsy marking clip was placed in case the first clip placed
was removed with the additional samples.

Follow-up 2-view mammogram was performed and dictated separately.
IMPRESSION: MRI guided biopsy of a mass in the upper inner left breast. No
apparent complications.

## 2019-10-25 MED ORDER — GADOBUTROL 1 MMOL/ML IV SOLN
6.0000 mL | Freq: Once | INTRAVENOUS | Status: AC | PRN
  Administered 2019-10-25: 6 mL via INTRAVENOUS

## 2019-10-30 ENCOUNTER — Other Ambulatory Visit: Payer: Self-pay | Admitting: *Deleted

## 2019-11-06 ENCOUNTER — Telehealth (INDEPENDENT_AMBULATORY_CARE_PROVIDER_SITE_OTHER): Payer: No Typology Code available for payment source | Admitting: Obstetrics & Gynecology

## 2019-11-06 ENCOUNTER — Encounter: Payer: Self-pay | Admitting: Obstetrics & Gynecology

## 2019-11-06 ENCOUNTER — Other Ambulatory Visit: Payer: Self-pay

## 2019-11-06 DIAGNOSIS — N6489 Other specified disorders of breast: Secondary | ICD-10-CM

## 2019-11-06 DIAGNOSIS — Z79890 Hormone replacement therapy (postmenopausal): Secondary | ICD-10-CM | POA: Insufficient documentation

## 2019-11-06 NOTE — Progress Notes (Signed)
Virtual Visit via Video Note  I connected with Allison Soto on 11/06/19 at 11:30 AM EDT by a video enabled telemedicine application and verified that I am speaking with the correct person using two identifiers.  Location: Patient: home Provider: office   I discussed the limitations of evaluation and management by telemedicine and the availability of in person appointments. The patient expressed understanding and agreed to proceed.  History of Present Illness: 59 yo G2P2 MWF with increased lifetime risk of breast cancer between 10-20 % who underwent abbreviated breast MRI for additional screening.  Abnormal finding was noted.  Biopsy showed sclerosing lesion.  Pt does have consultation with Dr. Donella Stade on Friday for lumpectomy.  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  We discussed that there is some argument in literature about whether this is a premalignant lesion but there is some thought that is the case, therefore she was recommended to stop her HRT.  She is weaning off as she was concerned about stopping cold [place].  She was on 1.0mg  estradiol and is now on 1/2.  She will go to 1/4 dosage next week and be off within the next two weeks.  Her biggest symptom was body and joint aches prior to starting HRT.  She really did not have a lot of hot flashes or night sweats.  Evaluation for autoimmune disorders prior to that time did not obviously show any other causes.  She wants to discuss what will be done if this starts back.  She currently is on Luvox for history of depression and anxiety so I think we could switch to Cymbalta which would add the decreased pain side effect.  Her primary care doctor, Dr. Jeryl Columbia, writes this prescription for her.  I would not stop Luvox without communicating with her but feel Cymbalta is a good option.  Patient is comfortable with this plan.  We also discussed having a  bone density over the next year.  On the possible chance that her final pathology shows something more significant and she needs additional therapy, we discussed going ahead proceeding with a bone density at this time.  She would like to proceed.  Order will be sent to [facility] mammography for them to call her and get this scheduled.    Observations/Objective: Well-developed white female, no acute distress  Assessment and Plan: Increased lifetime risk of breast cancer Recent biopsy showing complex sclerosing lesion  Follow Up Instructions: Patient will continue to wean off of her HRT She has a consult with Dr. Brantley Stage on Friday, October 2nd Order for bone density will be sent to [facility] mammography today We will need to also remove her MIrena IUD as well.  She is going to wait until after her surgery is scheduled and plan to return to the office for removal.  Pt will call for this herself when ready.   I discussed the assessment and treatment plan with the patient. The patient was provided an opportunity to ask questions and all were answered. The patient agreed with the plan and demonstrated an understanding of the instructions.   The patient was advised to call back or seek an in-person evaluation if the symptoms worsen or if the condition fails to improve as anticipated.  I provided 22 minutes of non-face-to-face time during this encounter.   Megan Salon, MD

## 2019-11-07 ENCOUNTER — Telehealth: Payer: Self-pay | Admitting: Obstetrics & Gynecology

## 2019-11-07 DIAGNOSIS — Z30432 Encounter for removal of intrauterine contraceptive device: Secondary | ICD-10-CM

## 2019-11-07 NOTE — Telephone Encounter (Signed)
Spoke with pt. Pt calling to have IUD removed per Dr Sabra Heck from video visit on 11/06/19. Pt scheduled for IUD removal on 11/16/19 at 4 pm per pt's request of date and time. Pt agreeable and verbalized understanding.   Routing to Dr Sabra Heck for review  Encounter closed  Cc: Alfonse Spruce for precert. Orders placed

## 2019-11-07 NOTE — Telephone Encounter (Signed)
Patient is ready to have her IUD removed .

## 2019-11-07 NOTE — Telephone Encounter (Signed)
Call placed to convey benefits for iud removal. Spoke with the patient and conveyed the benefits. Patient understands/agreeable with the benefits. Patient is aware of the cancellation policy. Appointment scheduled 11/16/19.

## 2019-11-10 ENCOUNTER — Ambulatory Visit: Payer: Self-pay | Admitting: Surgery

## 2019-11-10 DIAGNOSIS — N6489 Other specified disorders of breast: Secondary | ICD-10-CM

## 2019-11-10 NOTE — H&P (Signed)
Allison Soto Appointment: 11/10/2019 9:20 AM Location: [facility] Patient #: [id number] DOB: Jul 10, 1960 Married / Language: Allison Soto / Race: White Female  History of Present Illness Allison Soto; 11/10/2019 11:25 AM) Patient words: Patient sent at the request of the [facility] Allison Soto to chew elevated lifetime risk of breast cancer and family history of breast cancer. Recent magnetic resonance imaging was done which showed an abnormality in the left breast. Core biopsy showed radial scar with fibrocystic change of microcalcifications. Lifetime risk based on family history and personal history he is over 20%. Patient denies history of breast pain nipple discharge or other issue is her breasts.        Family history of breast cancer with her mother diagnosed at age 69 and 1st cousin diagnosed at age 71. Calculated lifetime risk of developing breast cancer of 22.9%.  LABS: None obtained on site today.  EXAM: BILATERAL BREAST MRI WITH AND WITHOUT CONTRAST  TECHNIQUE: Multiplanar, multisequence MR images of both breasts were obtained prior to and following the intravenous administration of 6 ml of Gadavist  Three-dimensional MR images were rendered by post-processing of the original MR data on an independent workstation. The three-dimensional MR images were interpreted, and findings are reported in the following complete MRI report for this study. Three dimensional images were evaluated at the independent interpreting workstation using the Allison Soto thin client.  COMPARISON: Previous mammograms [facility], the most recent dated 08/08/2019.  FINDINGS: Breast composition: c. Heterogeneous fibroglandular tissue.  Background parenchymal enhancement: Moderate to marked  Right breast: Multiple small cysts. No mass or enhancement suspicious for malignancy.  Left breast: Multiple small cysts. There is also a 7 x 5 mm  oval, circumscribed, homogeneously enhancing mass in the upper inner quadrant of the breast in the middle 3rd on image number 48 series 5. This is mildly lobulated and is in approximately the 11 o'clock position. No mammographic correlate.  Lymph nodes: No abnormal appearing lymph nodes.  Ancillary findings: None.  IMPRESSION: 1. 7 x 5 mm indeterminate oval enhancing mass in approximately the 11 o'clock position of the left breast. 2. Multiple small bilateral breast cysts.  RECOMMENDATION: Focused ultrasound of the upper inner quadrant of the left breast to determine if the 7 x 5 mm mass can be visualized with ultrasound for biopsy purposes. If this cannot be visualized with ultrasound, MR guided core needle biopsy this mass would be recommended.  BI-RADS CATEGORY 4: Suspicious.   Electronically Signed By: Allison Revering M.D. 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 This was found to be concordant by Dr. Ammie Soto, with excision recommended.  Pathology results were discussed with the patient by telephone. The patient reported doing well after the biopsy with minimal tenderness at the site. Post biopsy instructions and care were reviewed and questions were answered. The patient was encouraged to call [facility] for any additional concerns. My direct phone number was provided.  Surgical consultation has been arranged with Allison Soto at [facility] on November 10, 2019.  Pathology results were called to Dr. Emmit Soto at [facility] in [place], [place] on October 26, 2019.  Pathology results reported by Allison Soto on 10/26/2019.          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 SEE NOTE - COMPLEX SCLEROSING LESION - NEGATIVE FOR CARCINOMA.  The patient is a 59 year old female.   Past Surgical History Allison Soto; 11/10/2019 9:24 AM) Cesarean Section - Multiple  Diagnostic Studies History Allison Soto; 11/10/2019 9:24 AM) Colonoscopy 1-5 years ago Mammogram within last year Pap Smear 1-5 years ago  Allergies Allison Soto; 11/10/2019 9:25 AM) Penicillins SHELLFISH Allergies Reconciled  Medication History Allison Soto; 11/10/2019 9:27 AM) Estradiol (1MG  Tablet, Oral) Active. Vitamin D (1000UNIT Tablet, Oral) Active. CoQ10 (200MG  Capsule, Oral) Active. Dexilant (60MG  Capsule DR, Oral) Active. Crestor (5MG  Tablet, Oral) Active. fluvoxaMINE Maleate (100MG  Tablet, Oral) Active. traZODone HCl (50MG  Tablet, Oral) Active. Symbicort (160-4.5MCG/ACT Aerosol, Inhalation) Active. Medications Reconciled  Social History Allison Soto; 11/10/2019 9:24 AM) Alcohol use Occasional alcohol use. Caffeine use Tea. No drug use Tobacco use Never smoker.  Family History Allison Soto; 11/10/2019 9:24 AM) Breast Cancer Mother. Cerebrovascular Accident Father. Depression Mother. Hypertension Father, Mother. Thyroid problems Mother.  Pregnancy / Birth History Allison Soto; 11/10/2019 9:24 AM) Age at menarche 43 years. Age of menopause 70-59 Contraceptive History Intrauterine device. Gravida 2 Irregular periods Length (months) of breastfeeding 7-12 Maternal age 27-35 Para 2  Other Problems Allison Soto; 11/10/2019 9:24 AM) Asthma Depression Gastroesophageal Reflux Disease Hypercholesterolemia Lump In Breast     Review of Systems Allison Soto; 11/10/2019 9:24 AM) General Not Present- Appetite Loss, Chills, Fatigue, Fever, Night Sweats, Weight Gain and Weight Loss. Skin Not Present-  Change in Wart/Mole, Dryness, Hives, Jaundice, New Lesions, Non-Healing Wounds, Rash and Ulcer. HEENT Present- Wears glasses/contact lenses. Not Present- Earache, Hearing Loss, Hoarseness, Nose Bleed, Oral Ulcers, Ringing in the Ears, Seasonal Allergies, Sinus Pain, Sore Throat, Visual Disturbances and Yellow Eyes. Respiratory Present- Snoring. Not Present- Bloody sputum, Chronic Cough, Difficulty Breathing and Wheezing. Breast Present- Breast Mass. Not Present- Breast Pain, Nipple Discharge and Skin Changes. Cardiovascular Not Present- Chest Pain, Difficulty Breathing Lying Down, Leg Cramps, Palpitations, Rapid Heart Rate, Shortness of Breath and Swelling of Extremities. Gastrointestinal Not Present- Abdominal Pain, Bloating, Bloody Stool, Change in Bowel Habits, Chronic diarrhea, Constipation, Difficulty Swallowing, Excessive gas, Gets full quickly at meals, Hemorrhoids, Indigestion, Nausea, Rectal Pain and Vomiting. Female Genitourinary Not Present- Frequency, Nocturia, Painful Urination, Pelvic Pain and Urgency. Musculoskeletal Not Present- Back Pain, Joint Pain, Joint Stiffness, Muscle Pain, Muscle Weakness and Swelling of Extremities. Neurological Not Present- Decreased Memory, Fainting, Headaches, Numbness, Seizures, Tingling, Tremor, Trouble walking and Weakness. Psychiatric Not Present- Anxiety, Bipolar, Change in Sleep Pattern, Depression, Fearful and Frequent crying. Endocrine Not Present- Cold Intolerance, Excessive Hunger, Hair Changes, Heat Intolerance, Hot flashes and New Diabetes. Hematology Not Present- Blood Thinners, Easy Bruising, Excessive bleeding, Gland problems, HIV and Persistent Infections.  Vitals Dorinda Stehr Soto; 11/10/2019 9:25 AM) 11/10/2019 9:25 AM Weight: 145.4 lb Height: 65in Body Surface Area: 1.73 m Body Mass Index: 24.2 kg/m  Temp.: 98.90F  Pulse: 82 (Regular)  BP: 124/80(Sitting, Left Arm, Standard)        Physical Exam (Daelyn Mozer A.  Shristi Scheib Soto; 11/10/2019 11:25 AM)  General Mental Status-Alert. General Appearance-Consistent with stated age. Hydration-Well hydrated. Voice-Normal.  Head and Neck Head-normocephalic, atraumatic with no lesions or palpable masses. Trachea-midline. Thyroid Gland Characteristics - normal size and consistency.  Eye Eyeball - Bilateral-Extraocular movements intact. Sclera/Conjunctiva - Bilateral-No scleral icterus.  Chest and Lung Exam Chest and lung exam reveals -quiet, even and easy respiratory effort with  no use of accessory muscles and on auscultation, normal breath sounds, no adventitious sounds and normal vocal resonance. Inspection Chest Wall - Normal. Back - normal.  Breast Breast - Left-Symmetric, Non Tender, No Biopsy scars, no Dimpling - Left, No Inflammation, No Lumpectomy scars, No Mastectomy scars, No Peau d' Orange. Breast - Right-Symmetric, Non Tender, No Biopsy scars, no Dimpling - Right, No Inflammation, No Lumpectomy scars, No Mastectomy scars, No Peau d' Orange. Breast Lump-No Palpable Breast Mass.  Cardiovascular Cardiovascular examination reveals -normal heart sounds, regular rate and rhythm with no murmurs and normal pedal pulses bilaterally.  Abdomen Inspection Inspection of the abdomen reveals - No Hernias. Skin - Scar - no surgical scars. Palpation/Percussion Palpation and Percussion of the abdomen reveal - Soft, Non Tender, No Rebound tenderness, No Rigidity (guarding) and No hepatosplenomegaly. Auscultation Auscultation of the abdomen reveals - Bowel sounds normal.  Neurologic Neurologic evaluation reveals -alert and oriented x 3 with no impairment of recent or remote memory. Mental Status-Normal.  Musculoskeletal Normal Exam - Left-Upper Extremity Strength Normal and Lower Extremity Strength Normal. Normal Exam - Right-Upper Extremity Strength Normal and Lower Extremity Strength Normal.  Lymphatic Head &  Neck  General Head & Neck Lymphatics: Bilateral - Description - Normal. Axillary  General Axillary Region: Bilateral - Description - Normal. Tenderness - Non Tender. Femoral & Inguinal  Generalized Femoral & Inguinal Lymphatics: Bilateral - Description - Normal. Tenderness - Non Tender.    Assessment & Plan (Dezmen Alcock A. 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 She also has a strong family history and lifetime risk of 20%. Risk of lumpectomy include bleeding, infection, seroma, more surgery, use of seed/wire, wound care, cosmetic deformity and the need for other treatments, death , blood clots, death. Pt agrees to proceed.  Current Plans Pt Education - [facility] Breast Biopsy HCI: discussed with patient and provided information. You are being scheduled for surgery- Our schedulers will call you.  You should hear from our office's scheduling department within 5 working days about the location, date, and time of surgery. We try to make accommodations for patient's preferences in scheduling surgery, but sometimes the OR schedule or the surgeon's schedule prevents [place] from making those accommodations.  If you have not heard from our office [phone]) in 5 working days, call the office and ask for your surgeon's nurse.  If you have other questions about your diagnosis, plan, or surgery, call the office and ask for your surgeon's nurse.   AT HIGH RISK FOR BREAST CANCER (Z91.89) Impression: Discussed risk reducing measures such as tamoxifen/walks with an and increased imaging and/or magnetic resonance imaging imaging yearly. Discussed risk reducing surgery the pros and cons of all the above. Discussed dietary modifications, activity modifications and other things to reduce risk. Now she will proceed with seed  localized left breast lumpectomy and consider her options. Risk of lumpectomy include bleeding, infection, seroma, more surgery, use of seed/wire, wound care, cosmetic deformity and the need for other treatments, death , blood clots, death. Pt agrees to proceed.

## 2019-11-16 ENCOUNTER — Ambulatory Visit (INDEPENDENT_AMBULATORY_CARE_PROVIDER_SITE_OTHER): Payer: No Typology Code available for payment source | Admitting: Obstetrics & Gynecology

## 2019-11-16 ENCOUNTER — Other Ambulatory Visit: Payer: Self-pay

## 2019-11-16 ENCOUNTER — Encounter: Payer: Self-pay | Admitting: Obstetrics & Gynecology

## 2019-11-16 DIAGNOSIS — Z30432 Encounter for removal of intrauterine contraceptive device: Secondary | ICD-10-CM | POA: Diagnosis not present

## 2019-11-16 NOTE — Progress Notes (Signed)
59 y.o. G37P2002 Married Caucasian female presents for removal of Mirena IUD.  She's been diagnosed with a radial scar and having a lumpectomy for treatment.  Is stopping her HRT.  Is down to 1/4 dosing.  Has seen breast surgeon.   Awaiting call from scheduler for surgical date of lumpectomy.  LMP:  No LMP recorded. (Menstrual status: IUD).  Patient Active Problem List   Diagnosis Date Noted  . 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 Body aches 05/22/2015  . Fibroids, intramural 02/06/2015  . Other malaise and fatigue 09/27/2013  . Elevated glucose 09/27/2013   Past Medical History:  Diagnosis Date  . Allergy   . Anemia   . Anxiety   . Arthritis   . Asthma   . Depression   . GERD (gastroesophageal reflux disease)   . Gestational diabetes    prediabetes- lost weight- none now- A1C 5.5  . Hyperlipidemia   . Pneumonia 1987  . Radial scar of breast   . Reflux   . Single cyst of left breast 02/01/12   Diagnostic mammogram and ultrasound - cyst at 2:30   Current Outpatient Medications on File Prior to Visit  Medication Sig Dispense Refill  . albuterol (PROAIR HFA) 108 (90 Base) MCG/ACT inhaler Inhale 2 puffs into the lungs every 4 (four) hours as needed for wheezing or shortness of breath. 1 Inhaler 3  . budesonide-formoterol (SYMBICORT) 160-4.5 MCG/ACT inhaler Inhale 2 puffs into the lungs 2 (two) times daily. Reported on 05/21/2015 1 Inhaler 11  . Cholecalciferol (VITAMIN D-3) 1000 units CAPS Take 1 capsule by mouth daily.    . Coenzyme Q10 (CO Q 10 PO) Take by mouth.    . CRESTOR 5 MG tablet Take 5 mg by mouth every evening.  12  . dexlansoprazole (DEXILANT) 60 MG capsule Take one capsule every morning 30 capsule 3  . EPINEPHrine (EPIPEN 2-PAK) 0.3 mg/0.3 mL IJ SOAJ injection Inject 0.3 mLs (0.3 mg total) into the muscle once. 2 Device 2  . fluvoxaMINE (LUVOX) 100 MG tablet Take 100 mg by mouth at bedtime. Taking 150mg  daily    . levonorgestrel (MIRENA) 20 MCG/24HR IUD 1 each by  Intrauterine route once. Place 12/08/2010.    . traZODone (DESYREL) 50 MG tablet Take 50 mg by mouth at bedtime.      Current Facility-Administered Medications on File Prior to Visit  Medication Dose Route Frequency Provider Last Rate Last Admin  . 0.9 %  sodium chloride infusion  500 mL Intravenous Once Jackquline Denmark, MD       Shellfish allergy and Penicillins  ROS Vitals:   11/16/19 1551  BP: 108/68  Pulse: 68  Resp: 16  Weight: 144 lb (65.3 kg)    Gen:  WNWF healthy female NAD Abdomen: soft, non-tender  Pelvic exam: Vulva:  normal female genitalia Vagina:  normal vagina, no discharge, exudate, lesion, or erythema Cervix:  Non-tender, Negative CMT, no lesions or redness, IUD string noted Uterus:  normal shape, position and consistency   Procedure:  Speculum reinserted.  Cervix visualized and cleansed with Betadine x 3.  IUD string noted and grasped with ringed forcep.  With one pull, IUD removed easily.  Pt has some mild cramping but tolerated this well.  A: Removal of Mirena IUD Recent diagnosis of radial scar, lumpectomy is planned  P:  Return prn and/or for AEX.

## 2019-11-17 ENCOUNTER — Telehealth: Payer: Self-pay

## 2019-11-17 NOTE — Telephone Encounter (Signed)
Patient notified that bone density is normal. Follow up 3-29yrs.

## 2019-11-21 ENCOUNTER — Encounter: Payer: Self-pay | Admitting: Obstetrics & Gynecology

## 2019-11-22 ENCOUNTER — Other Ambulatory Visit: Payer: Self-pay | Admitting: Surgery

## 2019-11-22 DIAGNOSIS — N6489 Other specified disorders of breast: Secondary | ICD-10-CM

## 2019-11-28 ENCOUNTER — Encounter: Payer: Self-pay | Admitting: Obstetrics & Gynecology

## 2019-12-14 ENCOUNTER — Other Ambulatory Visit: Payer: Self-pay

## 2019-12-14 ENCOUNTER — Encounter (HOSPITAL_BASED_OUTPATIENT_CLINIC_OR_DEPARTMENT_OTHER): Payer: Self-pay | Admitting: Surgery

## 2019-12-18 ENCOUNTER — Encounter (HOSPITAL_BASED_OUTPATIENT_CLINIC_OR_DEPARTMENT_OTHER)
Admission: RE | Admit: 2019-12-18 | Discharge: 2019-12-18 | Disposition: A | Discharge status: Home / Self Care | Payer: No Typology Code available for payment source | Source: Ambulatory Visit | Attending: Surgery | Admitting: Surgery

## 2019-12-18 ENCOUNTER — Other Ambulatory Visit (HOSPITAL_COMMUNITY)
Admission: RE | Admit: 2019-12-18 | Discharge: 2019-12-18 | Disposition: A | Discharge status: Home / Self Care | Payer: No Typology Code available for payment source | Source: Ambulatory Visit | Attending: Surgery | Admitting: Surgery

## 2019-12-18 DIAGNOSIS — N6489 Other specified disorders of breast: Secondary | ICD-10-CM | POA: Insufficient documentation

## 2019-12-18 DIAGNOSIS — Z01812 Encounter for preprocedural laboratory examination: Secondary | ICD-10-CM | POA: Diagnosis present

## 2019-12-18 LAB — CBC WITH DIFFERENTIAL/PLATELET
Abs Immature Granulocytes: 0.02 10*3/uL (ref 0.00–0.07)
Basophils Absolute: 0.1 10*3/uL (ref 0.0–0.1)
Basophils Relative: 1 %
Eosinophils Absolute: 0.1 10*3/uL (ref 0.0–0.5)
Eosinophils Relative: 1 %
HCT: 47.5 % — ABNORMAL HIGH (ref 36.0–46.0)
Hemoglobin: 15.6 g/dL — ABNORMAL HIGH (ref 12.0–15.0)
Immature Granulocytes: 0 %
Lymphocytes Relative: 24 %
Lymphs Abs: 1.3 10*3/uL (ref 0.7–4.0)
MCH: 32 pg (ref 26.0–34.0)
MCHC: 32.8 g/dL (ref 30.0–36.0)
MCV: 97.3 fL (ref 80.0–100.0)
Monocytes Absolute: 0.5 10*3/uL (ref 0.1–1.0)
Monocytes Relative: 9 %
Neutro Abs: 3.5 10*3/uL (ref 1.7–7.7)
Neutrophils Relative %: 65 %
Platelets: 247 10*3/uL (ref 150–400)
RBC: 4.88 MIL/uL (ref 3.87–5.11)
RDW: 11.3 % — ABNORMAL LOW (ref 11.5–15.5)
WBC: 5.4 10*3/uL (ref 4.0–10.5)
nRBC: 0 % (ref 0.0–0.2)

## 2019-12-18 LAB — COMPREHENSIVE METABOLIC PANEL
ALT: 32 U/L (ref 0–44)
AST: 29 U/L (ref 15–41)
Albumin: 3.7 g/dL (ref 3.5–5.0)
Alkaline Phosphatase: 44 U/L (ref 38–126)
Anion gap: 8 (ref 5–15)
BUN: 12 mg/dL (ref 6–20)
CO2: 29 mmol/L (ref 22–32)
Calcium: 9.4 mg/dL (ref 8.9–10.3)
Chloride: 103 mmol/L (ref 98–111)
Creatinine, Ser: 0.78 mg/dL (ref 0.44–1.00)
GFR, Estimated: >60 mL/min (ref >60)
Glucose, Bld: 106 mg/dL — ABNORMAL HIGH (ref 70–99)
Potassium: 5 mmol/L (ref 3.5–5.1)
Sodium: 140 mmol/L (ref 135–145)
Total Bilirubin: 0.3 mg/dL (ref 0.3–1.2)
Total Protein: 6.3 g/dL — ABNORMAL LOW (ref 6.5–8.1)

## 2019-12-18 NOTE — Progress Notes (Signed)

## 2019-12-19 ENCOUNTER — Other Ambulatory Visit (HOSPITAL_COMMUNITY)
Admission: RE | Admit: 2019-12-19 | Discharge: 2019-12-19 | Disposition: A | Discharge status: Home / Self Care | Payer: No Typology Code available for payment source | Source: Ambulatory Visit | Attending: Surgery | Admitting: Surgery

## 2019-12-19 DIAGNOSIS — Z01812 Encounter for preprocedural laboratory examination: Secondary | ICD-10-CM | POA: Insufficient documentation

## 2019-12-19 DIAGNOSIS — Z20822 Contact with and (suspected) exposure to covid-19: Secondary | ICD-10-CM | POA: Insufficient documentation

## 2019-12-19 LAB — SARS CORONAVIRUS 2 (TAT 6-24 HRS): SARS Coronavirus 2: NEGATIVE

## 2019-12-19 NOTE — Progress Notes (Signed)
Patient came on yesterday 11/8 for her covid test for her procedure on 11/11 with Dr. Brantley Stage. However, her sample was lost by the [facility] lab, per the lab, and she will be returning today 11/9 for a retest at 1430.

## 2019-12-20 ENCOUNTER — Other Ambulatory Visit: Payer: Self-pay | Admitting: Surgery

## 2019-12-20 ENCOUNTER — Other Ambulatory Visit: Payer: Self-pay

## 2019-12-20 ENCOUNTER — Ambulatory Visit
Admission: RE | Admit: 2019-12-20 | Discharge: 2019-12-20 | Disposition: A | Discharge status: Home / Self Care | Payer: No Typology Code available for payment source | Source: Ambulatory Visit | Attending: Surgery | Admitting: Surgery

## 2019-12-20 DIAGNOSIS — Z853 Personal history of malignant neoplasm of breast: Secondary | ICD-10-CM

## 2019-12-20 DIAGNOSIS — N6489 Other specified disorders of breast: Secondary | ICD-10-CM

## 2019-12-20 DIAGNOSIS — M79622 Pain in left upper arm: Secondary | ICD-10-CM

## 2019-12-20 IMAGING — MG MM PLC BREAST LOC DEV 1ST LESION INC MAMMO GUIDE*L*
7 series · 7 of 7 positions shown · non-contrast
Comparison: Previous exam(s).

CLINICAL DATA: Patient presents for seed localization prior to LEFT
excision. Recent MR guided core biopsy of the LEFT breast shows
complex sclerosing lesion in the UPPER INNER QUADRANT.

EXAM:
MAMMOGRAPHIC GUIDED RADIOACTIVE SEED LOCALIZATION OF THE LEFT BREAST

[L ML (1 of 3)]
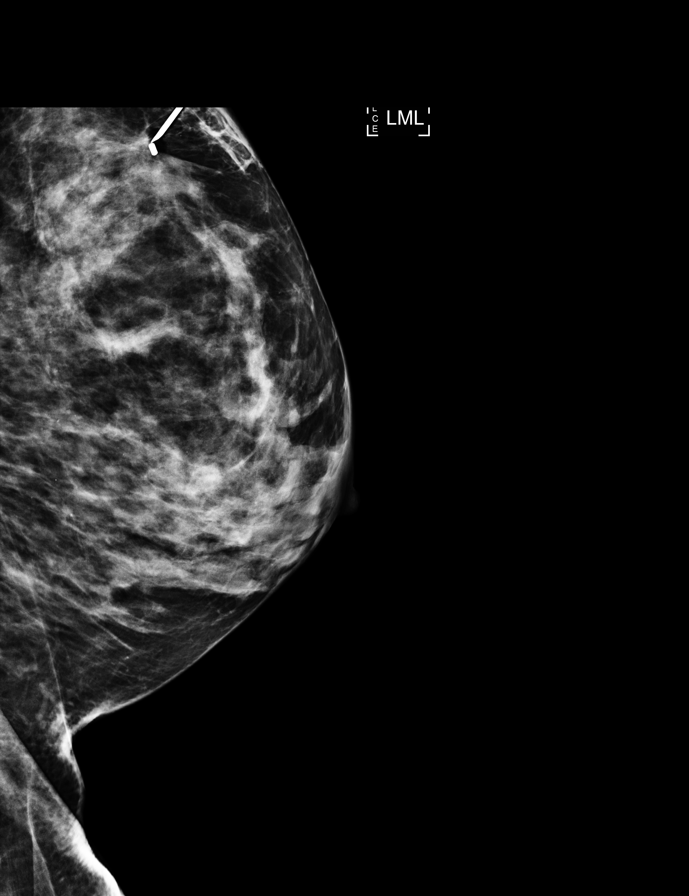

[L CC (1 of 4)]
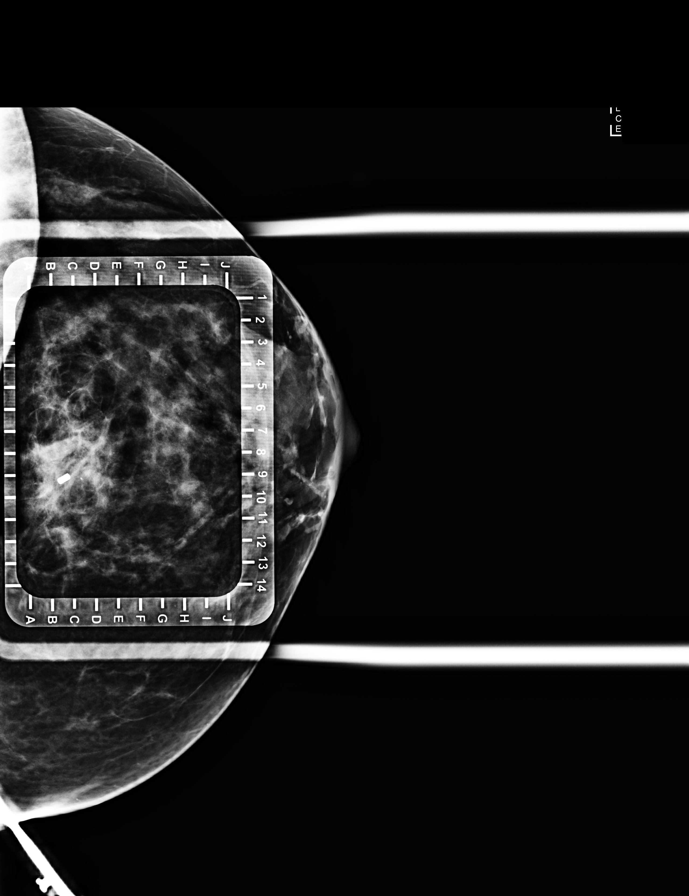

[L ML (2 of 3)]
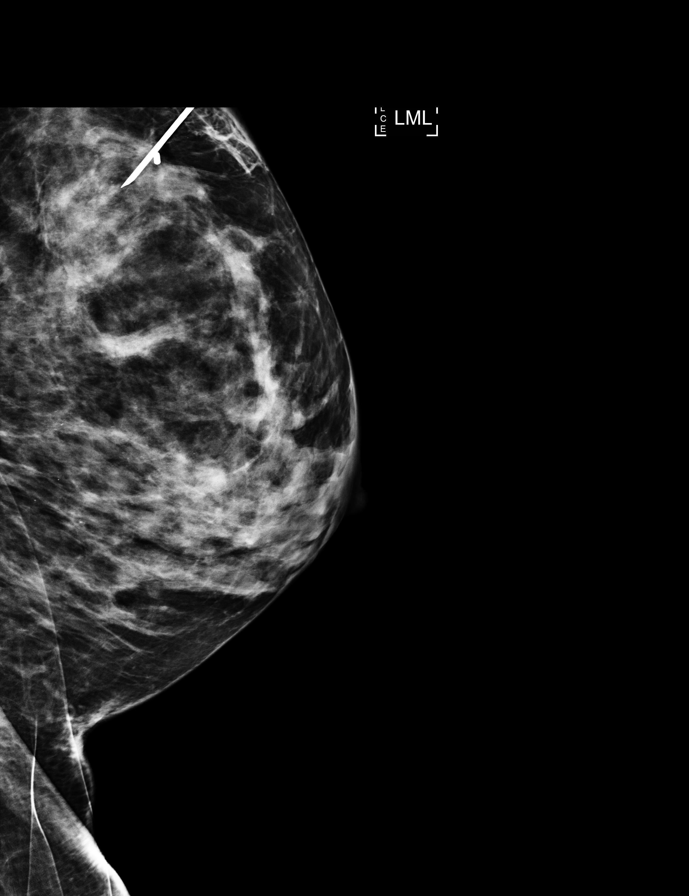

[L CC (2 of 4)]
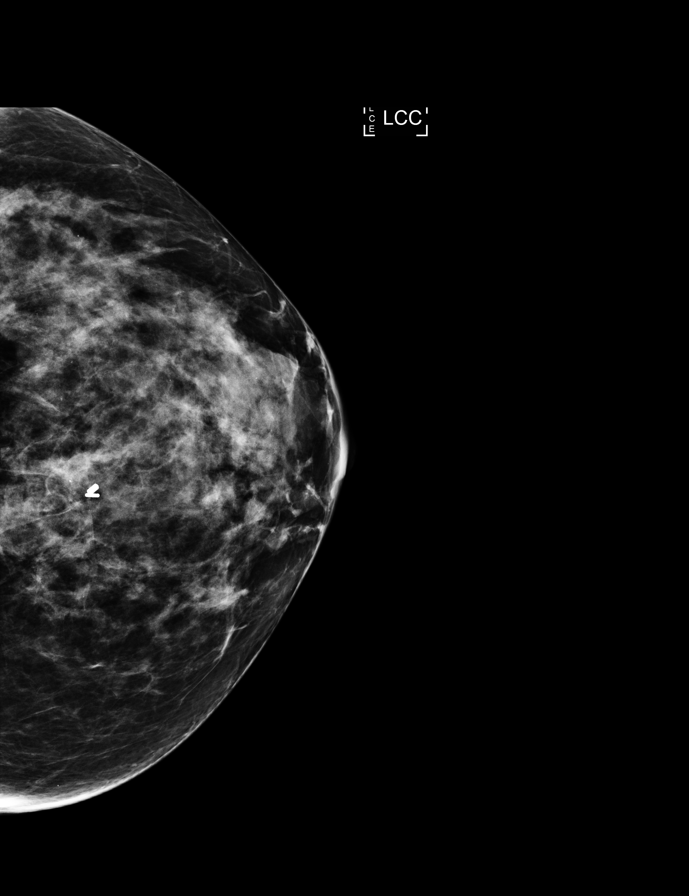

[L ML (3 of 3)]
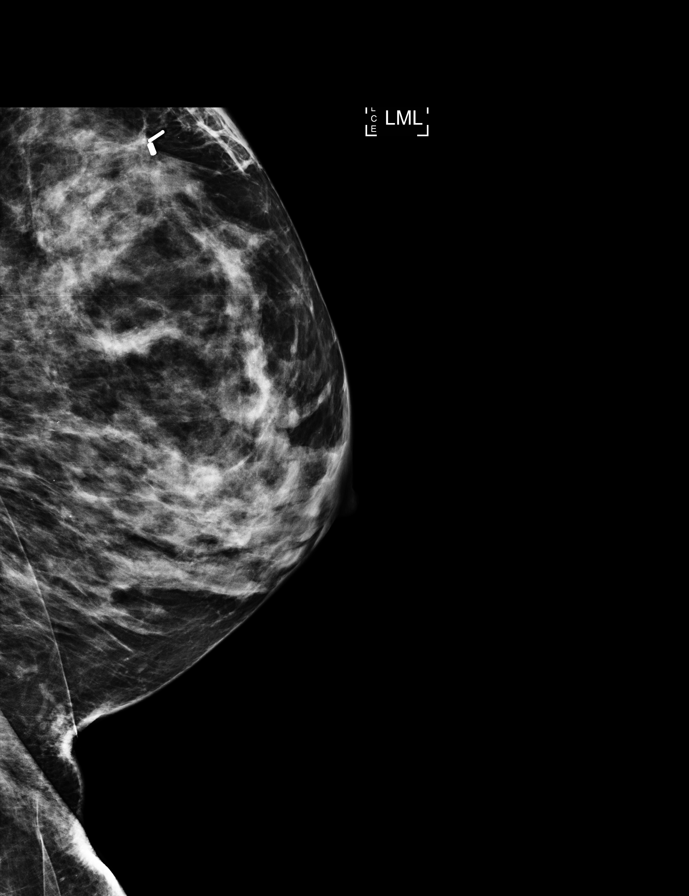

[L CC (3 of 4)]
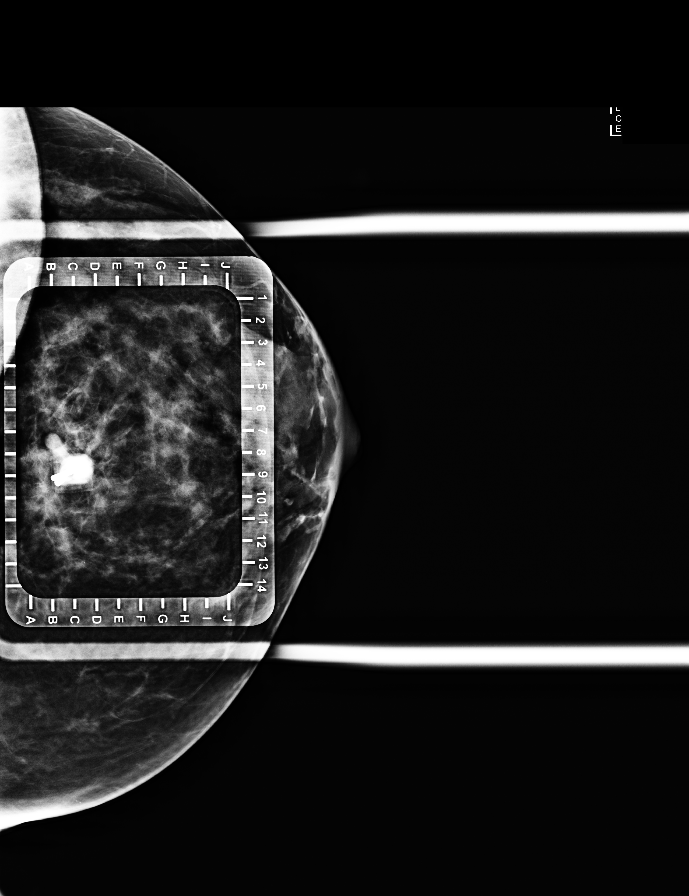

[L CC (4 of 4)]
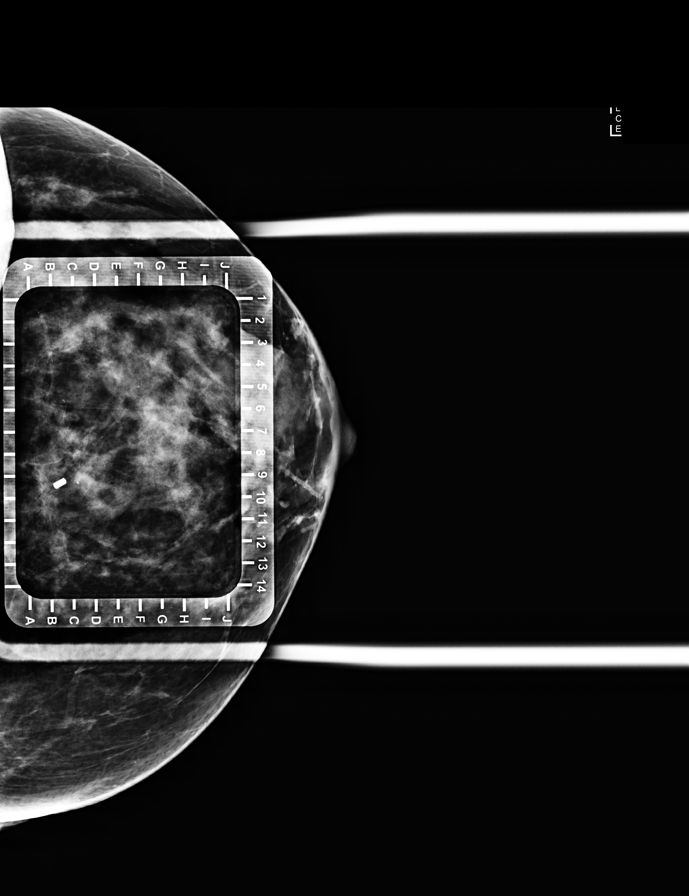

[7 of 7 positions shown; findings below may reference images not displayed]

FINDINGS: Patient presents for radioactive seed localization prior to
excision. I met with the patient and we discussed the procedure of
seed localization including benefits and alternatives. We discussed
the high likelihood of a successful procedure. We discussed the
risks of the procedure including infection, bleeding, tissue injury
and further surgery. We discussed the low dose of radioactivity
involved in the procedure. Informed, written consent was given.

The usual time-out protocol was performed immediately prior to the
procedure.

Using mammographic guidance, sterile technique, 1% lidocaine and an
[VB] radioactive seed, the cylinder-shaped clip in the UPPER INNER
QUADRANT of the LEFT breast was localized using a craniocaudal
approach. The follow-up mammogram images confirm the seed in the
expected location and were marked for Dr. YEY JOSUE.

Follow-up survey of the patient confirms presence of the radioactive
seed.

Order number of [VB] seed:  [PHONE_NUMBER].

Total activity:  0.241 millicuries reference Date: [DATE]

The patient tolerated the procedure well and was released from the
[REDACTED]. She was given instructions regarding seed removal.
IMPRESSION: Radioactive seed localization left breast. No apparent
complications.

## 2019-12-21 ENCOUNTER — Ambulatory Visit (HOSPITAL_BASED_OUTPATIENT_CLINIC_OR_DEPARTMENT_OTHER): Payer: No Typology Code available for payment source | Admitting: Anesthesiology

## 2019-12-21 ENCOUNTER — Ambulatory Visit (HOSPITAL_BASED_OUTPATIENT_CLINIC_OR_DEPARTMENT_OTHER)
Admission: RE | Admit: 2019-12-21 | Discharge: 2019-12-21 | Disposition: A | Discharge status: Home / Self Care | Payer: No Typology Code available for payment source | Attending: Surgery | Admitting: Surgery

## 2019-12-21 ENCOUNTER — Other Ambulatory Visit: Payer: Self-pay

## 2019-12-21 ENCOUNTER — Encounter (HOSPITAL_BASED_OUTPATIENT_CLINIC_OR_DEPARTMENT_OTHER): Payer: Self-pay | Admitting: Surgery

## 2019-12-21 ENCOUNTER — Encounter (HOSPITAL_BASED_OUTPATIENT_CLINIC_OR_DEPARTMENT_OTHER)
Admission: RE | Disposition: A | Discharge status: Home / Self Care | Payer: Self-pay | Source: Home / Self Care | Attending: Surgery

## 2019-12-21 ENCOUNTER — Ambulatory Visit
Admission: RE | Admit: 2019-12-21 | Discharge: 2019-12-21 | Disposition: A | Discharge status: Home / Self Care | Payer: No Typology Code available for payment source | Source: Ambulatory Visit | Attending: Surgery | Admitting: Surgery

## 2019-12-21 DIAGNOSIS — N6022 Fibroadenosis of left breast: Secondary | ICD-10-CM | POA: Diagnosis not present

## 2019-12-21 DIAGNOSIS — J45909 Unspecified asthma, uncomplicated: Secondary | ICD-10-CM | POA: Insufficient documentation

## 2019-12-21 DIAGNOSIS — E7800 Pure hypercholesterolemia, unspecified: Secondary | ICD-10-CM | POA: Insufficient documentation

## 2019-12-21 DIAGNOSIS — Z803 Family history of malignant neoplasm of breast: Secondary | ICD-10-CM | POA: Insufficient documentation

## 2019-12-21 DIAGNOSIS — Z8249 Family history of ischemic heart disease and other diseases of the circulatory system: Secondary | ICD-10-CM | POA: Insufficient documentation

## 2019-12-21 DIAGNOSIS — N62 Hypertrophy of breast: Secondary | ICD-10-CM | POA: Diagnosis not present

## 2019-12-21 DIAGNOSIS — Z823 Family history of stroke: Secondary | ICD-10-CM | POA: Insufficient documentation

## 2019-12-21 DIAGNOSIS — Z8349 Family history of other endocrine, nutritional and metabolic diseases: Secondary | ICD-10-CM | POA: Insufficient documentation

## 2019-12-21 DIAGNOSIS — K219 Gastro-esophageal reflux disease without esophagitis: Secondary | ICD-10-CM | POA: Insufficient documentation

## 2019-12-21 DIAGNOSIS — N6489 Other specified disorders of breast: Secondary | ICD-10-CM

## 2019-12-21 DIAGNOSIS — Z79899 Other long term (current) drug therapy: Secondary | ICD-10-CM | POA: Insufficient documentation

## 2019-12-21 DIAGNOSIS — L905 Scar conditions and fibrosis of skin: Secondary | ICD-10-CM | POA: Diagnosis present

## 2019-12-21 DIAGNOSIS — N6002 Solitary cyst of left breast: Secondary | ICD-10-CM | POA: Insufficient documentation

## 2019-12-21 DIAGNOSIS — Z91013 Allergy to seafood: Secondary | ICD-10-CM | POA: Insufficient documentation

## 2019-12-21 DIAGNOSIS — Z818 Family history of other mental and behavioral disorders: Secondary | ICD-10-CM | POA: Insufficient documentation

## 2019-12-21 HISTORY — PX: BREAST LUMPECTOMY WITH RADIOACTIVE SEED LOCALIZATION: SHX6424

## 2019-12-21 HISTORY — DX: Prediabetes: R73.03

## 2019-12-21 IMAGING — MG MM BREAST SURGICAL SPECIMEN
1 series · 2 of 2 positions shown · non-contrast
Comparison: Previous exam(s).

CLINICAL DATA: Patient status post left breast excision.

EXAM:
SPECIMEN RADIOGRAPH OF THE LEFT BREAST

[Series 1: L · left · 0.07mm/px · 2 of 2 slices shown]
[im 1/2]
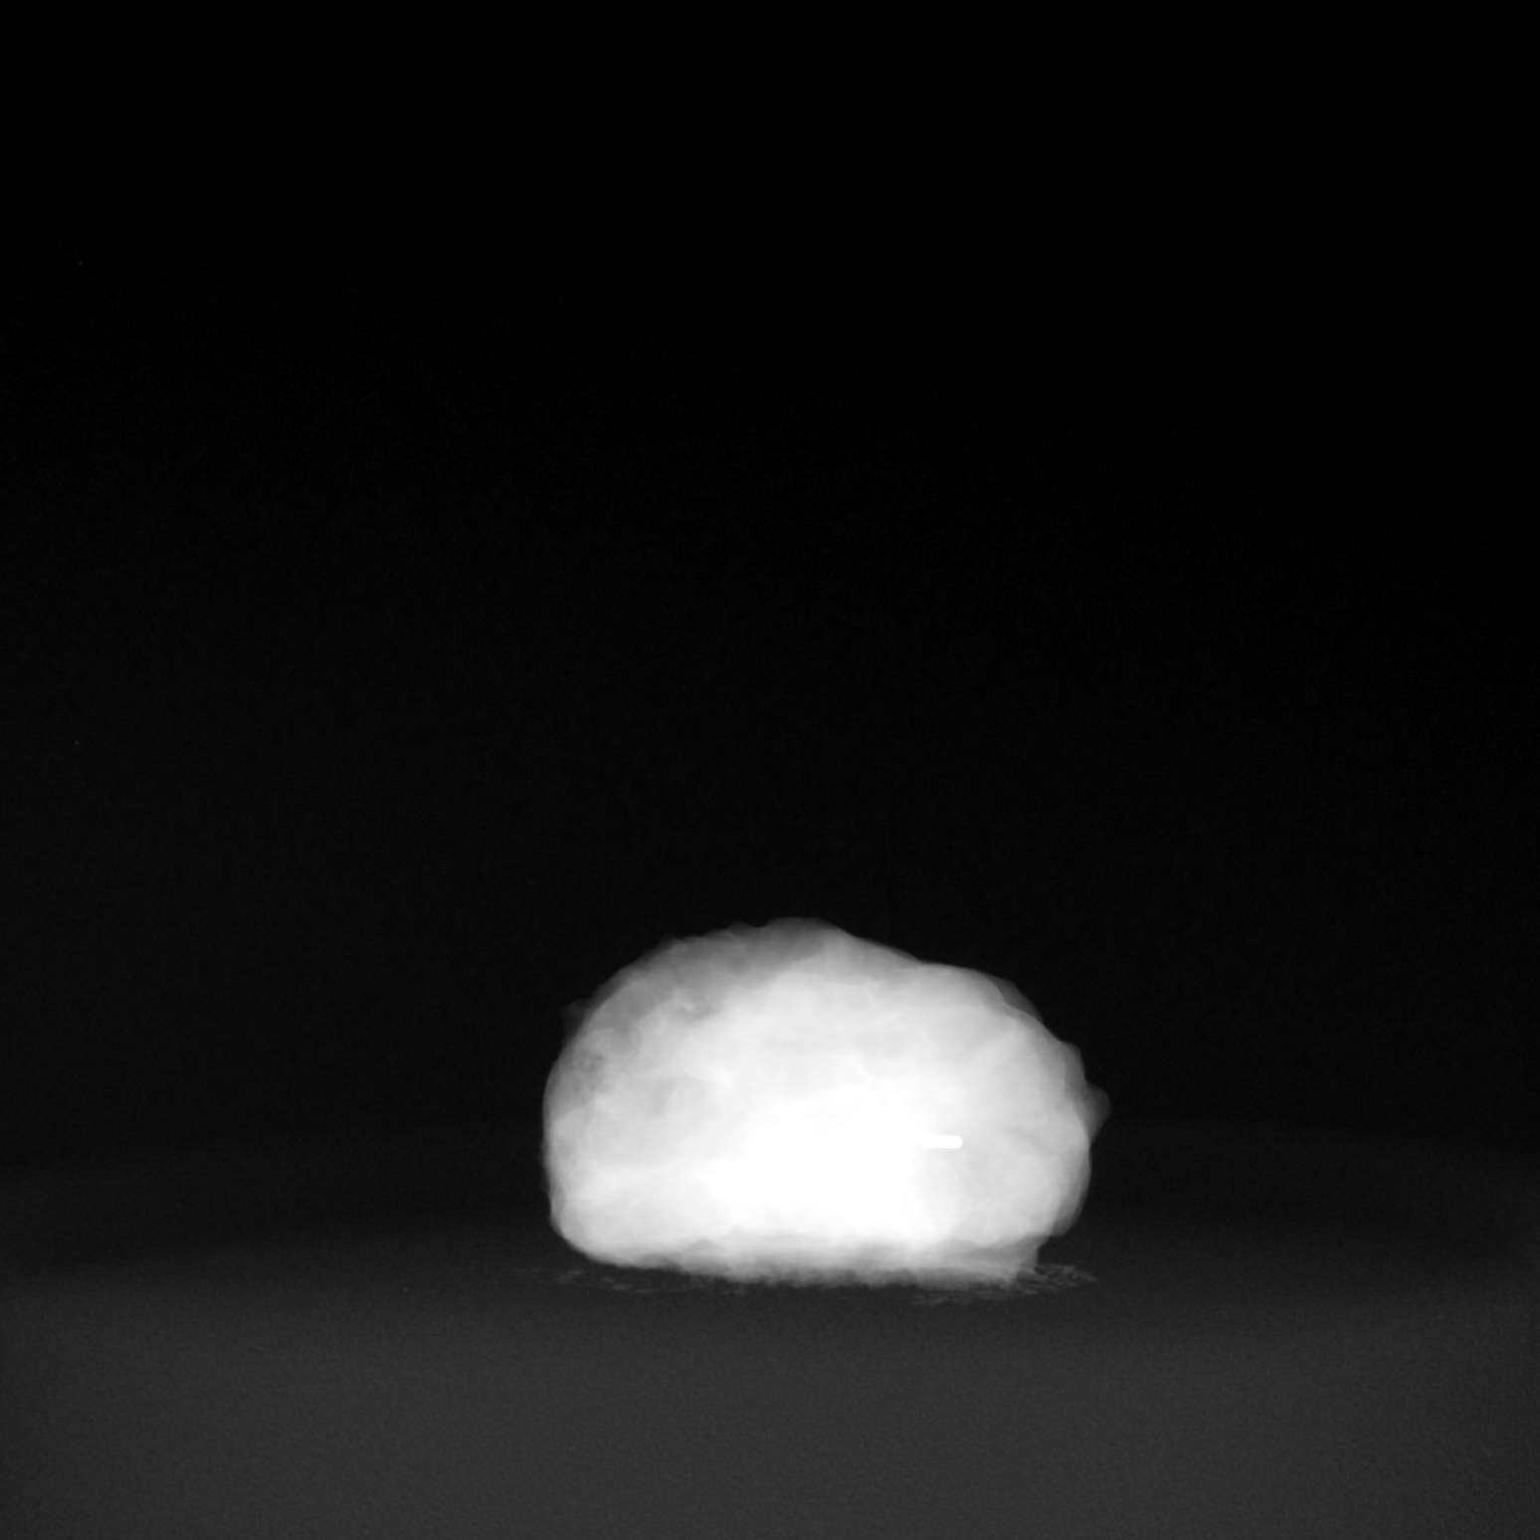
[im 2/2]
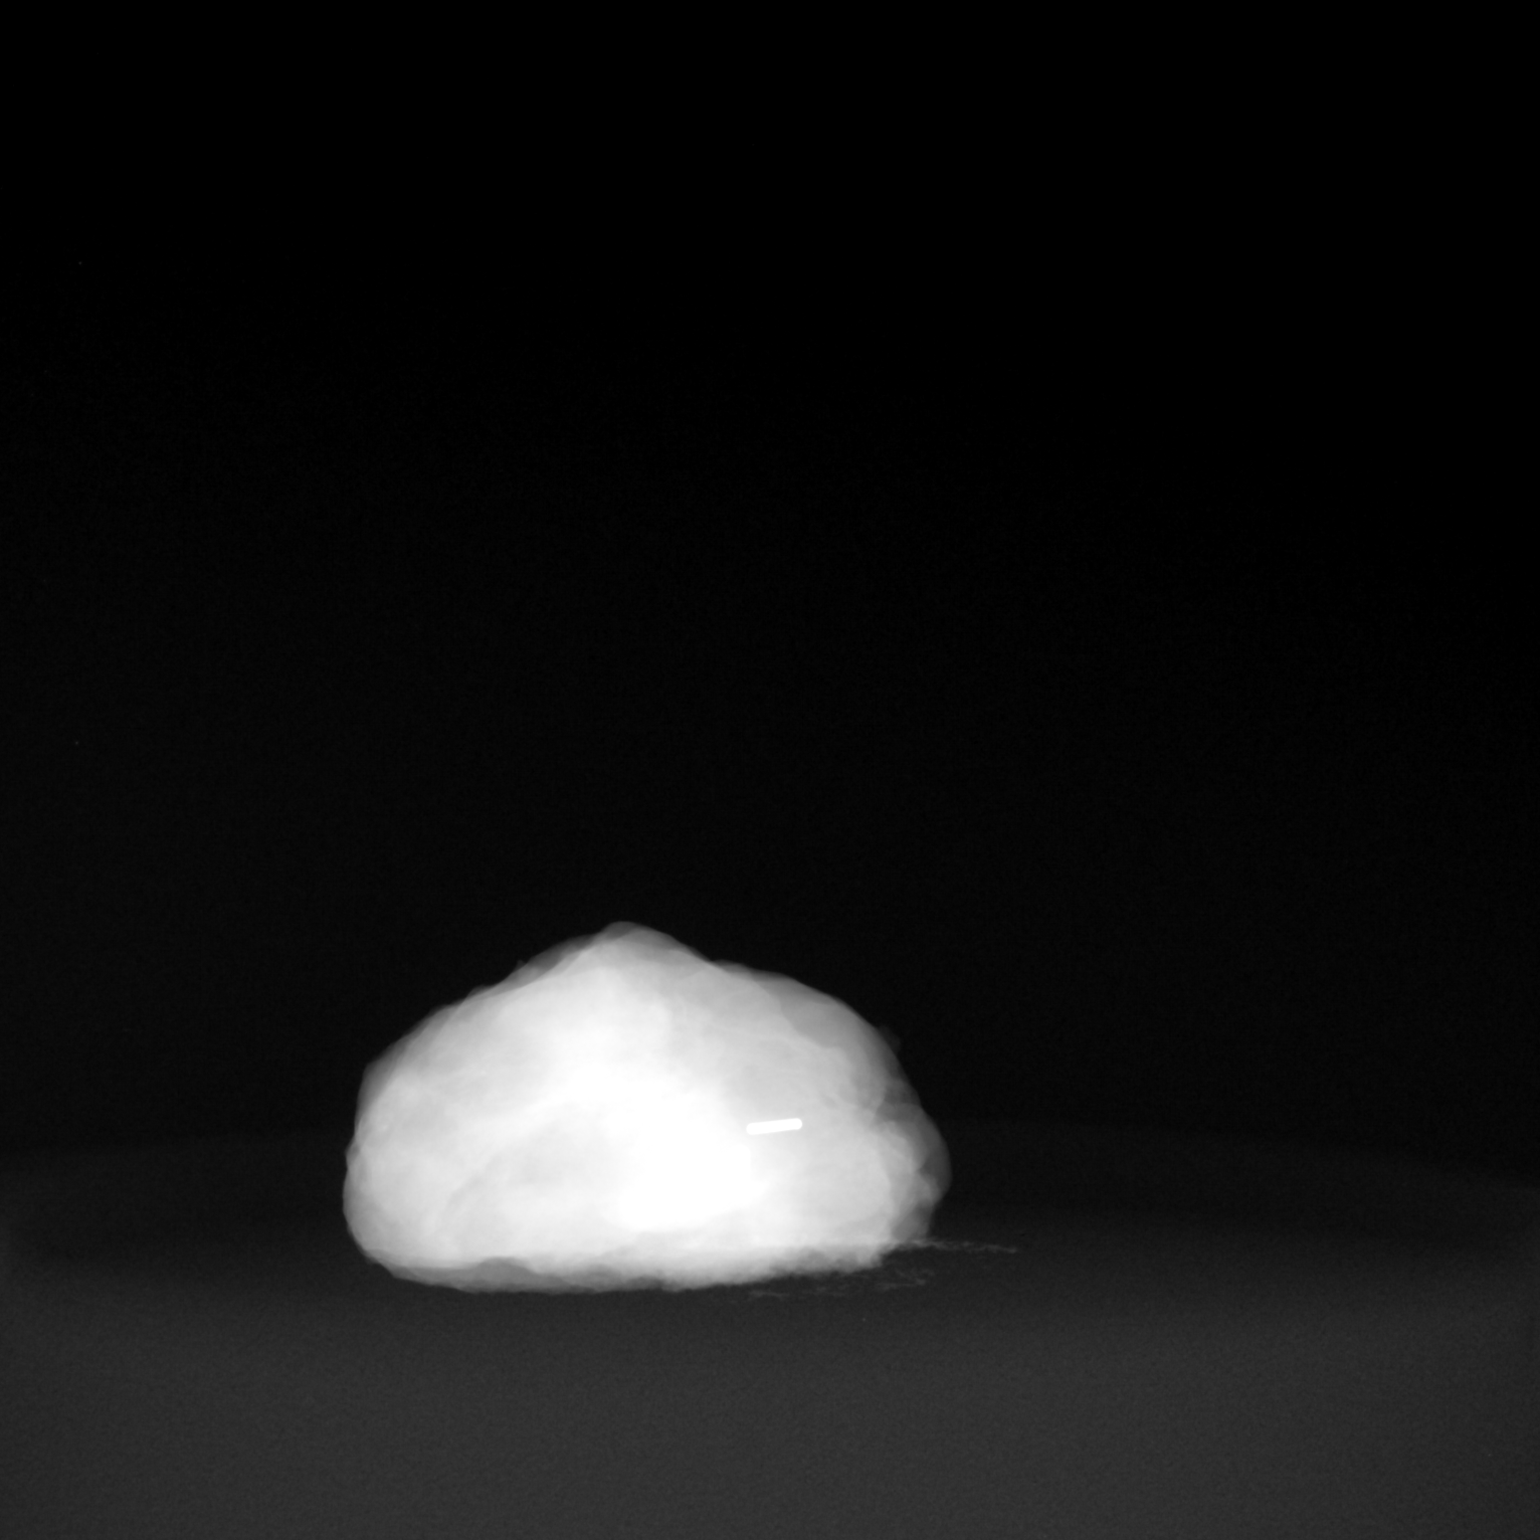

[2 of 2 positions shown; findings below may reference images not displayed]

FINDINGS: Status post excision of the left breast. The radioactive seed and
biopsy marker clip are present and completely intact.
IMPRESSION: Specimen radiograph of the left breast.

## 2019-12-21 SURGERY — BREAST LUMPECTOMY WITH RADIOACTIVE SEED LOCALIZATION
Anesthesia: General | Site: Breast | Laterality: Left

## 2019-12-21 MED ORDER — HEPARIN SOD (PORK) LOCK FLUSH 100 UNIT/ML IV SOLN
INTRAVENOUS | Status: AC
  Filled 2019-12-21: qty 5

## 2019-12-21 MED ORDER — FENTANYL CITRATE (PF) 100 MCG/2ML IJ SOLN
INTRAMUSCULAR | Status: AC
  Filled 2019-12-21: qty 2

## 2019-12-21 MED ORDER — MIDAZOLAM HCL 2 MG/2ML IJ SOLN
INTRAMUSCULAR | Status: AC
  Filled 2019-12-21: qty 2

## 2019-12-21 MED ORDER — SODIUM CHLORIDE (PF) 0.9 % IJ SOLN
INTRAMUSCULAR | Status: AC
  Filled 2019-12-21: qty 20

## 2019-12-21 MED ORDER — DEXAMETHASONE SODIUM PHOSPHATE 10 MG/ML IJ SOLN
INTRAMUSCULAR | Status: DC | PRN
  Administered 2019-12-21: 4 mg via INTRAVENOUS

## 2019-12-21 MED ORDER — PROPOFOL 10 MG/ML IV BOLUS
INTRAVENOUS | Status: AC
  Filled 2019-12-21: qty 20

## 2019-12-21 MED ORDER — LIDOCAINE 2% (20 MG/ML) 5 ML SYRINGE
INTRAMUSCULAR | Status: AC
  Filled 2019-12-21: qty 5

## 2019-12-21 MED ORDER — METHYLENE BLUE 0.5 % INJ SOLN
INTRAVENOUS | Status: AC
  Filled 2019-12-21: qty 20

## 2019-12-21 MED ORDER — CHLORHEXIDINE GLUCONATE CLOTH 2 % EX PADS: 6.0000 | MEDICATED_PAD | Freq: Once | CUTANEOUS | Status: DC

## 2019-12-21 MED ORDER — IBUPROFEN 800 MG PO TABS: 800.0000 mg | ORAL_TABLET | Freq: Three times a day (TID) | ORAL | 0 refills | Status: DC | PRN

## 2019-12-21 MED ORDER — OXYCODONE HCL 5 MG/5ML PO SOLN: 5.0000 mg | Freq: Once | ORAL | Status: DC | PRN

## 2019-12-21 MED ORDER — ONDANSETRON HCL 4 MG/2ML IJ SOLN
INTRAMUSCULAR | Status: AC
  Filled 2019-12-21: qty 2

## 2019-12-21 MED ORDER — LACTATED RINGERS IV SOLN: INTRAVENOUS | Status: DC

## 2019-12-21 MED ORDER — HEPARIN (PORCINE) IN NACL 1000-0.9 UT/500ML-% IV SOLN
INTRAVENOUS | Status: AC
  Filled 2019-12-21: qty 500

## 2019-12-21 MED ORDER — HYDROCODONE-ACETAMINOPHEN 5-325 MG PO TABS: 1.0000 | ORAL_TABLET | Freq: Four times a day (QID) | ORAL | 0 refills | Status: DC | PRN

## 2019-12-21 MED ORDER — ONDANSETRON HCL 4 MG/2ML IJ SOLN
INTRAMUSCULAR | Status: DC | PRN
  Administered 2019-12-21: 4 mg via INTRAVENOUS

## 2019-12-21 MED ORDER — CLINDAMYCIN PHOSPHATE 900 MG/50ML IV SOLN
900.0000 mg | INTRAVENOUS | Status: AC
  Administered 2019-12-21: 900 mg via INTRAVENOUS

## 2019-12-21 MED ORDER — ONDANSETRON HCL 4 MG/2ML IJ SOLN: 4.0000 mg | Freq: Once | INTRAMUSCULAR | Status: DC | PRN

## 2019-12-21 MED ORDER — HYDROMORPHONE HCL 1 MG/ML IJ SOLN: 0.2500 mg | INTRAMUSCULAR | Status: DC | PRN

## 2019-12-21 MED ORDER — BUPIVACAINE HCL (PF) 0.25 % IJ SOLN
INTRAMUSCULAR | Status: AC
  Filled 2019-12-21: qty 150

## 2019-12-21 MED ORDER — BUPIVACAINE HCL (PF) 0.25 % IJ SOLN
INTRAMUSCULAR | Status: DC | PRN
  Administered 2019-12-21: 20 mL

## 2019-12-21 MED ORDER — PROPOFOL 10 MG/ML IV BOLUS
INTRAVENOUS | Status: DC | PRN
  Administered 2019-12-21: 150 mg via INTRAVENOUS

## 2019-12-21 MED ORDER — ACETAMINOPHEN 10 MG/ML IV SOLN: 1000.0000 mg | Freq: Once | INTRAVENOUS | Status: DC | PRN

## 2019-12-21 MED ORDER — FENTANYL CITRATE (PF) 100 MCG/2ML IJ SOLN
INTRAMUSCULAR | Status: DC | PRN
  Administered 2019-12-21: 50 ug via INTRAVENOUS

## 2019-12-21 MED ORDER — AMISULPRIDE (ANTIEMETIC) 5 MG/2ML IV SOLN: 10.0000 mg | Freq: Once | INTRAVENOUS | Status: DC | PRN

## 2019-12-21 MED ORDER — LIDOCAINE 2% (20 MG/ML) 5 ML SYRINGE
INTRAMUSCULAR | Status: DC | PRN
  Administered 2019-12-21: 100 mg via INTRAVENOUS

## 2019-12-21 MED ORDER — CLINDAMYCIN PHOSPHATE 900 MG/50ML IV SOLN
INTRAVENOUS | Status: AC
  Filled 2019-12-21: qty 50

## 2019-12-21 MED ORDER — OXYCODONE HCL 5 MG PO TABS: 5.0000 mg | ORAL_TABLET | Freq: Once | ORAL | Status: DC | PRN

## 2019-12-21 MED ORDER — DEXAMETHASONE SODIUM PHOSPHATE 10 MG/ML IJ SOLN
INTRAMUSCULAR | Status: AC
  Filled 2019-12-21: qty 1

## 2019-12-21 SURGICAL SUPPLY — 54 items
ADH SKN CLS APL DERMABOND .7 (GAUZE/BANDAGES/DRESSINGS) ×1
APL PRP STRL LF DISP 70% ISPRP (MISCELLANEOUS) ×1
APPLIER CLIP 9.375 MED OPEN (MISCELLANEOUS)
APR CLP MED 9.3 20 MLT OPN (MISCELLANEOUS)
BINDER BREAST LRG (GAUZE/BANDAGES/DRESSINGS) IMPLANT
BINDER BREAST MEDIUM (GAUZE/BANDAGES/DRESSINGS) IMPLANT
BINDER BREAST XLRG (GAUZE/BANDAGES/DRESSINGS) IMPLANT
BINDER BREAST XXLRG (GAUZE/BANDAGES/DRESSINGS) IMPLANT
BLADE SURG 15 STRL LF DISP TIS (BLADE) ×1 IMPLANT
BLADE SURG 15 STRL SS (BLADE) ×3
CANISTER SUC SOCK COL 7IN (MISCELLANEOUS) IMPLANT
CANISTER SUCT 1200ML W/VALVE (MISCELLANEOUS) IMPLANT
CHLORAPREP W/TINT 26 (MISCELLANEOUS) ×3 IMPLANT
CLIP APPLIE 9.375 MED OPEN (MISCELLANEOUS) IMPLANT
COVER BACK TABLE 60X90IN (DRAPES) ×3 IMPLANT
COVER MAYO STAND STRL (DRAPES) ×3 IMPLANT
COVER PROBE W GEL 5X96 (DRAPES) ×3 IMPLANT
DECANTER SPIKE VIAL GLASS SM (MISCELLANEOUS) IMPLANT
DERMABOND ADVANCED (GAUZE/BANDAGES/DRESSINGS) ×2
DERMABOND ADVANCED .7 DNX12 (GAUZE/BANDAGES/DRESSINGS) ×1 IMPLANT
DRAPE LAPAROTOMY 100X72 PEDS (DRAPES) ×3 IMPLANT
DRAPE UTILITY XL STRL (DRAPES) ×3 IMPLANT
ELECT COATED BLADE 2.86 ST (ELECTRODE) ×3 IMPLANT
ELECT REM PT RETURN 9FT ADLT (ELECTROSURGICAL) ×3
ELECTRODE REM PT RTRN 9FT ADLT (ELECTROSURGICAL) ×1 IMPLANT
GLOVE BIO SURGEON STRL SZ 6.5 (GLOVE) ×1 IMPLANT
GLOVE BIO SURGEON STRL SZ7.5 (GLOVE) ×2 IMPLANT
GLOVE BIO SURGEONS STRL SZ 6.5 (GLOVE) ×1
GLOVE BIOGEL PI IND STRL 7.5 (GLOVE) IMPLANT
GLOVE BIOGEL PI IND STRL 8 (GLOVE) ×1 IMPLANT
GLOVE BIOGEL PI INDICATOR 7.5 (GLOVE) ×2
GLOVE BIOGEL PI INDICATOR 8 (GLOVE) ×2
GLOVE ECLIPSE 8.0 STRL XLNG CF (GLOVE) ×3 IMPLANT
GOWN STRL REUS W/ TWL LRG LVL3 (GOWN DISPOSABLE) ×2 IMPLANT
GOWN STRL REUS W/TWL LRG LVL3 (GOWN DISPOSABLE) ×9
HEMOSTAT ARISTA ABSORB 3G PWDR (HEMOSTASIS) IMPLANT
HEMOSTAT SNOW SURGICEL 2X4 (HEMOSTASIS) IMPLANT
KIT MARKER MARGIN INK (KITS) ×3 IMPLANT
NDL HYPO 25X1 1.5 SAFETY (NEEDLE) ×1 IMPLANT
NEEDLE HYPO 25X1 1.5 SAFETY (NEEDLE) ×3 IMPLANT
NS IRRIG 1000ML POUR BTL (IV SOLUTION) ×3 IMPLANT
PACK BASIN DAY SURGERY FS (CUSTOM PROCEDURE TRAY) ×3 IMPLANT
PENCIL SMOKE EVACUATOR (MISCELLANEOUS) ×3 IMPLANT
SLEEVE SCD COMPRESS KNEE MED (MISCELLANEOUS) ×3 IMPLANT
SPONGE LAP 4X18 RFD (DISPOSABLE) ×3 IMPLANT
SUT MNCRL AB 4-0 PS2 18 (SUTURE) ×3 IMPLANT
SUT SILK 2 0 SH (SUTURE) IMPLANT
SUT VICRYL 3-0 CR8 SH (SUTURE) ×3 IMPLANT
SYR CONTROL 10ML LL (SYRINGE) ×3 IMPLANT
TOWEL GREEN STERILE FF (TOWEL DISPOSABLE) ×3 IMPLANT
TRAY FAXITRON CT DISP (TRAY / TRAY PROCEDURE) ×3 IMPLANT
TUBE CONNECTING 20'X1/4 (TUBING)
TUBE CONNECTING 20X1/4 (TUBING) IMPLANT
YANKAUER SUCT BULB TIP NO VENT (SUCTIONS) IMPLANT

## 2019-12-21 NOTE — Anesthesia Postprocedure Evaluation (Signed)
Anesthesia Post Note  Patient: Allison Soto  Procedure(s) Performed: LEFT BREAST LUMPECTOMY WITH RADIOACTIVE SEED LOCALIZATION (Left Breast)     Patient location during evaluation: PACU Anesthesia Type: General Level of consciousness: awake and alert Pain management: pain level controlled Vital Signs Assessment: post-procedure vital signs reviewed and stable Respiratory status: spontaneous breathing, nonlabored ventilation, respiratory function stable and patient connected to nasal cannula oxygen Cardiovascular status: blood pressure returned to baseline and stable Postop Assessment: no apparent nausea or vomiting Anesthetic complications: no   No complications documented.  Last Vitals:  Vitals:   12/21/19 0837 12/21/19 0840  BP: 125/71 126/70  Pulse: 64 65  Resp: 17 16  Temp:  36.7 C  SpO2: 99% 99%    Last Pain:  Vitals:   12/21/19 0840  TempSrc:   PainSc: 0-No pain                 Barnet Glasgow

## 2019-12-21 NOTE — Anesthesia Preprocedure Evaluation (Signed)
Anesthesia Evaluation  Patient identified by MRN, date of birth, ID band Patient awake    Reviewed: Allergy & Precautions, NPO status , Patient's Chart, lab work & pertinent test results  Airway Mallampati: II  TM Distance: >3 FB Neck ROM: Full    Dental no notable dental hx. (+) Teeth Intact, Dental Advisory Given   Pulmonary asthma ,    Pulmonary exam normal breath sounds clear to auscultation       Cardiovascular Exercise Tolerance: Good negative cardio ROS Normal cardiovascular exam Rhythm:Regular Rate:Normal     Neuro/Psych PSYCHIATRIC DISORDERS Anxiety    GI/Hepatic Neg liver ROS, GERD  Medicated and Controlled,  Endo/Other  negative endocrine ROS  Renal/GU negative Renal ROSK+ 5.0 Cr 0.78     Musculoskeletal  (+) Arthritis ,   Abdominal   Peds  Hematology Hgb 15.6 Plt 247   Anesthesia Other Findings   Reproductive/Obstetrics                             Anesthesia Physical Anesthesia Plan  ASA: II  Anesthesia Plan: General   Post-op Pain Management:    Induction: Intravenous  PONV Risk Score and Plan: 4 or greater and Treatment may vary due to age or medical condition, Ondansetron, Dexamethasone and Midazolam  Airway Management Planned: LMA  Additional Equipment: None  Intra-op Plan:   Post-operative Plan:   Informed Consent: I have reviewed the patients History and Physical, chart, labs and discussed the procedure including the risks, benefits and alternatives for the proposed anesthesia with the patient or authorized representative who has indicated his/her understanding and acceptance.     Dental advisory given  Plan Discussed with: CRNA and Anesthesiologist  Anesthesia Plan Comments:         Anesthesia Quick Evaluation

## 2019-12-21 NOTE — Interval H&P Note (Signed)
History and Physical Interval Note:  12/21/2019 7:20 AM  Allison Soto  has presented today for surgery, with the diagnosis of RADICAL SCAR LEFT BREAST.  The various methods of treatment have been discussed with the patient and family. After consideration of risks, benefits and other options for treatment, the patient has consented to  Procedure(s): LEFT BREAST LUMPECTOMY WITH RADIOACTIVE SEED LOCALIZATION (Left) as a surgical intervention.  The patient's history has been reviewed, patient examined, no change in status, stable for surgery.  I have reviewed the patient's chart and labs.  Questions were answered to the patient's satisfaction.     [place]

## 2019-12-21 NOTE — H&P (Signed)
Allison Soto  Location: [facility] Patient #: [id number] DOB: [birth year] Married / Language: English / Race: White Female  History of Present Illness AM) Patient words: Patient sent at the request of the breast [facility] of [facility] Dr. Joneen Caraway to chew elevated lifetime risk of breast cancer and family history of breast cancer. Recent magnetic resonance imaging was done which showed an abnormality in the left breast. Core biopsy showed radial scar with fibrocystic change of microcalcifications. Lifetime risk based on family history and personal history he is over 20%. Patient denies history of breast pain nipple discharge or other issue is her breasts.        Family history of breast cancer with her mother diagnosed at age 23 and 1st cousin diagnosed at age 2. Calculated lifetime risk of developing breast cancer of 22.9%.  LABS: None obtained on site today.  EXAM: BILATERAL BREAST MRI WITH AND WITHOUT CONTRAST  TECHNIQUE: Multiplanar, multisequence MR images of both breasts were obtained prior to and following the intravenous administration of 6 ml of Gadavist  Three-dimensional MR images were rendered by post-processing of the original MR data on an independent workstation. The three-dimensional MR images were interpreted, and findings are reported in the following complete MRI report for this study. Three dimensional images were evaluated at the independent interpreting workstation using the DynaCAD thin client.  COMPARISON: Previous mammograms [facility], the most recent dated 08/08/2019.  FINDINGS: Breast composition: c. Heterogeneous fibroglandular tissue.  Background parenchymal enhancement: Moderate to marked  Right breast: Multiple small cysts. No mass or enhancement suspicious for malignancy.  Left breast: Multiple small cysts. There is also a 7 x 5 mm oval, circumscribed, homogeneously enhancing mass in the upper  inner quadrant of the breast in the middle 3rd on image number 48 series 5. This is mildly lobulated and is in approximately the 11 o'clock position. No mammographic correlate.  Lymph nodes: No abnormal appearing lymph nodes.  Ancillary findings: None.  IMPRESSION: 1. 7 x 5 mm indeterminate oval enhancing mass in approximately the 11 o'clock position of the left breast. 2. Multiple small bilateral breast cysts.  RECOMMENDATION: Focused ultrasound of the upper inner quadrant of the left breast to determine if the 7 x 5 mm mass can be visualized with ultrasound for biopsy purposes. If this cannot be visualized with ultrasound, MR guided core needle biopsy this mass would be recommended.  BI-RADS CATEGORY 4: Suspicious.   Electronically Signed By: Claudie Revering M.D. 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 This was found to be concordant by Dr. Ammie Ferrier, with excision recommended.  Pathology results were discussed with the patient by telephone. The patient reported doing well after the biopsy with minimal tenderness at the site. Post biopsy instructions and care were reviewed and questions were answered. The patient was encouraged to call [facility] for any additional concerns. My direct phone number was provided.  Surgical consultation has been arranged with Dr. Erroll Luna at [facility] on November 10, 2019.  Pathology results were called to Dr. Emmit Pomfret at [facility] in [place], [place] on October 26, 2019.  Pathology results reported by Terie Purser, RN on 10/26/2019.          Diagnosis Breast, left, needle core biopsy, UIQ - FIBROCYSTIC CHANGE WITH USUAL DUCTAL  HYPERPLASIA AND CALCIFICATIONS. SEE NOTE - COMPLEX SCLEROSING LESION - NEGATIVE FOR CARCINOMA.  The patient is a 59 year old female.   Past Surgical History Kallen Delatorre, [place]; 11/10/2019 9:24 AM) Cesarean Section - Multiple  Diagnostic Studies History Dove G[place]am, [place]; 11/10/2019 9:24 AM) Colonoscopy 1-5 years ago Mammogram within last year Pap Smear 1-5 years ago  Allergies Emeline Gins, Penicillins SHELLFISH Allergies Reconciled  Medication History Judie Hollick, CMA; 11/10/2019 9:27 AM) Estradiol (1MG  Tablet, Oral) Active. Vitamin D (1000UNIT Tablet, Oral) Active. CoQ10 (200MG  Capsule, Oral) Active. Dexilant (60MG  Capsule DR, Oral) Active. Crestor (5MG  Tablet, Oral) Active. fluvoxaMINE Maleate (100MG  Tablet, Oral) Active. traZODone HCl (50MG  Tablet, Oral) Active. Symbicort (160-4.5MCG/ACT Aerosol, Inhalation) Active. Medications Reconciled  Social History Sherre Wooton, [place]; 11/10/2019 9:24 AM) Alcohol use Occasional alcohol use. Caffeine use Tea. No drug use Tobacco use Never smoker.  Family History Nyeli [facility]meyer, Breast Cancer Mother. Cerebrovascular Accident Father. Depression Mother. Hypertension Father, Mother. Thyroid problems Mother.  Pregnancy / Birth History Che[place]ha Behlke, CMA Age at menarche 84 years. Age of menopause [age] Contraceptive History Intrauterine device. Gravida 2 Irregular periods Length (months) of breastfeeding 7-12 Maternal age 15-35 Para 2  Other Problems Ashlynne Shetterly, Asthma Depression Gastroesophageal Reflux Disease Hypercholesterolemia Lump In Breast     Review of Systems Emeline Gins CMA;  General Not Present- Appetite Loss, Chills, Fatigue, Fever, Night Sweats, Weight Gain and Weight Loss. Skin Not Present- Change in Wart/Mole, Dryness, Hives, Jaundice, New Lesions, Non-Healing Wounds, Rash and Ulcer. HEENT Present- Wears glasses/contact  lenses. Not Present- Earache, Hearing Loss, Hoarseness, Nose Bleed, Oral Ulcers, Ringing in the Ears, Seasonal Allergies, Sinus Pain, Sore Throat, Visual Disturbances and Yellow Eyes. Respiratory Present- Snoring. Not Present- Bloody sputum, Chronic Cough, Difficulty Breathing and Wheezing. Breast Present- Breast Mass. Not Present- Breast Pain, Nipple Discharge and Skin Changes. Cardiovascular Not Present- Chest Pain, Difficulty Breathing Lying Down, Leg Cramps, Palpitations, Rapid Heart Rate, Shortness of Breath and Swelling of Extremities. Gastrointestinal Not Present- Abdominal Pain, Bloating, Bloody Stool, Change in Bowel Habits, Chronic diarrhea, Constipation, Difficulty Swallowing, Excessive gas, Gets full quickly at meals, Hemorrhoids, Indigestion, Nausea, Rectal Pain and Vomiting. Female Genitourinary Not Present- Frequency, Nocturia, Painful Urination, Pelvic Pain and Urgency. Musculoskeletal Not Present- Back Pain, Joint Pain, Joint Stiffness, Muscle Pain, Muscle Weakness and Swelling of Extremities. Neurological Not Present- Decreased Memory, Fainting, Headaches, Numbness, Seizures, Tingling, Tremor, Trouble walking and Weakness. Psychiatric Not Present- Anxiety, Bipolar, Change in Sleep Pattern, Depression, Fearful and Frequent crying. Endocrine Not Present- Cold Intolerance, Excessive Hunger, Hair Changes, Heat Intolerance, Hot flashes and New Diabetes. Hematology Not Present- Blood Thinners, Easy Bruising, Excessive bleeding, Gland problems, HIV and Persistent Infections.  Vitals Aara Jacquot CMA; 11/10/2019 9:25 AM) 11/10/2019 9:25 AM Weight: 145.4 lb Height: 65in Body Surface Area: 1.73 m Body Mass Index: 24.2 kg/m  Temp.: 98.44F  Pulse: 82 (Regular)  BP: 124/80(Sitting, Left Arm, Standard)        Physical Exam (Nylia Gavina A. Terika Pillard MD;   General Mental Status-Alert. General Appearance-Consistent with stated age. Hydration-Well  hydrated. Voice-Normal.  Head and Neck Head-normocephalic, atraumatic with no lesions or palpable masses. Trachea-midline. Thyroid Gland Characteristics - normal size and consistency.  Eye Eyeball - Bilateral-Extraocular movements intact. Sclera/Conjunctiva - Bilateral-No scleral icterus.  Chest and Lung Exam Chest and lung exam reveals -quiet, even and easy respiratory effort with no use of accessory muscles and on auscultation, normal breath sounds, no adventitious sounds and normal vocal resonance. Inspection Chest Wall - Normal. Back - normal.  Breast  Breast - Left-Symmetric, Non Tender, No Biopsy scars, no Dimpling - Left, No Inflammation, No Lumpectomy scars, No Mastectomy scars, No Peau d' Orange. Breast - Right-Symmetric, Non Tender, No Biopsy scars, no Dimpling - Right, No Inflammation, No Lumpectomy scars, No Mastectomy scars, No Peau d' Orange. Breast Lump-No Palpable Breast Mass.  Cardiovascular Cardiovascular examination reveals -normal heart sounds, regular rate and rhythm with no murmurs and normal pedal pulses bilaterally.  Abdomen Inspection Inspection of the abdomen reveals - No Hernias. Skin - Scar - no surgical scars. Palpation/Percussion Palpation and Percussion of the abdomen reveal - Soft, Non Tender, No Rebound tenderness, No Rigidity (guarding) and No hepatosplenomegaly. Auscultation Auscultation of the abdomen reveals - Bowel sounds normal.  Neurologic Neurologic evaluation reveals -alert and oriented x 3 with no impairment of recent or remote memory. Mental Status-Normal.  Musculoskeletal Normal Exam - Left-Upper Extremity Strength Normal and Lower Extremity Strength Normal. Normal Exam - Right-Upper Extremity Strength Normal and Lower Extremity Strength Normal.  Lymphatic Head & Neck  General Head & Neck Lymphatics: Bilateral - Description - Normal. Axillary  General Axillary Region: Bilateral -  Description - Normal. Tenderness - Non Tender. Femoral & Inguinal  Generalized Femoral & Inguinal Lymphatics: Bilateral - Description - Normal. Tenderness - Non Tender.    Assessment & Plan  RADIAL SCAR OF LEFT BREAST (N64.89) Impression: Discussed the relevance of this and recommend seed localized left breast lumpectomy to exclude malignancy and potential upgrade risk of up to 20%. She also has a strong family history and lifetime risk of 20%. Risk of lumpectomy include bleeding, infection, seroma, more surgery, use of seed/wire, wound care, cosmetic deformity and the need for other treatments, death , blood clots, death. Pt agrees to proceed.  Current Plans Pt Education - [facility] Breast Biopsy HCI: discussed with patient and provided information. You are being scheduled for surgery- Our schedulers will call you.  You should hear from our office's scheduling department within 5 working days about the location, date, and time of surgery. We try to make accommodations for patient's preferences in scheduling surgery, but sometimes the OR schedule or the surgeon's schedule prevents [place] from making those accommodations.  If you have not heard from our office [phone]) in 5 working days, call the office and ask for your surgeon's nurse.  If you have other questions about your diagnosis, plan, or surgery, call the office and ask for your surgeon's nurse.   AT HIGH RISK FOR BREAST CANCER (Z91.89) Impression: Discussed risk reducing measures such as tamoxifen/walks with an and increased imaging and/or magnetic resonance imaging imaging yearly. Discussed risk reducing surgery the pros and cons of all the above. Discussed dietary modifications, activity modifications and other things to reduce risk. Now she will proceed with seed localized left breast lumpectomy and consider her options. Risk of lumpectomy include bleeding, infection, seroma, more surgery, use of seed/wire, wound care,  cosmetic deformity and the need for other treatments, death , blood clots, death. Pt agrees to proceed.

## 2019-12-21 NOTE — Transfer of Care (Signed)
Immediate Anesthesia Transfer of Care Note  Patient: [name]  Procedure(s) Performed: LEFT BREAST LUMPECTOMY WITH RADIOACTIVE SEED LOCALIZATION (Left Breast)  Patient Location: PACU  Anesthesia Type:General  Level of Consciousness: drowsy, patient cooperative and responds to stimulation  Airway & Oxygen Therapy: Patient Spontanous Breathing and Patient connected to face mask oxygen  Post-op Assessment: Report given to RN and Post -op Vital signs reviewed and stable  Post vital signs: Reviewed and stable  Last Vitals:  Vitals Value Taken Time  BP    Temp    Pulse 74 12/21/19 0814  Resp 11 12/21/19 0814  SpO2 100 % 12/21/19 0814  Vitals shown include unvalidated device data.  Last Pain:  Vitals:   12/21/19 0628  TempSrc: Oral  PainSc: 0-No pain         Complications: No complications documented.

## 2019-12-21 NOTE — Discharge Instructions (Signed)
°Post Anesthesia Home Care Instructions ° °Activity: °Get plenty of rest for the remainder of the day. A responsible individual must stay with you for 24 hours following the procedure.  °For the next 24 hours, DO NOT: °-Drive a car °-Operate machinery °-Drink alcoholic beverages °-Take any medication unless instructed by your physician °-Make any legal decisions or sign important papers. ° °Meals: °Start with liquid foods such as gelatin or soup. Progress to regular foods as tolerated. Avoid greasy, spicy, heavy foods. If nausea and/or vomiting occur, drink only clear liquids until the nausea and/or vomiting subsides. Call your physician if vomiting continues. ° °Special Instructions/Symptoms: °Your throat may feel dry or sore from the anesthesia or the breathing tube placed in your throat during surgery. If this causes discomfort, gargle with warm salt water. The discomfort should disappear within 24 hours. ° °If you had a scopolamine patch placed behind your ear for the management of post- operative nausea and/or vomiting: ° °1. The medication in the patch is effective for 72 hours, after which it should be removed.  Wrap patch in a tissue and discard in the trash. Wash hands thoroughly with soap and water. °2. You may remove the patch earlier than 72 hours if you experience unpleasant side effects which may include dry mouth, dizziness or visual disturbances. °3. Avoid touching the patch. Wash your hands with soap and water after contact with the patch. °  ° ° ° ° °[facility] °Office Phone Number [phone] ° °BREAST BIOPSY/ PARTIAL MASTECTOMY: POST OP INSTRUCTIONS ° °Always review your discharge instruction sheet given to you by the facility where your surgery was performed. ° °IF YOU HAVE DISABILITY OR FAMILY LEAVE FORMS, YOU MUST BRING THEM TO THE OFFICE FOR PROCESSING.  DO NOT GIVE THEM TO YOUR DOCTOR. ° °1. A prescription for pain medication may be given to you upon discharge.  Take your  pain medication as prescribed, if needed.  If narcotic pain medicine is not needed, then you may take acetaminophen (Tylenol) or ibuprofen (Advil) as needed. °2. Take your usually prescribed medications unless otherwise directed °3. If you need a refill on your pain medication, please contact your pharmacy.  They will contact our office to request authorization.  Prescriptions will not be filled after 5pm or on week-ends. °4. You should eat very light the first 24 hours after surgery, such as soup, crackers, pudding, etc.  Resume your normal diet the day after surgery. °5. Most patients will experience some swelling and bruising in the breast.  Ice packs and a good support bra will help.  Swelling and bruising can take several days to resolve.  °6. It is common to experience some constipation if taking pain medication after surgery.  Increasing fluid intake and taking a stool softener will usually help or prevent this problem from occurring.  A mild laxative (Milk of Magnesia or Miralax) should be taken according to package directions if there are no bowel movements after 48 hours. °7. Unless discharge instructions indicate otherwise, you may remove your bandages 24-48 hours after surgery, and you may shower at that time.  You may have steri-strips (small skin tapes) in place directly over the incision.  These strips should be left on the skin for 7-10 days.  If your surgeon used skin glue on the incision, you may shower in 24 hours.  The glue will flake off over the next 2-3 weeks.  Any sutures or staples will be removed at the office during your follow-up visit. °  8. ACTIVITIES:  You may resume regular daily activities (gradually increasing) beginning the next day.  Wearing a good support bra or sports bra minimizes pain and swelling.  You may have sexual intercourse when it is comfortable. °a. You may drive when you no longer are taking prescription pain medication, you can comfortably wear a seatbelt, and you can  safely maneuver your car and apply brakes. °b. RETURN TO WORK:  ______________________________________________________________________________________ °9. You should see your doctor in the office for a follow-up appointment approximately two weeks after your surgery.  Your doctor’s nurse will typically make your follow-up appointment when she calls you with your pathology report.  Expect your pathology report 2-3 business days after your surgery.  You may call to check if you do not hear from us after three days. °10. OTHER INSTRUCTIONS: _______________________________________________________________________________________________ _____________________________________________________________________________________________________________________________________ °_____________________________________________________________________________________________________________________________________ °_____________________________________________________________________________________________________________________________________ ° °WHEN TO CALL YOUR DOCTOR: °1. Fever over 101.0 °2. Nausea and/or vomiting. °3. Extreme swelling or bruising. °4. Continued bleeding from incision. °5. Increased pain, redness, or drainage from the incision. ° °The clinic staff is available to answer your questions during regular business hours.  Please don’t hesitate to call and ask to speak to one of the nurses for clinical concerns.  If you have a medical emergency, go to the nearest emergency room or call 911.  A surgeon from [facility] is always on call at the hospital. ° °For further questions, please visit [email or web address]  °

## 2019-12-21 NOTE — Anesthesia Procedure Notes (Signed)
Procedure Name: LMA Insertion Date/Time: 12/21/2019 7:34 AM Performed by: Niel Hummer, CRNA Pre-anesthesia Checklist: Patient identified, Emergency Drugs available, Suction available and Patient being monitored Patient Re-evaluated:Patient Re-evaluated prior to induction Oxygen Delivery Method: Circle system utilized Preoxygenation: Pre-oxygenation with 100% oxygen Induction Type: IV induction LMA: LMA inserted LMA Size: 4.0 Number of attempts: 1 Dental Injury: Teeth and Oropharynx as per pre-operative assessment

## 2019-12-21 NOTE — Op Note (Signed)
Preoperative diagnosis: Left breast complex sclerosing lesion  Postoperative diagnosis: Same  Procedure: Left breast seed localized lumpectomy  Surgeon: Erroll Luna, MD  Anesthesia: LMA with 0.25% Marcaine  Drains: None  Specimen: Left breast tissue with seed and clip verified by Faxitron  IV fluids: Per anesthesia record  Indications for procedure: The patient presents for left breast lumpectomy due to an abnormal mammogram.  She was found to have an area of density left breast upper outer quadrant.  Core biopsy showed findings worrisome for a complex sclerosing lesion.  We discussed the findings and the potential upgrade risk of these lesions found on core biopsy.  Risk, benefits and observation discussed as an option for above.  She opted for left breast lumpectomy due to potential upgrade risk of 10 to 20%.The procedure has been discussed with the patient. Alternatives to surgery have been discussed with the patient.  Risks of surgery include bleeding,  Infection,  Seroma formation, death,  and the need for further surgery.   The patient understands and wishes to proceed.   Description of procedure: The patient was met in the holding area and questions were answered.  Neoprobe used to verify seed location left breast upper outer quadrant.  All questions were answered.  Films were available for review.  The patient was taken to the operating room.  She was placed supine upon the OR table.  After induction of general esthesia, left breast was prepped and draped in sterile fashion timeout performed.  The patient received appropriate preoperative antibiotics.  She is placed in reverse Trendelenburg.  Neoprobe used to identify the seed in the upper outer quadrant.  Transverse incision was made after infiltration of the skin with local anesthetic.  Dissection was carried down all tissue and the seed and clip were excised with a grossly negative margin.  The Faxitron image revealed seed and clip  to be present in the specimen.  The cavity was examined and found to be hemostatic.  Irrigation was used and local anesthetic infiltrated.  Wound closed with 3-0 Vicryl and 4-0 Monocryl.  Dermabond applied.  All counts were found to be correct.  Breast binder placed.  Patient was awoke extubated taken to recovery in satisfactory condition.

## 2019-12-22 ENCOUNTER — Encounter (HOSPITAL_BASED_OUTPATIENT_CLINIC_OR_DEPARTMENT_OTHER): Payer: Self-pay | Admitting: Surgery

## 2019-12-25 ENCOUNTER — Encounter: Payer: Self-pay | Admitting: Obstetrics & Gynecology

## 2019-12-25 LAB — SURGICAL PATHOLOGY

## 2020-01-01 ENCOUNTER — Telehealth: Payer: Self-pay

## 2020-01-01 NOTE — Telephone Encounter (Signed)
Patient has started spotting.

## 2020-01-01 NOTE — Telephone Encounter (Signed)
AEX 08/2019 H/o AUB 2019, Neg EMB 02/2017 Dx radial scar 10/25/19, had left lumpectomy on 12/21/19. Mirena IUD removal 11/16/19 by SM Off HRT 12/04/19  Spoke with pt. Pt states having vaginal spotting x 3 days on 12/25/19 only when wiping. Denies heavy bleeding, clots, abd pain or cramps. Pt did not wear panty liner or pad. Only seen light pink spotting when wiping after voiding. Pt states happened last month around same time (10/15) after IUD removal.   Pt advised to have OV for further evaluation. Pt agreeable. Pt scheduled with first available provider. Saw Dr Sabra Heck last. Pt has OV with Dr Talbert Nan on 11/24 at 1030 am. Pt agreeable to date and time of appt.  Routing to Dr Talbert Nan for review.  Encounter closed

## 2020-01-03 ENCOUNTER — Ambulatory Visit (INDEPENDENT_AMBULATORY_CARE_PROVIDER_SITE_OTHER): Payer: No Typology Code available for payment source | Admitting: Obstetrics and Gynecology

## 2020-01-03 ENCOUNTER — Encounter: Payer: Self-pay | Admitting: Obstetrics and Gynecology

## 2020-01-03 ENCOUNTER — Other Ambulatory Visit: Payer: Self-pay

## 2020-01-03 VITALS — BP 122/60 | HR 66 | Ht 64.0 in | Wt 144.4 lb

## 2020-01-03 DIAGNOSIS — N950 Postmenopausal bleeding: Secondary | ICD-10-CM

## 2020-01-03 NOTE — Progress Notes (Signed)
GYNECOLOGY  VISIT   HPI: 59 y.o.   Married White or Caucasian Not Hispanic or Latino  female   [phone] with Patient's last menstrual period was 01/23/2017.   here for  Vaginal bleeding x 3 day. She denies heavy bleeding and clots. She says that the spotting seems to be a month apart. Its a light pink when she wipes.   The patient underwent a left breast lumpectomy earlier this month for a radial scar, no malignancy was identified.  She had been on estradiol with a mirena IUD for endometrial protection. She went off of the estradiol and had her IUD removed last month secondary to the radial scar and her risk for breast cancer.  She still had a premenopausal [place] in 12/18.  Since stopping HRT she is waking up early in the am and is up for 1.5 hours. Overall she is doing okay.   She had the IUD removed on 11/16/19, the next week she had very light spotting for a week, lasted for a week and then stopped. Then one month later she had very light spotting for 3-4 days (last week). Not sexually active prior to the bleeding.   Normal pap in 7/21.    GYNECOLOGIC HISTORY: Patient's last menstrual period was 01/23/2017. Contraception: PMP Menopausal hormone therapy:  None         OB History    Gravida  2   Para  2   Term  2   Preterm  0   AB  0   Living  2     SAB  0   TAB  0   Ectopic  0   Multiple  0   Live Births  2              Patient Active Problem List   Diagnosis Date Noted  . 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 Body aches 05/22/2015  . Fibroids, intramural 02/06/2015  . Other malaise and fatigue 09/27/2013  . Elevated glucose 09/27/2013    Past Medical History:  Diagnosis Date  . Allergy   . Anemia   . Anxiety   . Arthritis   . Asthma   . Depression   . GERD (gastroesophageal reflux disease)   . Hyperlipidemia   . Pneumonia 1987  . Pre-diabetes   . Radial scar of breast   . Reflux   . Single cyst of left breast 02/01/12   Diagnostic mammogram and  ultrasound - cyst at 2:30    Past Surgical History:  Procedure Laterality Date  . BREAST LUMPECTOMY WITH RADIOACTIVE SEED LOCALIZATION Left 12/21/2019   Procedure: LEFT BREAST LUMPECTOMY WITH RADIOACTIVE SEED LOCALIZATION;  Surgeon: Erroll Luna, MD;  Location: [facility];  Service: General;  Laterality: Left;  . [place]  . COLONOSCOPY    . INTRAUTERINE DEVICE (IUD) INSERTION  08/19/2015   Mirena  . POLYPECTOMY    . UPPER GASTROINTESTINAL ENDOSCOPY    . WISDOM TOOTH EXTRACTION      Current Outpatient Medications  Medication Sig Dispense Refill  . albuterol (PROAIR HFA) 108 (90 Base) MCG/ACT inhaler Inhale 2 puffs into the lungs every 4 (four) hours as needed for wheezing or shortness of breath. 1 Inhaler 3  . budesonide-formoterol (SYMBICORT) 160-4.5 MCG/ACT inhaler Inhale 2 puffs into the lungs 2 (two) times daily. Reported on 05/21/2015 1 Inhaler 11  . Cholecalciferol (VITAMIN D-3) 1000 units CAPS Take 1 capsule by mouth daily.    . Coenzyme  Q10 (CO Q 10 PO) Take by mouth.    . CRESTOR 5 MG tablet Take 5 mg by mouth every evening.  12  . dexlansoprazole (DEXILANT) 60 MG capsule Take one capsule every morning 30 capsule 3  . EPINEPHrine (EPIPEN 2-PAK) 0.3 mg/0.3 mL IJ SOAJ injection Inject 0.3 mLs (0.3 mg total) into the muscle once. 2 Device 2  . fluvoxaMINE (LUVOX) 100 MG tablet Take 100 mg by mouth at bedtime. Taking 150mg  daily    . traZODone (DESYREL) 50 MG tablet Take 50 mg by mouth at bedtime.      Current Facility-Administered Medications  Medication Dose Route Frequency Provider Last Rate Last Admin  . 0.9 %  sodium chloride infusion  500 mL Intravenous Once Jackquline Denmark, MD         ALLERGIES: Shellfish allergy and Penicillins  Family History  Problem Relation Age of Onset  . Breast cancer Mother 75  . Depression Mother   . Anxiety disorder Mother   . Osteoporosis Mother   . Thyroid disease Mother        on synthroid  .  Depression Sister   . Anxiety disorder Sister   . Stroke Father        died 03/10/12  . Dementia Father   . Anxiety disorder Brother   . Depression Brother   . Dystonia Brother        cervical  . Colon cancer Neg Hx   . Colon polyps Neg Hx   . Esophageal cancer Neg Hx   . Rectal cancer Neg Hx   . Stomach cancer Neg Hx     Social History   Socioeconomic History  . Marital status: Married    Spouse name: Not on file  . Number of children: Not on file  . Years of education: Not on file  . Highest education level: Not on file  Occupational History  . Not on file  Tobacco Use  . Smoking status: Never Smoker  . Smokeless tobacco: Never Used  Vaping Use  . Vaping Use: Never used  Substance and Sexual Activity  . Alcohol use: Yes    Alcohol/week: 0.0 - 1.0 standard drinks  . Drug use: No  . Sexual activity: Yes    Partners: Male    Comment: Mirena Inserted 08/19/15  Other Topics Concern  . Not on file  Social History Narrative  . Not on file   Social Determinants of Health   Financial Resource Strain:   . Difficulty of Paying Living Expenses: Not on file  Food Insecurity:   . Worried About Charity fundraiser in the Last Year: Not on file  . Ran Out of Food in the Last Year: Not on file  Transportation Needs:   . Lack of Transportation (Medical): Not on file  . Lack of Transportation (Non-Medical): Not on file  Physical Activity:   . Days of Exercise per Week: Not on file  . Minutes of Exercise per Session: Not on file  Stress:   . Feeling of Stress : Not on file  Social Connections:   . Frequency of Communication with Friends and Family: Not on file  . Frequency of Social Gatherings with Friends and Family: Not on file  . Attends Religious Services: Not on file  . Active Member of Clubs or Organizations: Not on file  . Attends Archivist Meetings: Not on file  . Marital Status: Not on file  Intimate Partner Violence:   . Fear of  Current or  Ex-Partner: Not on file  . Emotionally Abused: Not on file  . Physically Abused: Not on file  . Sexually Abused: Not on file    Review of Systems  All other systems reviewed and are negative.   PHYSICAL EXAMINATION:    BP 122/60   Pulse 66   Ht 5\' 4"  (1.626 m)   Wt 144 lb 6.4 oz (65.5 kg)   LMP 01/23/2017   SpO2 99%   BMI 24.79 kg/m     General appearance: alert, cooperative and appears stated age Neck: no adenopathy, supple, symmetrical, trachea midline and thyroid normal to inspection and palpation Abdomen: soft, non-tender; non distended, no masses,  no organomegaly  Pelvic: External genitalia:  no lesions              Urethra:  normal appearing urethra with no masses, tenderness or lesions              Bartholins and Skenes: normal                 Vagina: normal appearing vagina with normal color and discharge, no lesions              Cervix: no lesions and not friable              Bimanual Exam:  Uterus:  normal size, contour, position, consistency, mobility, non-tender and anteverted              Adnexa: no mass, fullness, tenderness              Chaperone was present for exam.  ASSESSMENT PMP bleeding, recently off of HRT. Normal exam, normal pap earlier this year Elevated risk of breast cancer, is considering starting Tamoxifen    PLAN [place] Return for pelvic ultrasound Briefly discussed tamoxifen

## 2020-01-04 LAB — FOLLICLE STIMULATING HORMONE: FSH: 72.3 m[IU]/mL

## 2020-01-08 ENCOUNTER — Telehealth: Payer: Self-pay

## 2020-01-08 NOTE — Telephone Encounter (Signed)
Spoke with patient regarding benefits for recommended ultrasound. Patient is aware that ultrasound is transvaginal. Patient acknowledges understanding of information presented. Patient is aware of cancellation policy. Patient scheduled appointment for 01/16/2020 at 0230PM with Sumner Boast, MD. Encounter closed.

## 2020-01-16 ENCOUNTER — Encounter: Payer: Self-pay | Admitting: Obstetrics and Gynecology

## 2020-01-16 ENCOUNTER — Ambulatory Visit (INDEPENDENT_AMBULATORY_CARE_PROVIDER_SITE_OTHER): Payer: No Typology Code available for payment source | Admitting: Obstetrics and Gynecology

## 2020-01-16 ENCOUNTER — Ambulatory Visit (INDEPENDENT_AMBULATORY_CARE_PROVIDER_SITE_OTHER): Payer: No Typology Code available for payment source

## 2020-01-16 ENCOUNTER — Other Ambulatory Visit: Payer: Self-pay | Admitting: Obstetrics and Gynecology

## 2020-01-16 ENCOUNTER — Other Ambulatory Visit: Payer: Self-pay

## 2020-01-16 VITALS — BP 122/68 | HR 98 | Ht 64.0 in | Wt 144.0 lb

## 2020-01-16 DIAGNOSIS — N950 Postmenopausal bleeding: Secondary | ICD-10-CM

## 2020-01-16 NOTE — Progress Notes (Signed)
GYNECOLOGY  VISIT   HPI: 59 y.o.   Married White or Caucasian Not Hispanic or Latino  female   [phone] with Patient's last menstrual period was 01/23/2017.   here for  Ultrasound consult for PMP bleeding, recently off of HRT. Normal exam, normal pap earlier this year. [place] from 01/03/20 was 72.3.  No bleeding in the last 3 weeks.   GYNECOLOGIC HISTORY: Patient's last menstrual period was 01/23/2017. Contraception: none  Menopausal hormone therapy: none         OB History    Gravida  2   Para  2   Term  2   Preterm  0   AB  0   Living  2     SAB  0   TAB  0   Ectopic  0   Multiple  0   Live Births  2              Patient Active Problem List   Diagnosis Date Noted  . 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 Body aches 05/22/2015  . Fibroids, intramural 02/06/2015  . Other malaise and fatigue 09/27/2013  . Elevated glucose 09/27/2013    Past Medical History:  Diagnosis Date  . Allergy   . Anemia   . Anxiety   . Arthritis   . Asthma   . Depression   . GERD (gastroesophageal reflux disease)   . Hyperlipidemia   . Pneumonia 1987  . Pre-diabetes   . Radial scar of breast   . Reflux   . Single cyst of left breast 02/01/12   Diagnostic mammogram and ultrasound - cyst at 2:30    Past Surgical History:  Procedure Laterality Date  . BREAST LUMPECTOMY WITH RADIOACTIVE SEED LOCALIZATION Left 12/21/2019   Procedure: LEFT BREAST LUMPECTOMY WITH RADIOACTIVE SEED LOCALIZATION;  Surgeon: Erroll Luna, MD;  Location: [facility];  Service: General;  Laterality: Left;  . [place]  . COLONOSCOPY    . INTRAUTERINE DEVICE (IUD) INSERTION  08/19/2015   Mirena  . POLYPECTOMY    . UPPER GASTROINTESTINAL ENDOSCOPY    . WISDOM TOOTH EXTRACTION      Current Outpatient Medications  Medication Sig Dispense Refill  . albuterol (PROAIR HFA) 108 (90 Base) MCG/ACT inhaler Inhale 2 puffs into the lungs every 4 (four) hours as needed for  wheezing or shortness of breath. 1 Inhaler 3  . budesonide-formoterol (SYMBICORT) 160-4.5 MCG/ACT inhaler Inhale 2 puffs into the lungs 2 (two) times daily. Reported on 05/21/2015 1 Inhaler 11  . Cholecalciferol (VITAMIN D-3) 1000 units CAPS Take 1 capsule by mouth daily.    . Coenzyme Q10 (CO Q 10 PO) Take by mouth.    . CRESTOR 5 MG tablet Take 5 mg by mouth every evening.  12  . dexlansoprazole (DEXILANT) 60 MG capsule Take one capsule every morning 30 capsule 3  . EPINEPHrine (EPIPEN 2-PAK) 0.3 mg/0.3 mL IJ SOAJ injection Inject 0.3 mLs (0.3 mg total) into the muscle once. 2 Device 2  . fluvoxaMINE (LUVOX) 100 MG tablet Take 100 mg by mouth at bedtime. Taking 150mg  daily    . traZODone (DESYREL) 50 MG tablet Take 50 mg by mouth at bedtime.      Current Facility-Administered Medications  Medication Dose Route Frequency Provider Last Rate Last Admin  . 0.9 %  sodium chloride infusion  500 mL Intravenous Once Jackquline Denmark, MD         ALLERGIES: Shellfish allergy and Penicillins  Family History  Problem Relation Age of Onset  . Breast cancer Mother 69  . Depression Mother   . Anxiety disorder Mother   . Osteoporosis Mother   . Thyroid disease Mother        on synthroid  . Depression Sister   . Anxiety disorder Sister   . Stroke Father        died 03-12-2012  . Dementia Father   . Anxiety disorder Brother   . Depression Brother   . Dystonia Brother        cervical  . Colon cancer Neg Hx   . Colon polyps Neg Hx   . Esophageal cancer Neg Hx   . Rectal cancer Neg Hx   . Stomach cancer Neg Hx     Social History   Socioeconomic History  . Marital status: Married    Spouse name: Not on file  . Number of children: Not on file  . Years of education: Not on file  . Highest education level: Not on file  Occupational History  . Not on file  Tobacco Use  . Smoking status: Never Smoker  . Smokeless tobacco: Never Used  Vaping Use  . Vaping Use: Never used  Substance and Sexual  Activity  . Alcohol use: Yes    Alcohol/week: 0.0 - 1.0 standard drinks  . Drug use: No  . Sexual activity: Yes    Partners: Male    Comment: Mirena Inserted 08/19/15  Other Topics Concern  . Not on file  Social History Narrative  . Not on file   Social Determinants of Health   Financial Resource Strain:   . Difficulty of Paying Living Expenses: Not on file  Food Insecurity:   . Worried About Charity fundraiser in the Last Year: Not on file  . Ran Out of Food in the Last Year: Not on file  Transportation Needs:   . Lack of Transportation (Medical): Not on file  . Lack of Transportation (Non-Medical): Not on file  Physical Activity:   . Days of Exercise per Week: Not on file  . Minutes of Exercise per Session: Not on file  Stress:   . Feeling of Stress : Not on file  Social Connections:   . Frequency of Communication with Friends and Family: Not on file  . Frequency of Social Gatherings with Friends and Family: Not on file  . Attends Religious Services: Not on file  . Active Member of Clubs or Organizations: Not on file  . Attends Archivist Meetings: Not on file  . Marital Status: Not on file  Intimate Partner Violence:   . Fear of Current or Ex-Partner: Not on file  . Emotionally Abused: Not on file  . Physically Abused: Not on file  . Sexually Abused: Not on file    Review of Systems  All other systems reviewed and are negative.   PHYSICAL EXAMINATION:    BP 122/68   Pulse 98   Ht 5\' 4"  (1.626 m)   Wt 144 lb (65.3 kg)   LMP 01/23/2017   SpO2 (!) 75%   BMI 24.72 kg/m     General appearance: alert, cooperative and appears stated age  Ultrasound images reviewed with the patient. Imaging is slightly limited given her uterine position, myomas and nabothian cysts. Thin endometrial stripe, few small myomas. No concerning findings.  ASSESSMENT PMP shortly after stopping HRT, thin stripe on ultrasound Elevated risk of breast cancer    PLAN She will  call with any further bleeding. Would recommend sonohysterogram and endometrial biopsy She is f/u with Oncology next week to discuss Tamoxifen. I reassured her there are no concerning findings on ultrasound, I'm fine with her starting tamoxifen.

## 2020-01-30 ENCOUNTER — Encounter: Payer: Self-pay | Admitting: Obstetrics and Gynecology

## 2020-01-30 ENCOUNTER — Telehealth: Payer: Self-pay

## 2020-01-30 NOTE — Telephone Encounter (Signed)
Pt sent the following mychart message:   Allison Soto, [name]  P [facility] Dr Talbert Nan on my recent return visit to Dr Brantley Stage after my lumpectomy he and I discussed the possibility of my going back on HRT. He saw no reason I couldnt because in his words there was no atypical hyperplasia in the lumpectomy. He even questioned in his notes if the original biopsy had been correct.  That being said I am continuing to have hot flashes and hair loss. His only suggestion was for me to be on the lowest dose to relieve my symptoms.  Would you be willing to restart me? I have estradiol 1mg  currently but since we removed my Mirena I would need progesterone. I know its the holidays and would be available for a video appointment if we need to discuss this live.   Thanks  [employer]

## 2020-01-30 NOTE — Telephone Encounter (Signed)
Routing to Dr Jertson, please advise  

## 2020-01-31 ENCOUNTER — Encounter: Payer: Self-pay | Admitting: Obstetrics and Gynecology

## 2020-01-31 NOTE — Telephone Encounter (Signed)
Spoke with pt. Pt given update and recommendations per Dr Talbert Nan. Pt states had radial scar left breast bx done on 12/21/19 by Dr Brantley Stage and was advised since no atypical cells seen, ok to resume HRT. Pt states never went to Oncologist due to bx being benign. Pt had follow up with Dr Brantley Stage on 01/19/20. Notes not found in Epic. Pt to send mychart message with information and report.    Pt given options per Dr Talbert Nan. Pt agreeable, but would like to discuss further with mychart visit.  Pt scheduled for 12/28 at 1130 am with Dr Talbert Nan. Pt verbalized understanding to date and time of appt.  Routing to Dr Talbert Nan Encounter closed

## 2020-01-31 NOTE — Telephone Encounter (Signed)
At our last visit the patient thought that she was going to be started on Tamoxifen? I assume this plan has changed. Please get a copy of the Oncology visit.  If she wants to restart on HRT we should probably have a video visit, but I realize time is limited with the holiday schedule. The least risk would be with transdermal estrogen and prometrium. See if she is open to that idea. I could start her on the lowest dose and we could f/u in one month. What she currently has, 1 mg of oral estradiol, is not the lowest dose.

## 2020-02-05 ENCOUNTER — Telehealth: Payer: No Typology Code available for payment source | Admitting: Obstetrics and Gynecology

## 2020-02-05 ENCOUNTER — Other Ambulatory Visit: Payer: Self-pay

## 2020-02-05 NOTE — Progress Notes (Deleted)
Virtual Visit via Video Note  I connected with Allison Soto on 02/05/20 at 11:30 AM EST by a video enabled telemedicine application and verified that I am speaking with the correct person using two identifiers.  Location: Patient: *** Provider: [facility]    I discussed the limitations of evaluation and management by telemedicine and the availability of in person appointments. The patient expressed understanding and agreed to proceed.  GYNECOLOGY  VISIT   HPI: 59 y.o.   Married White or Caucasian Not Hispanic or Latino  female   [phone] with Patient's last menstrual period was 01/23/2017.   here for a virtual visit to discuss restarting HRT.   She went off of HRT in 10/21 secondary to having an abnormal mammogram. She ended up having a lumpectomy with diagnosis of radial scar with usual ductal hyperplasia, no malignancy.   She recently saw her Surgeon who felt her risk of breast cancer was between 12-20%, they discussed yearly abbreviated breast MRI's.   In 10/21 she stopped her oral estrogen and her mirena IUD was removed.   GYNECOLOGIC HISTORY: Patient's last menstrual period was 01/23/2017. Contraception:PMP Menopausal hormone therapy: none        OB History    Gravida  2   Para  2   Term  2   Preterm  0   AB  0   Living  2     SAB  0   IAB  0   Ectopic  0   Multiple  0   Live Births  2              Patient Active Problem List   Diagnosis Date Noted  . 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 Body aches 05/22/2015  . Fibroids, intramural 02/06/2015  . Other malaise and fatigue 09/27/2013  . Elevated glucose 09/27/2013    Past Medical History:  Diagnosis Date  . Allergy   . Anemia   . Anxiety   . Arthritis   . Asthma   . Depression   . GERD (gastroesophageal reflux disease)   . Hyperlipidemia   . Pneumonia 1987  . Pre-diabetes   . Radial scar of breast   . Reflux   . Single cyst of left breast 02/01/12    Diagnostic mammogram and ultrasound - cyst at 2:30    Past Surgical History:  Procedure Laterality Date  . BREAST LUMPECTOMY WITH RADIOACTIVE SEED LOCALIZATION Left 12/21/2019   Procedure: LEFT BREAST LUMPECTOMY WITH RADIOACTIVE SEED LOCALIZATION;  Surgeon: Erroll Luna, MD;  Location: [place];  Service: General;  Laterality: Left;  . [place]  . COLONOSCOPY    . INTRAUTERINE DEVICE (IUD) INSERTION  08/19/2015   Mirena  . POLYPECTOMY    . UPPER GASTROINTESTINAL ENDOSCOPY    . WISDOM TOOTH EXTRACTION      Current Outpatient Medications  Medication Sig Dispense Refill  . albuterol (PROAIR HFA) 108 (90 Base) MCG/ACT inhaler Inhale 2 puffs into the lungs every 4 (four) hours as needed for wheezing or shortness of breath. 1 Inhaler 3  . budesonide-formoterol (SYMBICORT) 160-4.5 MCG/ACT inhaler Inhale 2 puffs into the lungs 2 (two) times daily. Reported on 05/21/2015 1 Inhaler 11  . Cholecalciferol (VITAMIN D-3) 1000 units CAPS Take 1 capsule by mouth daily.    . Coenzyme Q10 (CO Q 10 PO) Take by mouth.    . CRESTOR 5 MG tablet Take 5 mg by mouth every evening.  12  . dexlansoprazole (DEXILANT) 60 MG capsule Take one capsule every morning 30 capsule 3  . EPINEPHrine (EPIPEN 2-PAK) 0.3 mg/0.3 mL IJ SOAJ injection Inject 0.3 mLs (0.3 mg total) into the muscle once. 2 Device 2  . fluvoxaMINE (LUVOX) 100 MG tablet Take 100 mg by mouth at bedtime. Taking 150mg  daily    . traZODone (DESYREL) 50 MG tablet Take 50 mg by mouth at bedtime.      Current Facility-Administered Medications  Medication Dose Route Frequency Provider Last Rate Last Admin  . 0.9 %  sodium chloride infusion  500 mL Intravenous Once , MD         ALLERGIES: Shellfish allergy and Penicillins  Family History  Problem Relation Age of Onset  . Breast cancer Mother 98  . Depression Mother   . Anxiety disorder Mother   . Osteoporosis Mother   . Thyroid disease Mother         on synthroid  . Depression Sister   . Anxiety disorder Sister   . Stroke Father        died 03-02-12  . Dementia Father   . Anxiety disorder Brother   . Depression Brother   . Dystonia Brother        cervical  . Colon cancer Neg Hx   . Colon polyps Neg Hx   . Esophageal cancer Neg Hx   . Rectal cancer Neg Hx   . Stomach cancer Neg Hx     Social History   Socioeconomic History  . Marital status: Married    Spouse name: Not on file  . Number of children: Not on file  . Years of education: Not on file  . Highest education level: Not on file  Occupational History  . Not on file  Tobacco Use  . Smoking status: Never Smoker  . Smokeless tobacco: Never Used  Vaping Use  . Vaping Use: Never used  Substance and Sexual Activity  . Alcohol use: Yes    Alcohol/week: 0.0 - 1.0 standard drinks  . Drug use: No  . Sexual activity: Yes    Partners: Male    Comment: Mirena Inserted 08/19/15  Other Topics Concern  . Not on file  Social History Narrative  . Not on file   Social Determinants of Health   Financial Resource Strain: Not on file  Food Insecurity: Not on file  Transportation Needs: Not on file  Physical Activity: Not on file  Stress: Not on file  Social Connections: Not on file  Intimate Partner Violence: Not on file    ROS  PHYSICAL EXAMINATION:    LMP 01/23/2017     General appearance: alert, cooperative and appears stated age Neck: no adenopathy, supple, symmetrical, trachea midline and thyroid {CHL AMB PHY EX THYROID NORM DEFAULT:[phone]::"normal to inspection and palpation"} Breasts: {Exam; breast:13139::"normal appearance, no masses or tenderness"} Abdomen: soft, non-tender; non distended, no masses,  no organomegaly  Pelvic: External genitalia:  no lesions              Urethra:  normal appearing urethra with no masses, tenderness or lesions              Bartholins and Skenes: normal                 Vagina: normal appearing vagina with normal  color and discharge, no lesions              Cervix: {CHL AMB PHY EX CERVIX NORM [phone]::"no lesions"}  Bimanual Exam:  Uterus:  {CHL AMB PHY EX UTERUS NORM [phone]::"normal size, contour, position, consistency, mobility, non-tender"}              Adnexa: {CHL AMB PHY EX ADNEXA NO MASS DEFAULT:[phone]::"no mass, fullness, tenderness"}              Rectovaginal: {yes no:[id number]}.  Confirms.              Anus:  normal sphincter tone, no lesions  Chaperone was present for exam.  ASSESSMENT     PLAN

## 2020-02-06 ENCOUNTER — Encounter: Payer: Self-pay | Admitting: Obstetrics and Gynecology

## 2020-02-06 ENCOUNTER — Telehealth (INDEPENDENT_AMBULATORY_CARE_PROVIDER_SITE_OTHER): Payer: No Typology Code available for payment source | Admitting: Obstetrics and Gynecology

## 2020-02-06 DIAGNOSIS — Z9189 Other specified personal risk factors, not elsewhere classified: Secondary | ICD-10-CM

## 2020-02-06 DIAGNOSIS — N951 Menopausal and female climacteric states: Secondary | ICD-10-CM

## 2020-02-06 DIAGNOSIS — Z79890 Hormone replacement therapy (postmenopausal): Secondary | ICD-10-CM

## 2020-02-06 MED ORDER — ESTRADIOL 0.025 MG/24HR TD PTTW: 1.0000 | MEDICATED_PATCH | TRANSDERMAL | 1 refills | Status: DC

## 2020-02-06 MED ORDER — PROGESTERONE MICRONIZED 100 MG PO CAPS: 100.0000 mg | ORAL_CAPSULE | Freq: Every day | ORAL | 1 refills | Status: DC

## 2020-02-06 NOTE — Patient Instructions (Signed)
Menopause and Hormone Replacement Therapy Menopause is a normal time of life when menstrual periods stop completely and the ovaries stop producing the female hormones estrogen and progesterone. This lack of hormones can affect your health and cause undesirable symptoms. Hormone replacement therapy (HRT) can relieve some of those symptoms. What is hormone replacement therapy? HRT is the use of artificial (synthetic) hormones to replace hormones that your body has stopped producing because you have reached menopause. What are my options for HRT?  HRT may consist of the synthetic hormones estrogen and progestin, or it may consist of only estrogen (estrogen-only therapy). You and your health care provider will decide which form of HRT is best for you. If you choose to be on HRT and you have a uterus, estrogen and progestin are usually prescribed. Estrogen-only therapy is used for women who do not have a uterus. Possible options for taking HRT include:  Pills.  Patches.  Gels.  Sprays.  Vaginal cream.  Vaginal rings.  Vaginal inserts. The amount of hormone(s) that you take and how long you take the hormone(s) varies according to your health. It is important to:  Begin HRT with the lowest possible dosage.  Stop HRT as soon as your health care provider tells you to stop.  Work with your health care provider so that you feel informed and comfortable with your decisions. What are the benefits of HRT? HRT can reduce the frequency and severity of menopausal symptoms. Benefits of HRT vary according to the kind of symptoms that you have, how severe they are, and your overall health. HRT may help to improve the following symptoms of menopause:  Hot flashes and night sweats. These are sudden feelings of heat that spread over the face and body. The skin may turn red, like a blush. Night sweats are hot flashes that happen while you are sleeping or trying to sleep.  Bone loss (osteoporosis). The  body loses calcium more quickly after menopause, causing the bones to become weaker. This can increase the risk for bone breaks (fractures).  Vaginal dryness. The lining of the vagina can become thin and dry, which can cause pain during sex or cause infection, burning, or itching.  Urinary tract infections.  Urinary incontinence. This is the inability to control when you pass urine.  Irritability.  Short-term memory problems. What are the risks of HRT? Risks of HRT vary depending on your individual health and medical history. Risks of HRT also depend on whether you receive both estrogen and progestin or you receive estrogen only. HRT may increase the risk of:  Spotting. This is when a small amount of blood leaks from the vagina unexpectedly.  Endometrial cancer. This cancer is in the lining of the uterus (endometrium).  Breast cancer.  Increased density of breast tissue. This can make it harder to find breast cancer on a breast X-ray (mammogram).  Stroke.  Heart disease.  Blood clots.  Gallbladder disease.  Liver disease. Risks of HRT can increase if you have any of the following conditions:  Endometrial cancer.  Liver disease.  Heart disease.  Breast cancer.  History of blood clots.  History of stroke. Follow these instructions at home:  Take over-the-counter and prescription medicines only as told by your health care provider.  Get mammograms, pelvic exams, and medical checkups as often as told by your health care provider.  Have Pap tests done as often as told by your health care provider. A Pap test is sometimes called a Pap smear. It   is a screening test that is used to check for signs of cancer of the cervix and vagina. A Pap test can also identify the presence of infection or precancerous changes. Pap tests may be done: ? Every 3 years, starting at age 21. ? Every 5 years, starting after age 30, in combination with testing for human papillomavirus  (HPV). ? More often or less often depending on other medical conditions you have, your age, and other risk factors.  It is up to you to get the results of your Pap test. Ask your health care provider, or the department that is doing the test, when your results will be ready.  Keep all follow-up visits as told by your health care provider. This is important. Contact a health care provider if you have:  Pain or swelling in your legs.  Shortness of breath.  Chest pain.  Lumps or changes in your breasts or armpits.  Slurred speech.  Pain, burning, or bleeding when you urinate.  Unusual vaginal bleeding.  Dizziness or headaches.  Weakness or numbness in any part of your arms or legs.  Pain in your abdomen. Summary  Menopause is a normal time of life when menstrual periods stop completely and the ovaries stop producing the female hormones estrogen and progesterone.  Hormone replacement therapy (HRT) can relieve some of the symptoms of menopause.  HRT can reduce the frequency and severity of menopausal symptoms.  Risks of HRT vary depending on your individual health and medical history. This information is not intended to replace advice given to you by your health care provider. Make sure you discuss any questions you have with your health care provider. Document Revised: 09/28/2017 Document Reviewed: 09/28/2017 Elsevier Patient Education  2020 Elsevier Inc.  

## 2020-02-06 NOTE — Progress Notes (Signed)
Virtual Visit via Video Note  I connected with Allison Soto on 02/06/20 at 10:00 AM EST by a video enabled telemedicine application and verified that I am speaking with the correct person using two identifiers.  Location: Patient: Home Provider: [facility] office.    I discussed the limitations of evaluation and management by telemedicine and the availability of in person appointments. The patient expressed understanding and agreed to proceed.  GYNECOLOGY  VISIT   HPI: 59 y.o.   Married White or Caucasian Not Hispanic or Latino  female   [phone] with Patient's last menstrual period was 01/23/2017.   here for discussion about HRT.  She went off HRT in the last few months after having a breast lumpectomy for radial scar, no malignancy was identified. She had been on oral estradiol and had the mirena IUD. The IUD was removed in 10/21 when she stopped the estrogen.  She is having bad vasomotor symptoms, starting to have some increase in hair loss (happened last time she went off HRT).  She wakes up a couple of times a night and is damp. She then has trouble going back to sleep. Would like to go back on HRT.    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 He discussed the possibility of seeing a Dietitian.  GYNECOLOGIC HISTORY: Patient's last menstrual period was 01/23/2017. Contraception:none Menopausal hormone therapy: none        OB History    Gravida  2   Para  2   Term  2   Preterm  0   AB  0   Living  2     SAB  0   IAB  0   Ectopic  0   Multiple  0   Live Births  2              Patient Active Problem List   Diagnosis Date Noted  . 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 Body aches 05/22/2015  . Fibroids, intramural 02/06/2015  . Other malaise and fatigue  09/27/2013  . Elevated glucose 09/27/2013    Past Medical History:  Diagnosis Date  . Allergy   . Anemia   . Anxiety   . Arthritis   . Asthma   . Depression   . GERD (gastroesophageal reflux disease)   . Hyperlipidemia   . Pneumonia 1987  . Pre-diabetes   . Radial scar of breast   . Reflux   . Single cyst of left breast 02/01/12   Diagnostic mammogram and ultrasound - cyst at 2:30    Past Surgical History:  Procedure Laterality Date  . BREAST LUMPECTOMY WITH RADIOACTIVE SEED LOCALIZATION Left 12/21/2019   Procedure: LEFT BREAST LUMPECTOMY WITH RADIOACTIVE SEED LOCALIZATION;  Surgeon: Erroll Luna, MD;  Location: [facility];  Service: General;  Laterality: Left;  . [place]  . COLONOSCOPY    . INTRAUTERINE DEVICE (IUD) INSERTION  08/19/2015   Mirena  . POLYPECTOMY    . UPPER GASTROINTESTINAL ENDOSCOPY    . WISDOM TOOTH EXTRACTION      Current Outpatient Medications  Medication Sig Dispense Refill  . albuterol (PROAIR HFA) 108 (90 Base) MCG/ACT inhaler Inhale 2 puffs into the lungs every 4 (four) hours as needed for wheezing or shortness of breath. 1  Inhaler 3  . budesonide-formoterol (SYMBICORT) 160-4.5 MCG/ACT inhaler Inhale 2 puffs into the lungs 2 (two) times daily. Reported on 05/21/2015 1 Inhaler 11  . Cholecalciferol (VITAMIN D-3) 1000 units CAPS Take 1 capsule by mouth daily.    . Coenzyme Q10 (CO Q 10 PO) Take by mouth.    . CRESTOR 5 MG tablet Take 5 mg by mouth every evening.  12  . dexlansoprazole (DEXILANT) 60 MG capsule Take one capsule every morning 30 capsule 3  . EPINEPHrine (EPIPEN 2-PAK) 0.3 mg/0.3 mL IJ SOAJ injection Inject 0.3 mLs (0.3 mg total) into the muscle once. 2 Device 2  . fluvoxaMINE (LUVOX) 100 MG tablet Take 100 mg by mouth at bedtime. Taking 150mg  daily    . traZODone (DESYREL) 50 MG tablet Take 50 mg by mouth at bedtime.      Current Facility-Administered Medications  Medication Dose Route Frequency  Provider Last Rate Last Admin  . 0.9 %  sodium chloride infusion  500 mL Intravenous Once , MD         ALLERGIES: Shellfish allergy and Penicillins  Family History  Problem Relation Age of Onset  . Breast cancer Mother 44  . Depression Mother   . Anxiety disorder Mother   . Osteoporosis Mother   . Thyroid disease Mother        on synthroid  . Depression Sister   . Anxiety disorder Sister   . Stroke Father        died Feb 18, 2012  . Dementia Father   . Anxiety disorder Brother   . Depression Brother   . Dystonia Brother        cervical  . Colon cancer Neg Hx   . Colon polyps Neg Hx   . Esophageal cancer Neg Hx   . Rectal cancer Neg Hx   . Stomach cancer Neg Hx   Mom with DCIS, triple negative at 75.   Social History   Socioeconomic History  . Marital status: Married    Spouse name: Not on file  . Number of children: Not on file  . Years of education: Not on file  . Highest education level: Not on file  Occupational History  . Not on file  Tobacco Use  . Smoking status: Never Smoker  . Smokeless tobacco: Never Used  Vaping Use  . Vaping Use: Never used  Substance and Sexual Activity  . Alcohol use: Yes    Alcohol/week: 0.0 - 1.0 standard drinks  . Drug use: No  . Sexual activity: Yes    Partners: Male    Comment: Mirena Inserted 08/19/15  Other Topics Concern  . Not on file  Social History Narrative  . Not on file   Social Determinants of Health   Financial Resource Strain: Not on file  Food Insecurity: Not on file  Transportation Needs: Not on file  Physical Activity: Not on file  Stress: Not on file  Social Connections: Not on file  Intimate Partner Violence: Not on file    ROS  PHYSICAL EXAMINATION:    LMP 01/23/2017     General appearance: alert, cooperative and appears stated age  ASSESSMENT Vasomotor symptoms off of HRT Elevated risk of breast cancer estimated between 12-20% by her breast surgeon    PLAN We discussed the  risks of HRT, including increasing her risk of breast cancer, blood clots and heart disease. She understands these risks and would like to start. Will use a patch to decrease her risk some, prometrium  for progesterone. Referral placed to see the genetic counselor to better delineate her risk. This will help with decisions for abbreviated or regular breast MRI's All of her questions were answered Risks of HRT in my chart

## 2020-02-12 ENCOUNTER — Encounter: Payer: Self-pay | Admitting: Obstetrics and Gynecology

## 2020-02-15 ENCOUNTER — Encounter: Payer: Self-pay | Admitting: Licensed Clinical Social Worker

## 2020-02-15 ENCOUNTER — Ambulatory Visit (HOSPITAL_BASED_OUTPATIENT_CLINIC_OR_DEPARTMENT_OTHER): Payer: No Typology Code available for payment source | Admitting: Licensed Clinical Social Worker

## 2020-02-15 DIAGNOSIS — Z803 Family history of malignant neoplasm of breast: Secondary | ICD-10-CM | POA: Diagnosis not present

## 2020-02-15 NOTE — Progress Notes (Signed)
REFERRING PROVIDER: Salvadore Dom, [address] [place],  [place] [address]  PRIMARY PROVIDER:  Serita Grammes, MD  PRIMARY REASON FOR VISIT:  1. Family history of breast cancer    I connected with Allison Soto on 02/15/2020 at 9:00 AM  EDT by MyChart video conference and verified that I am speaking with the correct person using two identifiers.    Patient location: home Provider location: [place]:   Allison Soto, a 60 y.o. female, was seen for a [facility] cancer genetics consultation at the request of Dr. Talbert Nan due to a family history of breast cancer.  Allison Soto presents to clinic today to discuss the possibility of a hereditary predisposition to cancer, genetic testing, and to further clarify her future cancer risks, as well as potential cancer risks for family members.    Allison Soto is a 60 y.o. female with no personal history of cancer.    [name] had a breast biopsy in September 2021 that showed usual ductal hyperplasia, which she then had a lumpectomy for in November. Allison Soto calculated to be 22.9%.   CANCER HISTORY:  Oncology History   No history exists.     RISK FACTORS:  Menarche was at age 51.  First live birth at age 12.  Ovaries intact: yes.  Hysterectomy: no.  Menopausal status: postmenopausal.  HRT use: 4 years. Colonoscopy: yes; reports approx. 10 polyps. Mammogram within the last year: yes. Number of breast biopsies: 1. Up to date with pelvic exams: yes. Any excessive radiation exposure in the past: no  Past Medical History:  Diagnosis Date  . Allergy   . Anemia   . Anxiety   . Arthritis   . Asthma   . Depression   . Family history of breast cancer   . GERD (gastroesophageal reflux disease)   . Hyperlipidemia   . Pneumonia 1987  . Pre-diabetes   . Radial scar of breast   . Reflux   . Single cyst of left breast 02/01/12   Diagnostic mammogram and ultrasound - cyst  at 2:30    Past Surgical History:  Procedure Laterality Date  . BREAST LUMPECTOMY WITH RADIOACTIVE SEED LOCALIZATION Left 12/21/2019   Procedure: LEFT BREAST LUMPECTOMY WITH RADIOACTIVE SEED LOCALIZATION;  Surgeon: Erroll Luna, MD;  Location: [facility];  Service: General;  Laterality: Left;  . [place]  . COLONOSCOPY    . INTRAUTERINE DEVICE (IUD) INSERTION  08/19/2015   Mirena  . POLYPECTOMY    . UPPER GASTROINTESTINAL ENDOSCOPY    . WISDOM TOOTH EXTRACTION      Social History   Socioeconomic History  . Marital status: Married    Spouse name: Not on file  . Number of children: Not on file  . Years of education: Not on file  . Highest education level: Not on file  Occupational History  . Not on file  Tobacco Use  . Smoking status: Never Smoker  . Smokeless tobacco: Never Used  Vaping Use  . Vaping Use: Never used  Substance and Sexual Activity  . Alcohol use: Yes    Alcohol/week: 0.0 - 1.0 standard drinks  . Drug use: No  . Sexual activity: Yes    Partners: Male    Comment: Mirena Inserted 08/19/15  Other Topics Concern  . Not on file  Social History Narrative  . Not on file   Social Determinants of Health   Financial  Resource Strain: Not on file  Food Insecurity: Not on file  Transportation Needs: Not on file  Physical Activity: Not on file  Stress: Not on file  Social Connections: Not on file     FAMILY HISTORY:  We obtained a detailed, 4-generation family history.  Significant diagnoses are listed below: Family History  Problem Relation Age of Onset  . Breast cancer Mother 37  . Depression Mother   . Anxiety disorder Mother   . Osteoporosis Mother   . Thyroid disease Mother        on synthroid  . Depression Sister   . Anxiety disorder Sister   . Stroke Father        died Feb 18, 2012  . Dementia Father   . Alzheimer's disease Paternal Aunt   . Breast cancer Cousin        triple negative  . Breast cancer  Cousin        mom's cousin  . Colon cancer Neg Hx   . Colon polyps Neg Hx   . Esophageal cancer Neg Hx   . Rectal cancer Neg Hx   . Stomach cancer Neg Hx    Allison Soto has 2 sons, ages 34 and 71, no cancers. She has 1 sister, no cancer.  Allison Soto mother is living at 28 and had triple negative breast cancer at 47. Patient has 1 maternal uncle, no cancers, his daughter was recently diagnosed at 85 with triple negative breast cancer. Maternal grandfather died young of alcoholism, grandmother died at 81. Her brother's daughter also had breast cancer.  Allison Soto's father died at 55, no cancer. No known cancers on this side of the family.  Allison Soto is unaware of previous family history of genetic testing for hereditary cancer risks. Patient's maternal ancestors are of [place] descent, and paternal ancestors are of [place] descent. There is no reported Ashkenazi Jewish ancestry. There is no known consanguinity.    GENETIC COUNSELING ASSESSMENT: [name] is a 60 y.o. female with a family history of breast cancer which is somewhat suggestive of a hereditary cancer syndrome and predisposition to cancer. We, therefore, discussed and recommended the following at today's visit.   DISCUSSION: We discussed that approximately 5-10% of breast cancer is hereditary  Most cases of hereditary breast cancer are associated with BRCA1/BRCA2 genes, although there are other genes associated with hereditary cancer as well.  We discussed that testing is beneficial for several reasons including knowing about other cancer risks, identifying potential screening and risk-reduction options that may be appropriate, and to understand if other family members could be at risk for cancer and allow them to undergo genetic testing.   We reviewed the characteristics, features and inheritance patterns of hereditary cancer syndromes. We also discussed genetic testing, including the appropriate family members to test,  the process of testing, insurance coverage and turn-around-time for results. We discussed the implications of a negative, positive and/or variant of uncertain significant result. We recommended Allison Soto pursue genetic testing for the [facility] Multi-Cancer gene panel.   The Multi-Cancer Panel offered by [facility] includes sequencing and/or deletion duplication testing of the following 85 genes: AIP, ALK, APC, ATM, AXIN2,BAP1,  BARD1, BLM, BMPR1A, BRCA1, BRCA2, BRIP1, CASR, CDC73, CDH1, CDK4, CDKN1B, CDKN1C, CDKN2A (p14ARF), CDKN2A (p16INK4a), CEBPA, CHEK2, CTNNA1, DICER1, DIS3L2, EGFR (c.2369C>T, p.Thr790Met variant only), EPCAM (Deletion/duplication testing only), FH, FLCN, GATA2, GPC3, GREM1 (Promoter region deletion/duplication testing only), HOXB13 (c.251G>A, p.Gly84Glu), HRAS, KIT, MAX, MEN1, MET, MITF (c.952G>A, p.Glu318Lys variant only), MLH1, MSH2, MSH3, MSH6,  MUTYH, NBN, NF1, NF2, NTHL1, PALB2, PDGFRA, PHOX2B, PMS2, POLD1, POLE, POT1, PRKAR1A, PTCH1, PTEN, RAD50, RAD51C, RAD51D, RB1, RECQL4, RET, RNF43, RUNX1, SDHAF2, SDHA (sequence changes only), SDHB, SDHC, SDHD, SMAD4, SMARCA4, SMARCB1, SMARCE1, STK11, SUFU, TERC, TERT, TMEM127, TP53, TSC1, TSC2, VHL, WRN and WT1.   Based on Ms. Bonneau's family history of cancer, she meets medical criteria for genetic testing. Despite that she meets criteria, she may still have an out of pocket cost.   We discussed that some people do not want to undergo genetic testing due to fear of genetic discrimination.  A federal law called the Genetic Information Non-Discrimination Act (GINA) of 2008 helps protect individuals against genetic discrimination based on their genetic test results.  It impacts both health insurance and employment.  For health insurance, it protects against increased premiums, being kicked off insurance or being forced to take a test in order to be insured.  For employment it protects against hiring, firing and promoting decisions based on  genetic test results.  Health status due to a cancer diagnosis is not protected under GINA.  This law does not protect life insurance, disability insurance, or other types of insurance.   PLAN: After considering the risks, benefits, and limitations, Ms. Sallis provided informed consent to pursue genetic testing and the blood sample was sent to [facility] for analysis of the Multi-Cancer Panel. Results should be available within approximately 2-3 weeks' time, at which point they will be disclosed by telephone to Ms. Dula, as will any additional recommendations warranted by these results. Ms. Gladue will receive a summary of her genetic counseling visit and a copy of her results once available. This information will also be available in Epic.   Ms. Mackiewicz questions were answered to her satisfaction today. Our contact information was provided should additional questions or concerns arise. Thank you for the referral and allowing [place] to share in the care of your patient.   Faith Rogue, MS, [facility] Genetic Counselor [email or web address] Phone: [phone]  The patient was seen for a total of 30 minutes in face-to-face genetic counseling.  Prospective genetic counseling student, Liliane Bade, was present virtually. Dr. Grayland Ormond was available for discussion regarding this case.   _______________________________________________________________________ For Office Staff:  Number of people involved in session: 2 Was an Intern/ student involved with case: no; observation only

## 2020-03-05 ENCOUNTER — Other Ambulatory Visit: Payer: Self-pay | Admitting: Obstetrics and Gynecology

## 2020-03-05 NOTE — Telephone Encounter (Signed)
Dr.Jertson you had a video visit with patient on 02/06/20 and prescribed progesterone 100mg  and vivelle-dot patch 0.025mg  both Rx sent for 30 day supply with 1 refill for both Rx's. Pharmacy is requesting refill on progesterone which is pending.  Her annual exam in due in July. I wasn't sure how you wanted to proceed with refills.

## 2020-03-05 NOTE — Telephone Encounter (Signed)
The plan was for her to get in touch and let me know how she was doing prior to more refills. She may need adjustment in the estrogen dose. She has been referred to Genetics for further counseling on her risk of breast cancer.

## 2020-03-06 ENCOUNTER — Encounter: Payer: Self-pay | Admitting: Licensed Clinical Social Worker

## 2020-03-06 ENCOUNTER — Ambulatory Visit: Payer: Self-pay | Admitting: Licensed Clinical Social Worker

## 2020-03-06 ENCOUNTER — Telehealth: Payer: Self-pay | Admitting: Licensed Clinical Social Worker

## 2020-03-06 DIAGNOSIS — Z1379 Encounter for other screening for genetic and chromosomal anomalies: Secondary | ICD-10-CM | POA: Insufficient documentation

## 2020-03-06 DIAGNOSIS — Z803 Family history of malignant neoplasm of breast: Secondary | ICD-10-CM

## 2020-03-06 NOTE — Progress Notes (Signed)
HPI:  Allison Soto was previously seen in the [facility] due to a family history of breast cancer and concerns regarding a hereditary predisposition to cancer. Please refer to our prior cancer genetics clinic note for more information regarding our discussion, assessment and recommendations, at the time. Allison Soto recent genetic test results were disclosed to her, as were recommendations warranted by these results. These results and recommendations are discussed in more detail below.  CANCER HISTORY:  Oncology History   No history exists.    FAMILY HISTORY:  We obtained a detailed, 4-generation family history.  Significant diagnoses are listed below: Family History  Problem Relation Age of Onset  . Breast cancer Mother 81  . Depression Mother   . Anxiety disorder Mother   . Osteoporosis Mother   . Thyroid disease Mother        on synthroid  . Depression Sister   . Anxiety disorder Sister   . Stroke Father        died 25-Feb-2012  . Dementia Father   . Alzheimer's disease Paternal Aunt   . Breast cancer Cousin        triple negative  . Breast cancer Cousin        mom's cousin  . Colon cancer Neg Hx   . Colon polyps Neg Hx   . Esophageal cancer Neg Hx   . Rectal cancer Neg Hx   . Stomach cancer Neg Hx    Allison Soto has 2 sons, ages 55 and 75, no cancers. She has 1 sister, no cancer.  Allison Soto mother is living at 50 and had triple negative breast cancer at 12. Patient has 1 maternal uncle, no cancers, his daughter was recently diagnosed at 68 with triple negative breast cancer. Maternal grandfather died young of alcoholism, grandmother died at 80. Her brother's daughter also had breast cancer.  Allison Soto's father died at 36, no cancer. No known cancers on this side of the family.  Allison Soto is unaware of previous family history of genetic testing for hereditary cancer risks. Patient's maternal ancestors are of [place] descent, and  paternal ancestors are of [place] descent. There is no reported Ashkenazi Jewish ancestry. There is no known consanguinity.    GENETIC TEST RESULTS: Genetic testing reported out on 03/06/2020 through the [facility] Multi- cancer panel found no pathogenic mutations.   The Multi-Cancer Panel offered by [facility] includes sequencing and/or deletion duplication testing of the following 85 genes: AIP, ALK, APC, ATM, AXIN2,BAP1,  BARD1, BLM, BMPR1A, BRCA1, BRCA2, BRIP1, CASR, CDC73, CDH1, CDK4, CDKN1B, CDKN1C, CDKN2A (p14ARF), CDKN2A (p16INK4a), CEBPA, CHEK2, CTNNA1, DICER1, DIS3L2, EGFR (c.2369C>T, p.Thr790Met variant only), EPCAM (Deletion/duplication testing only), FH, FLCN, GATA2, GPC3, GREM1 (Promoter region deletion/duplication testing only), HOXB13 (c.251G>A, p.Gly84Glu), HRAS, KIT, MAX, MEN1, MET, MITF (c.952G>A, p.Glu318Lys variant only), MLH1, MSH2, MSH3, MSH6, MUTYH, NBN, NF1, NF2, NTHL1, PALB2, PDGFRA, PHOX2B, PMS2, POLD1, POLE, POT1, PRKAR1A, PTCH1, PTEN, RAD50, RAD51C, RAD51D, RB1, RECQL4, RET, RNF43, RUNX1, SDHAF2, SDHA (sequence changes only), SDHB, SDHC, SDHD, SMAD4, SMARCA4, SMARCB1, SMARCE1, STK11, SUFU, TERC, TERT, TMEM127, TP53, TSC1, TSC2, VHL, WRN and WT1.   The test report has been scanned into EPIC and is located under the Molecular Pathology section of the Results Review tab.  A portion of the result report is included below for reference.     We discussed with Allison Soto that because current genetic testing is not perfect, it is possible there may be a gene mutation in one of these genes  that current testing cannot detect, Soto that chance is small.  We also discussed, that there could be another gene that has not yet been discovered, or that we have not yet tested, that is responsible for the cancer diagnoses in the family. It is also possible there is a hereditary cause for the cancer in the family that Allison Soto did not inherit and therefore was not identified in her testing.   Therefore, it is important to remain in touch with cancer genetics in the future so that we can continue to offer Allison Soto the most up to date genetic testing.   Genetic testing did identify a variant of uncertain significance (VUS) in the BARD1 gene called c.965G>A.  At this time, it is unknown if this variant is associated with increased cancer risk or if this is a normal finding, Soto most variants such as this get reclassified to being inconsequential. It should not be used to make medical management decisions. With time, we suspect the lab will determine the significance of this variant, if any. If we do learn more about it, we will try to contact Allison Soto to discuss it further. However, it is important to stay in touch with [place] periodically and keep the address and phone number up to date.  ADDITIONAL GENETIC TESTING: We discussed with Allison Soto that her genetic testing was fairly extensive.  If there are genes identified to increase cancer risk that can be analyzed in the future, we would be happy to discuss and coordinate this testing at that time.    CANCER SCREENING RECOMMENDATIONS: Allison Soto test result is considered negative (normal).  This means that we have not identified a hereditary cause for her  family history of cancer at this time.   While reassuring, this does not definitively rule out a hereditary predisposition to cancer. It is still possible that there could be genetic mutations that are undetectable by current technology. There could be genetic mutations in genes that have not been tested or identified to increase cancer risk.  Therefore, it is recommended she continue to follow the cancer management and screening guidelines provided by her primary healthcare provider.   An individual's cancer risk and medical management are not determined by genetic test results alone. Overall cancer risk assessment incorporates additional factors, including personal medical history,  family history, and any available genetic information that may result in a personalized plan for cancer prevention and surveillance.  Based on Allison Soto's personal and family history of cancer as well as her genetic test results, risk model [name] was used to estimate her risk of developing breast cancer. This estimates her lifetime risk of developing breast cancer to be approximately 29%.  The patient's lifetime breast cancer risk is a preliminary estimate based on available information using one of several models endorsed by the [place] (ACS). The ACS recommends consideration of breast MRI screening as an adjunct to mammography for patients at high risk (defined as 20% or greater lifetime risk).     Allison Soto has been determined to be at high risk for breast cancer. She is already followed for this indication by Dr. Brantley Stage.   RECOMMENDATIONS FOR FAMILY MEMBERS:  Relatives in this family might be at some increased risk of developing cancer, over the general population risk, simply due to the family history of cancer.  We recommended female relatives in this family have a yearly mammogram beginning at age 67, or 5 years younger than the earliest onset  of cancer, an annual clinical breast exam, and perform monthly breast self-exams. Female relatives in this family should also have a gynecological exam as recommended by their primary provider.  All family members should be referred for colonoscopy starting at age 56.    It is also possible there is a hereditary cause for the cancer in Allison Soto's family that she did not inherit and therefore was not identified in her.  Based on Allison Soto's family history, we recommended maternal relatives have genetic counseling and testing. Allison Soto will let us know if we can be of any assistance in coordinating genetic counseling and/or testing for these family members.  FOLLOW-UP: Lastly, we discussed with Allison Soto that cancer  genetics is a rapidly advancing field and it is possible that new genetic tests will be appropriate for her and/or her family members in the future. We encouraged her to remain in contact with cancer genetics on an annual basis so we can update her personal and family histories and let her know of advances in cancer genetics that may benefit this family.   Our contact number was provided. Ms. Butrick questions were answered to her satisfaction, and she knows she is welcome to call us at anytime with additional questions or concerns.   Faith Rogue, MS, [facility] Genetic Counselor [email or web address] Phone: [phone]

## 2020-03-06 NOTE — Telephone Encounter (Signed)
Revealed negative genetic testing.  Revealed that a VUS in BARD1 was identified. This normal result is reassuring. It is unlikely that there is an increased risk of cancer due to a mutation in one of these genes.  However, genetic testing is not perfect, and cannot definitively rule out a hereditary cause.  It will be important for her to keep in contact with genetics to learn if any additional testing may be needed in the future.  Additionally, her tyrer-cuzick is still showing her to be high risk for breast cancer, so we recommended following up with her high risk breast provider.

## 2020-03-18 ENCOUNTER — Telehealth: Payer: Self-pay | Admitting: Obstetrics and Gynecology

## 2020-03-18 NOTE — Telephone Encounter (Signed)
The following mychart message was sent: Hi Allison Soto, I just reviewed your recent visit with the Dietitian. Even with the negative genetic testing, they estimate your risk of breast cancer at 29%. Given that level of risk, it would be wise to look at alternative options to treat your vasomotor symptoms. The hormone replacement will increase your risk your risk of breast cancer.  Please set up a video visit or in person appointment to further discuss your options.  Best, Sumner Boast

## 2020-04-17 ENCOUNTER — Encounter (HOSPITAL_BASED_OUTPATIENT_CLINIC_OR_DEPARTMENT_OTHER): Payer: Self-pay

## 2020-05-10 ENCOUNTER — Other Ambulatory Visit: Payer: Self-pay

## 2020-05-10 ENCOUNTER — Ambulatory Visit (INDEPENDENT_AMBULATORY_CARE_PROVIDER_SITE_OTHER): Payer: No Typology Code available for payment source | Admitting: Cardiology

## 2020-05-10 ENCOUNTER — Encounter: Payer: Self-pay | Admitting: Cardiology

## 2020-05-10 ENCOUNTER — Ambulatory Visit (INDEPENDENT_AMBULATORY_CARE_PROVIDER_SITE_OTHER): Payer: No Typology Code available for payment source

## 2020-05-10 ENCOUNTER — Encounter: Payer: Self-pay | Admitting: Radiology

## 2020-05-10 VITALS — BP 110/68 | HR 66 | Ht 65.0 in | Wt 147.4 lb

## 2020-05-10 DIAGNOSIS — R42 Dizziness and giddiness: Secondary | ICD-10-CM | POA: Diagnosis not present

## 2020-05-10 DIAGNOSIS — Z8279 Family history of other congenital malformations, deformations and chromosomal abnormalities: Secondary | ICD-10-CM

## 2020-05-10 DIAGNOSIS — R002 Palpitations: Secondary | ICD-10-CM | POA: Diagnosis not present

## 2020-05-10 DIAGNOSIS — Z7189 Other specified counseling: Secondary | ICD-10-CM | POA: Diagnosis not present

## 2020-05-10 NOTE — Progress Notes (Signed)
Enrolled patient for a 14 day Zio XT Monitor to be mailed to patients home  

## 2020-05-10 NOTE — Progress Notes (Signed)
Cardiology Office Note:    Date:  05/10/2020   ID:  Allison Soto, DOB 1960/03/04, MRN [id number]  PCP:  Serita Grammes, MD  Cardiologist:  Buford Dresser, MD  Referring MD: Serita Grammes, MD   CC: new patient consultation for palpitations  History of Present Illness:    Allison Soto is a 60 y.o. female without prior cardiac history who is seen as a new consult at the request of Serita Grammes, MD for the evaluation and management of palpitations.  Note from 04/19/20 from Dr. Jeryl Columbia reviewed. Noted family history of aortic stenosis/bicuspid valve, noted to have palpitations.  Three concerns today: -father had bicuspid aortic valve, has never been screened -has started noticing a flutter in her upper chest -has been having dizzy spells, after standing up and walking.  Tachycardia/palpitations: -Initial onset: first noted several years ago when driving, noticed when she is still. Was happening rarely, every few months. Noticed in the last month that it was happening more frequently. Last week noticed every day. Always at rest.  -Frequency/Duration: Now can be daily, lasts several beats -Associated symptoms: none -Aggravating/alleviating factors: none -Syncope/near syncope: none  Lightheaded spells: -positional, always when she goes from laying in her bed to getting up. Sitting on her bed with her legs up, then within a few steps of getting up she feels dizzy. Not every time she gets up. More in the morning. No syncope.  -Prior cardiac history: none -Prior workup: none -Prior treatment: none -Possible medication interactions: -Caffeine: hot tea in AM, no other caffeine -Alcohol: rare, maybe one drink every other week -Tobacco: never -OTC supplements: none -Comorbidities: GERD, was on dexilant but no longer covered, now on protonix for the last month -Exercise level: walks daily, goes up steep hills on her walks. Gets out of breath at the top  of the hill, but can push through and keep walking.  -Labs: TSH, kidney function/electrolytes, CBC reviewed. -Cardiac ROS: no chest pain, no shortness of breath at rest or with minimal exertion, no PND, no orthopnea, no LE edema. -Family history: father with bicuspid valve, had valve replacement in his 61s. Had hemorrhagic stroke, died age 57. Had hypertension, high cholesterol. Mother has dementia, hypertension, high cholesterol. Teena Irani had aortic stenosis, unknown if bicuspid but she was elderly. No other heart issues in her family.   Past Medical History:  Diagnosis Date   Allergy    Anemia    Anxiety    Arthritis    Asthma    Depression    Family history of breast cancer    GERD (gastroesophageal reflux disease)    Hyperlipidemia    Pneumonia 1987   Pre-diabetes    Radial scar of breast    Reflux    Single cyst of left breast 02/01/12   Diagnostic mammogram and ultrasound - cyst at 2:30    Past Surgical History:  Procedure Laterality Date   BREAST LUMPECTOMY WITH RADIOACTIVE SEED LOCALIZATION Left 12/21/2019   Procedure: LEFT BREAST LUMPECTOMY WITH RADIOACTIVE SEED LOCALIZATION;  Surgeon: Erroll Luna, MD;  Location: [facility];  Service: General;  Laterality: Left;   [place] (IUD) INSERTION  08/19/2015   Mirena   POLYPECTOMY     UPPER GASTROINTESTINAL ENDOSCOPY     WISDOM TOOTH EXTRACTION      Current Medications: Current Outpatient Medications on File Prior to Visit  Medication Sig   albuterol (PROAIR HFA) 108 (90  Base) MCG/ACT inhaler Inhale 2 puffs into the lungs every 4 (four) hours as needed for wheezing or shortness of breath.   budesonide-formoterol (SYMBICORT) 160-4.5 MCG/ACT inhaler Inhale 2 puffs into the lungs 2 (two) times daily. Reported on 05/21/2015   Cholecalciferol (VITAMIN D-3) 1000 units CAPS Take 1 capsule by mouth daily.   Coenzyme Q10 (CO Q 10 PO) Take by mouth.    CRESTOR 5 MG tablet Take 5 mg by mouth every evening.   dexlansoprazole (DEXILANT) 60 MG capsule Take one capsule every morning   EPINEPHrine (EPIPEN 2-PAK) 0.3 mg/0.3 mL IJ SOAJ injection Inject 0.3 mLs (0.3 mg total) into the muscle once.   estradiol (VIVELLE-DOT) 0.025 MG/24HR Place 1 patch onto the skin 2 (two) times a week.   fluvoxaMINE (LUVOX) 100 MG tablet Take 100 mg by mouth at bedtime. Taking 150mg  daily   progesterone (PROMETRIUM) 100 MG capsule Take 1 capsule (100 mg total) by mouth daily.   traZODone (DESYREL) 50 MG tablet Take 50 mg by mouth at bedtime.    Current Facility-Administered Medications on File Prior to Visit  Medication   0.9 %  sodium chloride infusion     Allergies:   Shellfish allergy and Penicillins   Social History   Tobacco Use   Smoking status: Never Smoker   Smokeless tobacco: Never Used  Vaping Use   Vaping Use: Never used  Substance Use Topics   Alcohol use: Yes    Alcohol/week: 0.0 - 1.0 standard drinks   Drug use: No    Family History: family history includes Alzheimer's disease in her paternal aunt; Anxiety disorder in her mother and sister; Breast cancer in her cousin and cousin; Breast cancer (age of onset: 77) in her mother; Dementia in her father; Depression in her mother and sister; Osteoporosis in her mother; Stroke in her father; Thyroid disease in her mother. There is no history of Colon cancer, Colon polyps, Esophageal cancer, Rectal cancer, or Stomach cancer.  ROS:   Please see the history of present illness.  Additional pertinent ROS: Constitutional: Negative for chills, fever, night sweats, unintentional weight loss. + hot flashes HENT: Negative for ear pain and hearing loss.   Eyes: Negative for loss of vision and eye pain.  Respiratory: Negative for cough, sputum, wheezing.   Cardiovascular: See HPI. Gastrointestinal: Negative for abdominal pain, melena, and hematochezia.  Genitourinary: Negative for dysuria and hematuria.   Musculoskeletal: Negative for falls and myalgias.  Skin: Negative for itching and rash.  Neurological: Negative for focal weakness, focal sensory changes and loss of consciousness.  Endo/Heme/Allergies: Does bruise/bleed easily.     EKGs/Labs/Other Studies Reviewed:    The following studies were reviewed today: No prior cardiac studies  EKG:  EKG is personally reviewed.  The ekg ordered today demonstrates normal sinus rhythm at 66 bpm  Recent Labs: 12/18/2019: ALT 32; BUN 12; Creatinine, Ser 0.78; Hemoglobin 15.6; Platelets 247; Potassium 5.0; Sodium 140  Recent Lipid Panel No results found for: CHOL, TRIG, HDL, CHOLHDL, VLDL, LDLCALC, LDLDIRECT  Physical Exam:    VS:  LMP 01/23/2017     Wt Readings from Last 3 Encounters:  01/16/20 144 lb (65.3 kg)  01/03/20 144 lb 6.4 oz (65.5 kg)  12/21/19 144 lb 13.5 oz (65.7 kg)    GEN: Well nourished, well developed in no acute distress HEENT: Normal, moist mucous membranes NECK: No JVD CARDIAC: regular rhythm, normal S1 and S2, no rubs or gallops. No murmur. VASCULAR: Radial and DP pulses 2+ bilaterally. No  carotid bruits RESPIRATORY:  Clear to auscultation without rales, wheezing or rhonchi  ABDOMEN: Soft, non-tender, non-distended MUSCULOSKELETAL:  Ambulates independently SKIN: Warm and dry, no edema NEUROLOGIC:  Alert and oriented x 3. No focal neuro deficits noted. PSYCHIATRIC:  Normal affect    ASSESSMENT:    1. Family history of bicuspid aortic valve   2. Palpitations   3. Episodic lightheadedness   4. Cardiac risk counseling   5. Counseling on health promotion and disease prevention    PLAN:    Family history of bicuspid aortic valve -screen with echocardiogram  Palpitations Episodic lightheadedness -zio monitor, echo as above  Cardiac risk counseling and prevention recommendations: -recommend heart healthy/Mediterranean diet, with whole grains, fruits, vegetable, fish, lean meats, nuts, and olive oil. Limit  salt. -recommend moderate walking, 3-5 times/week for 30-50 minutes each session. Aim for at least 150 minutes.week. Goal should be pace of 3 miles/hours, or walking 1.5 miles in 30 minutes -recommend avoidance of tobacco products. Avoid excess alcohol. -Additional risk factor control:  -Diabetes risk: A1c is 5.4 10/30/19  -Lipids: per KPN  -Blood pressure control: well controlled on no meds  -Weight: BMI 24  Plan for follow up: to be determine based on results of testing  Buford Dresser, MD, PhD, [facility]    Medication Adjustments/Labs and Tests Ordered: Current medicines are reviewed at length with the patient today.  Concerns regarding medicines are outlined above.  Orders Placed This Encounter  Procedures   LONG TERM MONITOR (3-14 DAYS)   EKG 12-Lead   ECHOCARDIOGRAM COMPLETE   No orders of the defined types were placed in this encounter.   Patient Instructions  Medication Instructions:  Your Physician recommend you continue on your current medication as directed.    *If you need a refill on your cardiac medications before your next appointment, please call your pharmacy*   Lab Work: None   Testing/Procedures: Your physician has requested that you have an echocardiogram. Echocardiography is a painless test that uses sound waves to create images of your heart. It provides your doctor with information about the size and shape of your heart and how well your heart's chambers and valves are working. This procedure takes approximately one hour. There are no restrictions for this procedure. [place] [address]  Our physician has recommended that you wear an 14  DAY ZIO-PATCH monitor. The Zio patch cardiac monitor continuously records heart rhythm data for up to 14 days, this is for patients being evaluated for multiple types heart rhythms. For the first 24 hours post application, please avoid getting the Zio monitor wet in the shower or  by excessive sweating during exercise. After that, feel free to carry on with regular activities. Keep soaps and lotions away from the ZIO XT Patch.   Someone from our office will call to verify address and mail monitor.    Follow-Up: At [facility], you and your health needs are our priority.  As part of our continuing mission to provide you with exceptional heart care, we have created designated Provider Care Teams.  These Care Teams include your primary Cardiologist (physician) and Advanced Practice Providers (APPs -  Physician Assistants and Nurse Practitioners) who all work together to provide you with the care you need, when you need it.  We recommend signing up for the patient portal called "MyChart".  Sign up information is provided on this After Visit Summary.  MyChart is used to connect with patients for Virtual  Visits (Telemedicine).  Patients are able to view lab/test results, encounter notes, upcoming appointments, etc.  Non-urgent messages can be sent to your provider as well.   To learn more about what you can do with MyChart, go to [email or web address].    Your next appointment:   As needed  The format for your next appointment:   In Person  Provider:   Buford Dresser, MD    [place] Instructions   Your physician has requested you wear your ZIO patch monitor__14_____days.   This is a single patch monitor.  [employer] supplies one patch monitor per enrollment.  Additional stickers are not available.   Please do not apply patch if you will be having a Nuclear Stress Test, Echocardiogram, Cardiac CT, MRI, or Chest Xray during the time frame you would be wearing the monitor. The patch cannot be worn during these tests.  You cannot remove and re-apply the ZIO XT patch monitor.   Your ZIO patch monitor will be sent USPS Priority mail from [facility] directly to your home address. The monitor may also be mailed to a PO BOX if home delivery  is not available.   It may take 3-5 days to receive your monitor after you have been enrolled.   Once you have received you monitor, please review enclosed instructions.  Your monitor has already been registered assigning a specific monitor serial # to you.   Applying the monitor   Shave hair from upper left chest.   Hold abrader disc by orange tab.  Rub abrader in 40 strokes over left upper chest as indicated in your monitor instructions.   Clean area with 4 enclosed alcohol pads .  Use all pads to assure are is cleaned thoroughly.  Let dry.   Apply patch as indicated in monitor instructions.  Patch will be place under collarbone on left side of chest with arrow pointing upward.   Rub patch adhesive wings for 2 minutes.Remove white label marked "1".  Remove white label marked "2".  Rub patch adhesive wings for 2 additional minutes.   While looking in a mirror, press and release button in center of patch.  A small green light will flash 3-4 times .  This will be your only indicator the monitor has been turned on.     Do not shower for the first 24 hours.  You may shower after the first 24 hours.   Press button if you feel a symptom. You will hear a small click.  Record Date, Time and Symptom in the Patient Log Book.   When you are ready to remove patch, follow instructions on last 2 pages of Patient Log Book.  Stick patch monitor onto last page of Patient Log Book.   Place Patient Log Book in [place] box.  Use locking tab on box and tape box closed securely.  The [employer] and [employer] has [employer] on it.  Please place in mailbox as soon as possible.  Your physician should have your test results approximately 7 days after the monitor has been mailed back to [facility].   Call [place] at [phone] if you have questions regarding your ZIO XT patch monitor.  Call them immediately if you see an orange light blinking on your monitor.   If your monitor falls off  in less than 4 days contact our Monitor department at [phone].  If your monitor becomes loose or falls off after 4 days call [employer] at [phone]  for suggestions on securing your monitor.       Signed, Buford Dresser, MD PhD 05/10/2020 10:45 PM    [facility]

## 2020-05-10 NOTE — Patient Instructions (Signed)
Medication Instructions:  Your Physician recommend you continue on your current medication as directed.    *If you need a refill on your cardiac medications before your next appointment, please call your pharmacy*   Lab Work: None   Testing/Procedures: Your physician has requested that you have an echocardiogram. Echocardiography is a painless test that uses sound waves to create images of your heart. It provides your doctor with information about the size and shape of your heart and how well your heart's chambers and valves are working. This procedure takes approximately one hour. There are no restrictions for this procedure. [place] [address]  Our physician has recommended that you wear an 14  DAY ZIO-PATCH monitor. The Zio patch cardiac monitor continuously records heart rhythm data for up to 14 days, this is for patients being evaluated for multiple types heart rhythms. For the first 24 hours post application, please avoid getting the Zio monitor wet in the shower or by excessive sweating during exercise. After that, feel free to carry on with regular activities. Keep soaps and lotions away from the ZIO XT Patch.   Someone from our office will call to verify address and mail monitor.    Follow-Up: At [facility], you and your health needs are our priority.  As part of our continuing mission to provide you with exceptional heart care, we have created designated Provider Care Teams.  These Care Teams include your primary Cardiologist (physician) and Advanced Practice Providers (APPs -  Physician Assistants and Nurse Practitioners) who all work together to provide you with the care you need, when you need it.  We recommend signing up for the patient portal called "MyChart".  Sign up information is provided on this After Visit Summary.  MyChart is used to connect with patients for Virtual Visits (Telemedicine).  Patients are able to view lab/test results, encounter notes,  upcoming appointments, etc.  Non-urgent messages can be sent to your provider as well.   To learn more about what you can do with MyChart, go to [email or web address].    Your next appointment:   As needed  The format for your next appointment:   In Person  Provider:   Buford Dresser, MD   [place] Instructions   Your physician has requested you wear your ZIO patch monitor__14_____days.   This is a single patch monitor.  [employer] supplies one patch monitor per enrollment.  Additional stickers are not available.   Please do not apply patch if you will be having a Nuclear Stress Test, Echocardiogram, Cardiac CT, MRI, or Chest Xray during the time frame you would be wearing the monitor. The patch cannot be worn during these tests.  You cannot remove and re-apply the ZIO XT patch monitor.   Your ZIO patch monitor will be sent USPS Priority mail from [facility] directly to your home address. The monitor may also be mailed to a PO BOX if home delivery is not available.   It may take 3-5 days to receive your monitor after you have been enrolled.   Once you have received you monitor, please review enclosed instructions.  Your monitor has already been registered assigning a specific monitor serial # to you.   Applying the monitor   Shave hair from upper left chest.   Hold abrader disc by orange tab.  Rub abrader in 40 strokes over left upper chest as indicated in your monitor instructions.   Clean area with 4 enclosed alcohol pads .  Use all pads to assure are is cleaned thoroughly.  Let dry.   Apply patch as indicated in monitor instructions.  Patch will be place under collarbone on left side of chest with arrow pointing upward.   Rub patch adhesive wings for 2 minutes.Remove white label marked "1".  Remove white label marked "2".  Rub patch adhesive wings for 2 additional minutes.   While looking in a mirror, press and release button in center of  patch.  A small green light will flash 3-4 times .  This will be your only indicator the monitor has been turned on.     Do not shower for the first 24 hours.  You may shower after the first 24 hours.   Press button if you feel a symptom. You will hear a small click.  Record Date, Time and Symptom in the Patient Log Book.   When you are ready to remove patch, follow instructions on last 2 pages of Patient Log Book.  Stick patch monitor onto last page of Patient Log Book.   Place Patient Log Book in [place] box.  Use locking tab on box and tape box closed securely.  The [employer] has [employer] on it.  Please place in mailbox as soon as possible.  Your physician should have your test results approximately 7 days after the monitor has been mailed back to [facility].   Call [place] at [phone] if you have questions regarding your ZIO XT patch monitor.  Call them immediately if you see an orange light blinking on your monitor.   If your monitor falls off in less than 4 days contact our Monitor department at [phone].  If your monitor becomes loose or falls off after 4 days call [employer] at [phone] for suggestions on securing your monitor.

## 2020-05-16 DIAGNOSIS — R002 Palpitations: Secondary | ICD-10-CM

## 2020-05-31 ENCOUNTER — Other Ambulatory Visit: Payer: Self-pay

## 2020-06-18 ENCOUNTER — Other Ambulatory Visit (HOSPITAL_COMMUNITY): Payer: No Typology Code available for payment source

## 2020-07-09 ENCOUNTER — Ambulatory Visit (HOSPITAL_COMMUNITY): Payer: No Typology Code available for payment source | Attending: Internal Medicine

## 2020-07-09 ENCOUNTER — Other Ambulatory Visit: Payer: Self-pay

## 2020-07-09 DIAGNOSIS — Z8279 Family history of other congenital malformations, deformations and chromosomal abnormalities: Secondary | ICD-10-CM | POA: Diagnosis not present

## 2020-07-09 DIAGNOSIS — R002 Palpitations: Secondary | ICD-10-CM | POA: Diagnosis not present

## 2020-07-09 LAB — ECHOCARDIOGRAM COMPLETE
Area-P 1/2: 3.19 cm2
S' Lateral: 3 cm

## 2020-07-11 ENCOUNTER — Other Ambulatory Visit: Payer: Self-pay | Admitting: Family Medicine

## 2020-07-11 DIAGNOSIS — Z1231 Encounter for screening mammogram for malignant neoplasm of breast: Secondary | ICD-10-CM

## 2020-07-19 NOTE — Telephone Encounter (Signed)
Results reviewed in mychart.  

## 2020-08-08 ENCOUNTER — Encounter: Payer: Self-pay | Admitting: Cardiology

## 2020-09-04 ENCOUNTER — Ambulatory Visit: Payer: No Typology Code available for payment source

## 2020-09-05 ENCOUNTER — Other Ambulatory Visit: Payer: Self-pay

## 2020-09-05 ENCOUNTER — Ambulatory Visit
Admission: RE | Admit: 2020-09-05 | Discharge: 2020-09-05 | Disposition: A | Discharge status: Home / Self Care | Payer: No Typology Code available for payment source | Source: Ambulatory Visit | Attending: Family Medicine | Admitting: Family Medicine

## 2020-09-05 ENCOUNTER — Encounter (HOSPITAL_BASED_OUTPATIENT_CLINIC_OR_DEPARTMENT_OTHER): Payer: Self-pay | Admitting: Obstetrics & Gynecology

## 2020-09-05 ENCOUNTER — Ambulatory Visit (INDEPENDENT_AMBULATORY_CARE_PROVIDER_SITE_OTHER): Payer: No Typology Code available for payment source | Admitting: Obstetrics & Gynecology

## 2020-09-05 VITALS — BP 120/79 | HR 69 | Ht 64.5 in | Wt 144.6 lb

## 2020-09-05 DIAGNOSIS — Z803 Family history of malignant neoplasm of breast: Secondary | ICD-10-CM

## 2020-09-05 DIAGNOSIS — Z01419 Encounter for gynecological examination (general) (routine) without abnormal findings: Secondary | ICD-10-CM

## 2020-09-05 DIAGNOSIS — Z9189 Other specified personal risk factors, not elsewhere classified: Secondary | ICD-10-CM | POA: Diagnosis not present

## 2020-09-05 DIAGNOSIS — Z1231 Encounter for screening mammogram for malignant neoplasm of breast: Secondary | ICD-10-CM

## 2020-09-05 DIAGNOSIS — Z780 Asymptomatic menopausal state: Secondary | ICD-10-CM

## 2020-09-05 DIAGNOSIS — D251 Intramural leiomyoma of uterus: Secondary | ICD-10-CM

## 2020-09-05 DIAGNOSIS — N6489 Other specified disorders of breast: Secondary | ICD-10-CM

## 2020-09-05 IMAGING — MG MM DIGITAL SCREENING BILAT W/ TOMO AND CAD
8 series · 9 of 24 positions shown · non-contrast
Comparison: Previous exam(s).

CLINICAL DATA: Screening.

EXAM:
DIGITAL SCREENING BILATERAL MAMMOGRAM WITH TOMOSYNTHESIS AND CAD
TECHNIQUE: Bilateral screening digital craniocaudal and mediolateral oblique
mammograms were obtained. Bilateral screening digital breast
tomosynthesis was performed. The images were evaluated with
computer-aided detection.

[L MLO synth-2D]
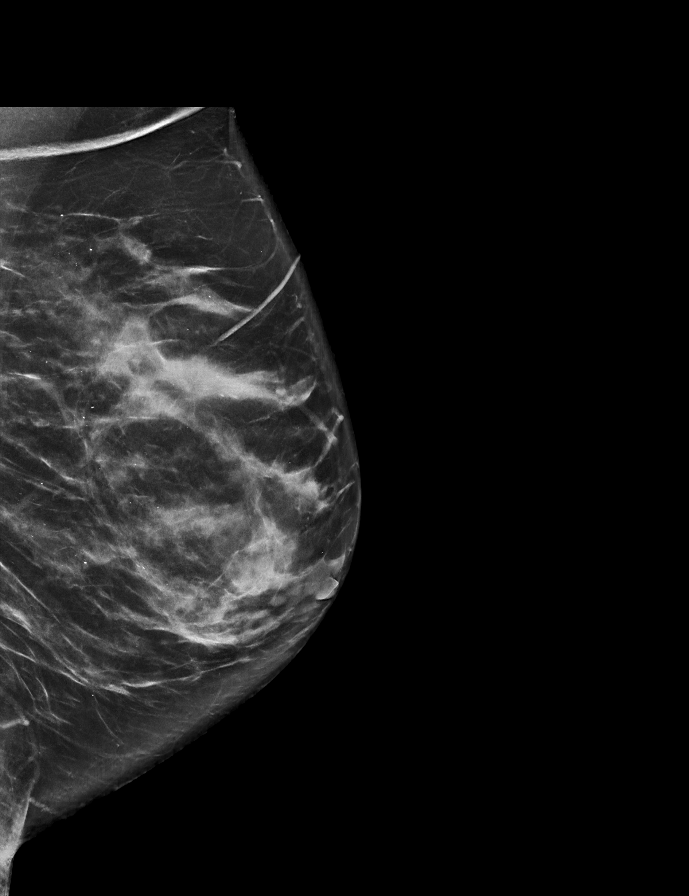

[R MLO synth-2D]
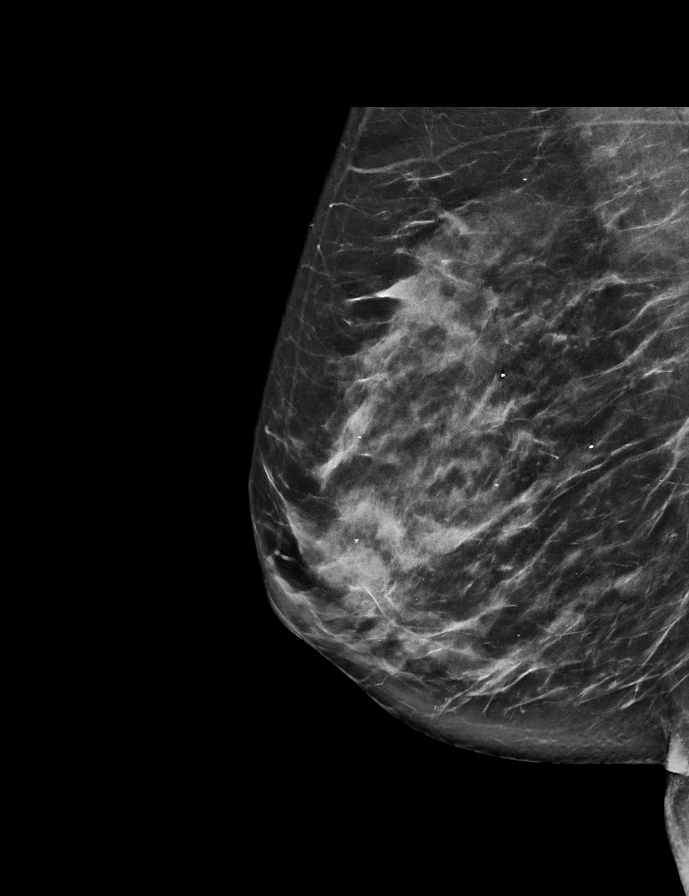

[R CC synth-2D]
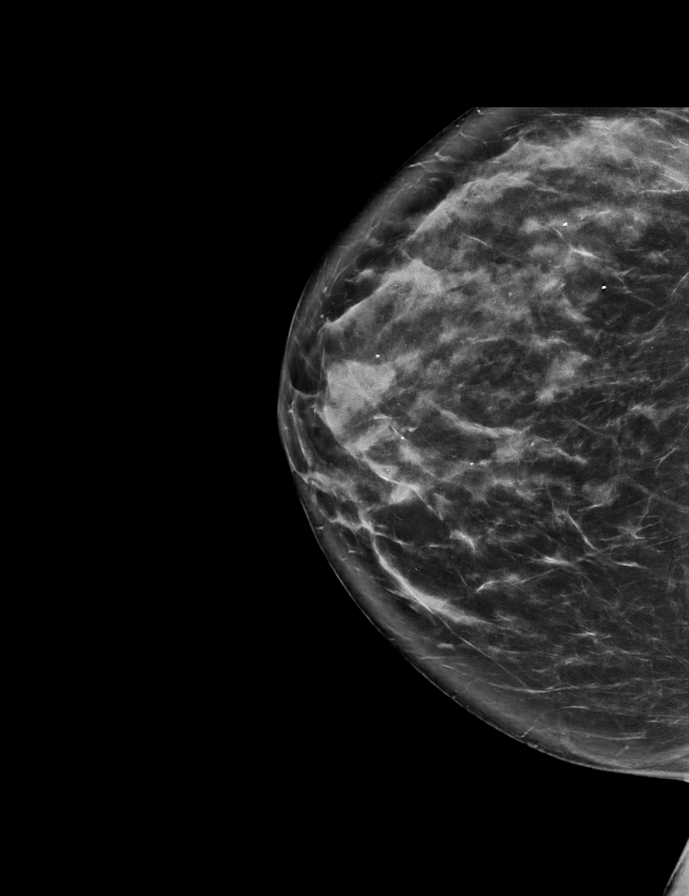

[L CC synth-2D]
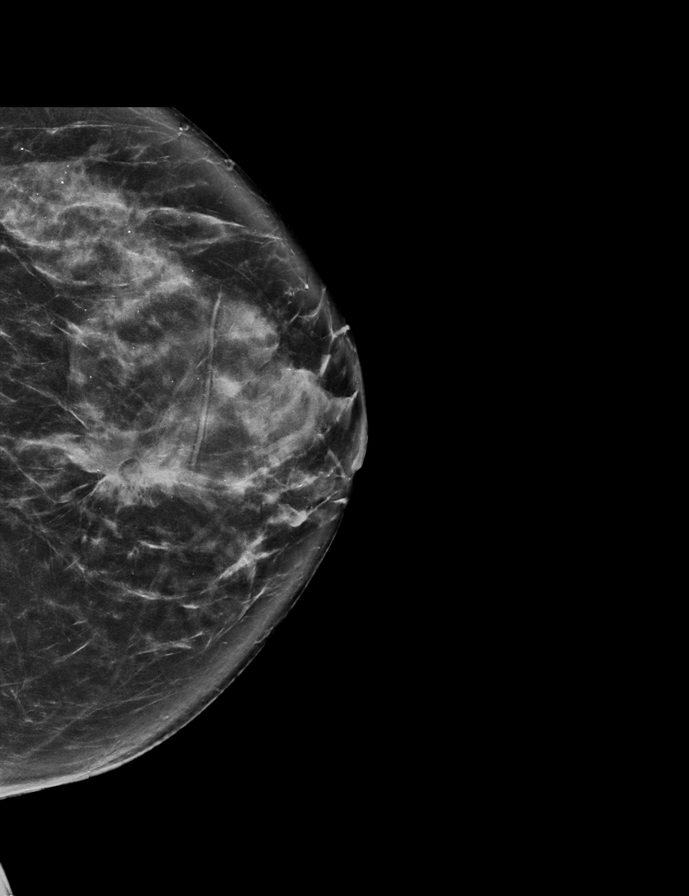

[L MLO tomo · 2 of 77 frames shown]
[frame 25/77]
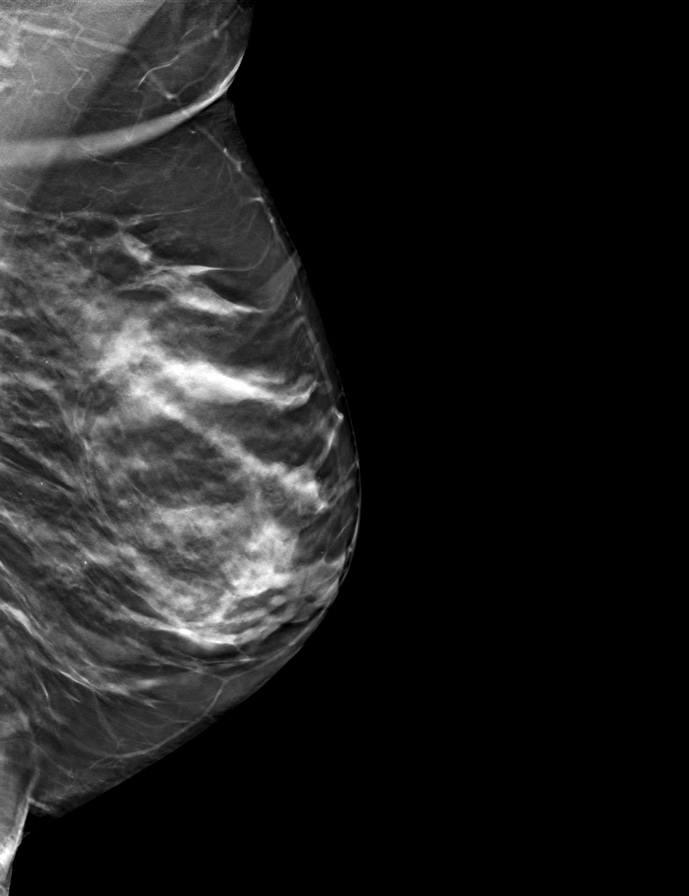
[frame 39/77]
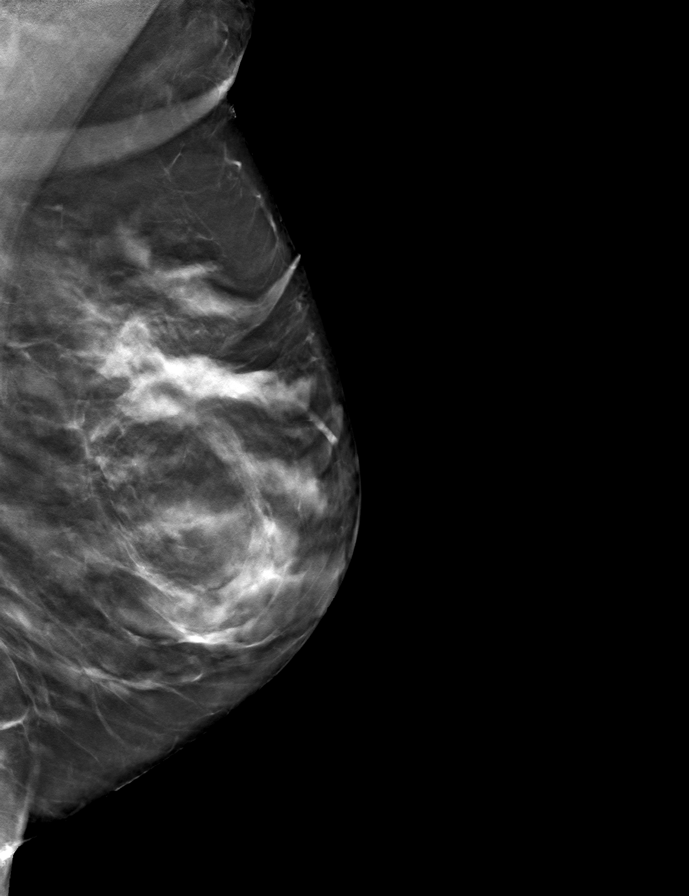

[R CC tomo · tomo slice 37/72.0]
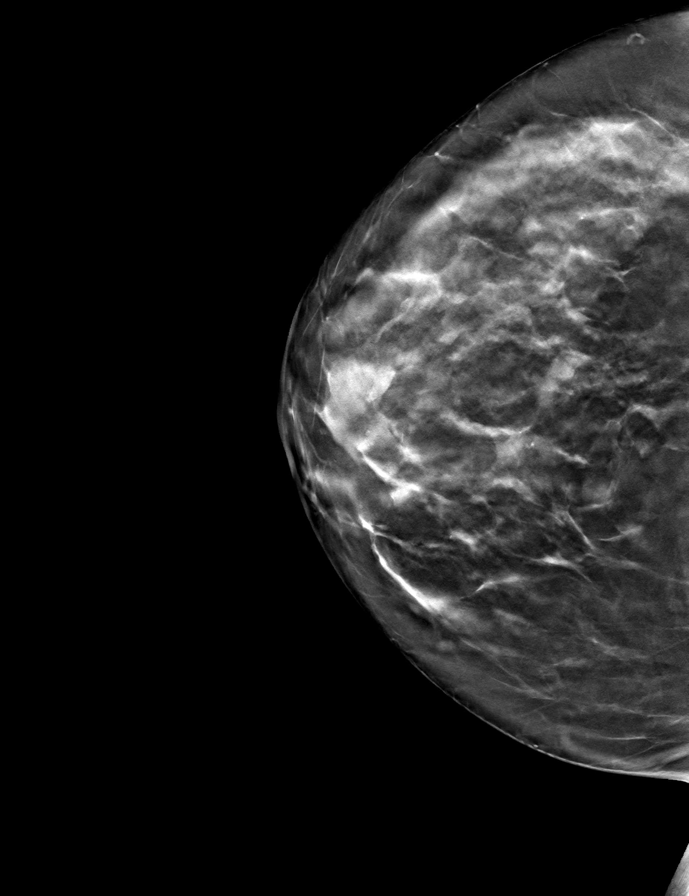

[R MLO tomo · tomo slice 37/74.0]
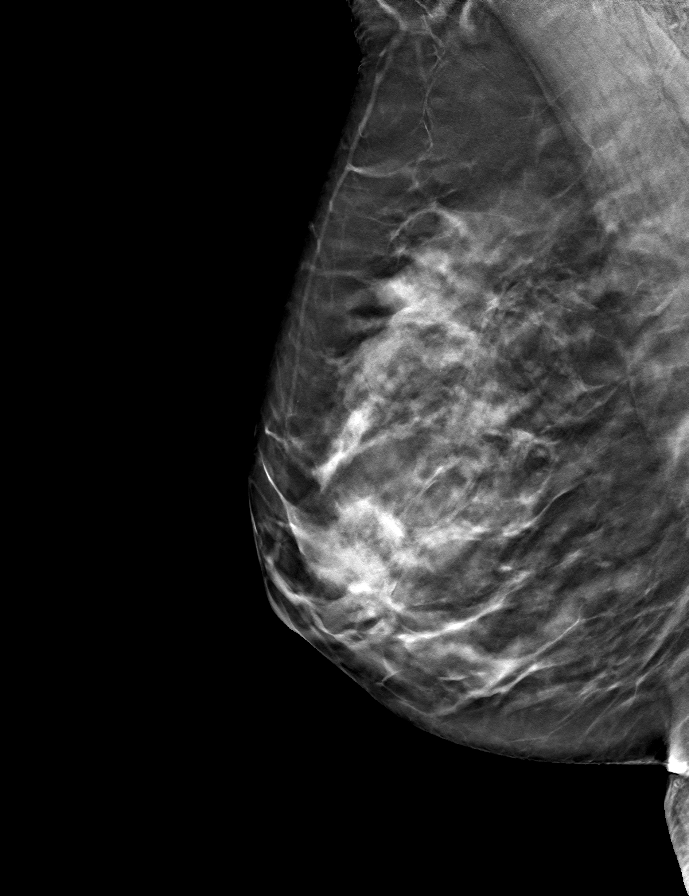

[L CC tomo · tomo slice 37/73.0]
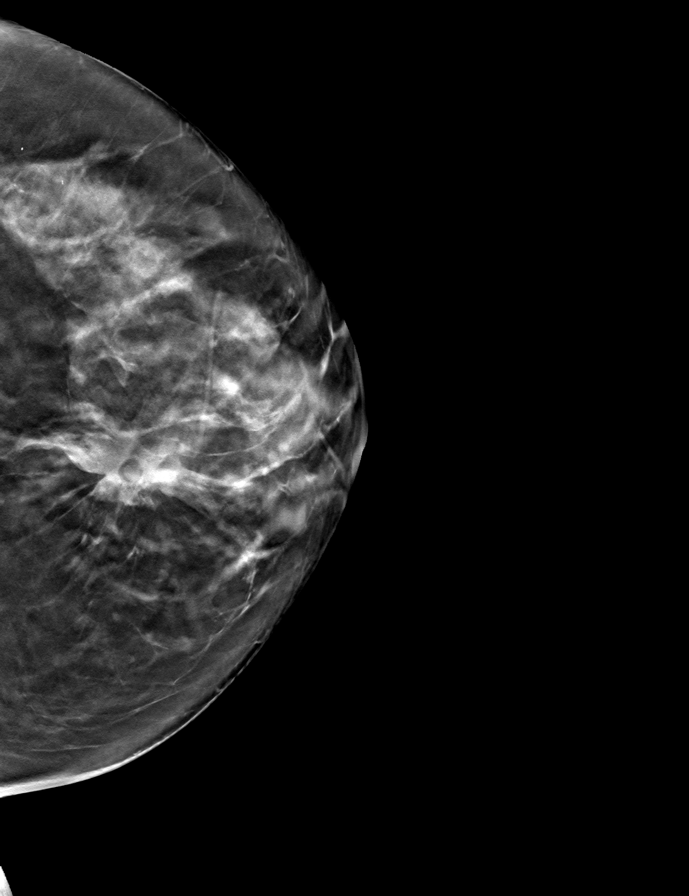

[9 of 24 positions shown; findings below may reference images not displayed]

ACR Breast Density Category c: The breast tissue is heterogeneously
dense, which may obscure small masses.
FINDINGS: There are no findings suspicious for malignancy.
IMPRESSION: No mammographic evidence of malignancy. A result letter of this
screening mammogram will be mailed directly to the patient.

RECOMMENDATION:
Screening mammogram in one year. (Code:[V2])

BI-RADS CATEGORY  1: Negative.

## 2020-09-05 NOTE — Progress Notes (Signed)
60 y.o. G40P2002 Married White or Caucasian female here for annual exam.  Has new job at [employer] at her [employer].  Denies vaginal bleeding.  Had screening MRI with abnormal finding.  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  Saw genetics.  Has VUS BARD1.  Lifetime risk is now ~30% for breast cancer because of radial scar.  Has some questions about Tamxoifen use.  Patient's last menstrual period was 01/23/2017.          Sexually active: Yes.    The current method of family planning is post menopausal status.    Exercising: Yes.     walking Smoker:  no  Health Maintenance: Pap:  09/04/2019 Negative History of abnormal Pap:  no MMG:  today Colonoscopy:  08/2018, follow up 3 year BMD:   12/2019, follow up 3 years possibly TDaP:  01/2020 Shingrix:   2020 Hep C testing: 08/2015 Screening Labs: dr. Jeryl Columbia   reports that she has never smoked. She has never used smokeless tobacco. She reports current alcohol use. She reports that she does not use drugs.  Past Medical History:  Diagnosis Date   Allergy    Anemia    Anxiety    Arthritis    Asthma    Depression    Family history of breast cancer    GERD (gastroesophageal reflux disease)    Hyperlipidemia    Pneumonia 1987   Pre-diabetes    Radial scar of breast    Reflux    Single cyst of left breast 02/01/12   Diagnostic mammogram and ultrasound - cyst at 2:30    Past Surgical History:  Procedure Laterality Date   BREAST LUMPECTOMY WITH RADIOACTIVE SEED LOCALIZATION Left 12/21/2019   Procedure: LEFT BREAST LUMPECTOMY WITH RADIOACTIVE SEED LOCALIZATION;  Surgeon: Erroll Luna, MD;  Location: [place];  Service: General;  Laterality: Left;   [place] (IUD) INSERTION  08/19/2015   Mirena   POLYPECTOMY     UPPER GASTROINTESTINAL ENDOSCOPY     WISDOM TOOTH EXTRACTION      Current Outpatient Medications  Medication Sig Dispense Refill    albuterol (PROAIR HFA) 108 (90 Base) MCG/ACT inhaler Inhale 2 puffs into the lungs every 4 (four) hours as needed for wheezing or shortness of breath. 1 Inhaler 3   azelastine (ASTELIN) 0.1 % nasal spray Place 2 sprays into both nostrils 2 (two) times daily.     budesonide-formoterol (SYMBICORT) 160-4.5 MCG/ACT inhaler Inhale 2 puffs into the lungs 2 (two) times daily. Reported on 05/21/2015 1 Inhaler 11   Cholecalciferol (VITAMIN D-3) 1000 units CAPS Take 1 capsule by mouth daily.     Coenzyme Q10 (CO Q 10 PO) Take by mouth.     CRESTOR 5 MG tablet Take 5 mg by mouth every evening.  12   EPINEPHrine (EPIPEN 2-PAK) 0.3 mg/0.3 mL IJ SOAJ injection Inject 0.3 mLs (0.3 mg total) into the muscle once. 2 Device 2   fluvoxaMINE (LUVOX) 100 MG tablet Take 100 mg by mouth at bedtime. Taking 144m daily     Multiple Vitamin (MULTIVITAMIN WITH MINERALS) TABS tablet Take 1 tablet by mouth daily.     pantoprazole (PROTONIX) 20 MG tablet Take 20 mg by mouth daily.     traZODone (DESYREL) 50 MG tablet Take 50 mg by mouth at bedtime.      No current facility-administered medications for this visit.    Family History  Problem Relation Age of Onset   Breast cancer Mother 7   Depression Mother    Anxiety disorder Mother    Osteoporosis Mother    Thyroid disease Mother        on synthroid   Depression Sister    Anxiety disorder Sister    Stroke Father        died 02/22/12   Dementia Father    Alzheimer's disease Paternal Aunt    Breast cancer Cousin        triple negative   Breast cancer Cousin        mom's cousin   Colon cancer Neg Hx    Colon polyps Neg Hx    Esophageal cancer Neg Hx    Rectal cancer Neg Hx    Stomach cancer Neg Hx     Review of Systems  All other systems reviewed and are negative.  Exam:   BP 120/79 (BP Location: Right Arm, Patient Position: Sitting, Cuff Size: Small)   Pulse 69   Ht 5' 4.5" (1.638 m)   Wt 144 lb 9.6 oz (65.6 kg)   LMP 01/23/2017   BMI 24.44 kg/m    Height: 5' 4.5" (163.8 cm)  General appearance: alert, cooperative and appears stated age Head: Normocephalic, without obvious abnormality, atraumatic Neck: no adenopathy, supple, symmetrical, trachea midline and thyroid normal to inspection and palpation Lungs: clear to auscultation bilaterally Breasts: normal appearance, no masses or tenderness Heart: regular rate and rhythm Abdomen: soft, non-tender; bowel sounds normal; no masses,  no organomegaly Extremities: extremities normal, atraumatic, no cyanosis or edema Skin: Skin color, texture, turgor normal. No rashes or lesions Lymph nodes: Cervical, supraclavicular, and axillary nodes normal. No abnormal inguinal nodes palpated Neurologic: Grossly normal   Pelvic: External genitalia:  no lesions              Urethra:  normal appearing urethra with no masses, tenderness or lesions              Bartholins and Skenes: normal                 Vagina: normal appearing vagina with normal color and no discharge, no lesions              Cervix: no lesions              Pap taken: No. Bimanual Exam:  Uterus:  about 8 weeks with nodularity on right, mobile, non tender              Adnexa: normal adnexa and no mass, fullness, tenderness               Rectovaginal: Confirms               Anus:  normal sphincter tone, no lesions  Chaperone, Octaviano Batty, CMA, was present for exam.  Assessment/Plan: 1. Well woman exam with routine gynecological exam - pap 09/04/2019.  Not indicated today. - MMG scheduled today - BMD done 12/2019 - colonoscopy due in 2023 - vaccines updated - lab work done with PCP  2. Postmenopausal - Off HRT  3. Increased risk of breast cancer, Family hx of breast cancer, Radial scar of breast - Ambulatory referral to [facility] for discussion of Tamoxifen use - Doing yearly breast MRI.  She and I have decided she will do this in October and get where [facility] and MRI are every 6 months.  4. Fibroids,  intramural - stable exam

## 2020-09-12 ENCOUNTER — Telehealth: Payer: Self-pay | Admitting: Hematology and Oncology

## 2020-09-12 NOTE — Telephone Encounter (Signed)
Received a new referral from Dr. Sabra Heck for the pt to be seen in the high risk clinic. Allison Soto has been scheduled to see Dr. Chryl Heck on 8/23 at 10am. Pt aware to arrive 15 minutes early.

## 2020-10-01 ENCOUNTER — Encounter: Payer: Self-pay | Admitting: Hematology and Oncology

## 2020-10-01 ENCOUNTER — Inpatient Hospital Stay
Payer: No Typology Code available for payment source | Attending: Hematology and Oncology | Admitting: Hematology and Oncology

## 2020-10-01 ENCOUNTER — Other Ambulatory Visit: Payer: Self-pay

## 2020-10-01 VITALS — BP 126/74 | HR 70 | Temp 98.3°F | Resp 18 | Ht 64.5 in | Wt 147.8 lb

## 2020-10-01 DIAGNOSIS — Z79899 Other long term (current) drug therapy: Secondary | ICD-10-CM | POA: Diagnosis not present

## 2020-10-01 DIAGNOSIS — Z1501 Genetic susceptibility to malignant neoplasm of breast: Secondary | ICD-10-CM | POA: Insufficient documentation

## 2020-10-01 DIAGNOSIS — Z9189 Other specified personal risk factors, not elsewhere classified: Secondary | ICD-10-CM | POA: Diagnosis not present

## 2020-10-01 DIAGNOSIS — Z7951 Long term (current) use of inhaled steroids: Secondary | ICD-10-CM | POA: Insufficient documentation

## 2020-10-01 DIAGNOSIS — Z800 Family history of malignant neoplasm of digestive organs: Secondary | ICD-10-CM | POA: Diagnosis not present

## 2020-10-01 DIAGNOSIS — K219 Gastro-esophageal reflux disease without esophagitis: Secondary | ICD-10-CM | POA: Insufficient documentation

## 2020-10-01 DIAGNOSIS — N62 Hypertrophy of breast: Secondary | ICD-10-CM | POA: Insufficient documentation

## 2020-10-01 DIAGNOSIS — E785 Hyperlipidemia, unspecified: Secondary | ICD-10-CM | POA: Insufficient documentation

## 2020-10-01 DIAGNOSIS — Z803 Family history of malignant neoplasm of breast: Secondary | ICD-10-CM | POA: Diagnosis not present

## 2020-10-01 MED ORDER — TAMOXIFEN CITRATE 20 MG PO TABS: 20.0000 mg | ORAL_TABLET | Freq: Every day | ORAL | 2 refills | Status: DC

## 2020-10-01 NOTE — Progress Notes (Signed)
[facility] CONSULT NOTE  Patient Care Team: Serita Grammes, MD as PCP - General (Family Medicine) Buford Dresser, MD as PCP - Cardiology (Cardiology)  CHIEF COMPLAINTS/PURPOSE OF CONSULTATION:  High risk for breast cancer.  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  1.We discussed her life time risk of breast cancer and 5 yr risk of breast cancer today which is approximately 29% and 3.9% respectively.  2. We discussed different risk reducing strategies for future breast cancer risk. We discussed chemoprevention and lifestyle modification. We discussed role of ongoing surveillance with mammograms versus MRIs.  We discussed her tamoxifen, mechanism of action, adverse effects of tamoxifen including but not limited to postmenopausal symptoms, increased risk of DVT/PE, cardiovascular events, increased risk of endometrial cancers.  The benefit from tamoxifen would be increase in bone density besides reduced risk of breast cancer.  She is willing to try tamoxifen..  Tamoxifen 20 mg daily dose has been prescribed and sent to the pharmacy of her choice. She will return to clinic in about 3 months for toxicity check on tamoxifen.  3. Lifestyle modification: We discussed different interventions exercise at least 5 days a week including both aerobic as well as weight-bearing exercises and strength training. He discussed dietary modification including increasing number of servings of fruit and vegetables as well as decreased in number of servings of meat. We discussed the importance of maintaining a good BMI and limiting alcohol intake.  4. Surveillance: Patient certainly would be a good candidate  for ongoing mammograms on a yearly basis along with annual MRIs.  We have discussed that MRI is a very sensitive tool for breast cancer screening however may sometimes lead to additional biopsies and long-term risk of gadolinium deposition is unknown at this time.  She is willing to continue with annual MRIs and mammograms.  5. Chemoprevention: As mentioned above, she will start tamoxifen in September.  6. Genetics: Genetic testing VUS in BARD1 gene, no clinical significance at this time.  7. Followup: Follow-up in mid December for tamoxifen toxicity check.  MRI breast ordered for February 2023.  HISTORY OF PRESENTING ILLNESS:   Allison Soto 60 y.o. female is here because of high risk of breast cancer.  Allison Soto is a delightful 60 year old female patient with past medical history significant for allergies, asthma, previous breast biopsy showing usual ductal hyperplasia, family history of breast cancer who was initially seen by our genetics team, had genetic testing which showed a VUS in [place] gene with no clinical significance at this time however has a lifetime risk of breast cancer greater than 20% and hence referred to high risk breast clinic.  She is here for an initial visit.  She mentions that she likes to be proactive and does what she can do to prevent breast cancer.  She does have family of history of breast cancer, mom had triple negative DCIS is what she called although she is not quite sure if this was invasive breast cancer in her 80s.  She also has an uncle who is daughter triple negative breast cancer.  No family history of ovarian or colon cancer.  She is very healthy at baseline.  No concerning review of systems.  Most recent mammogram done in July 2022 with  no concerns  REVIEW OF SYSTEMS:   Constitutional: Denies fevers, chills or abnormal night sweats Eyes: Denies blurriness of vision, double vision or watery eyes Ears, nose, mouth, throat, and face: Denies  mucositis or sore throat Respiratory: Denies cough, dyspnea or wheezes Cardiovascular: Denies palpitation, chest discomfort or lower extremity swelling Gastrointestinal:  Denies nausea, heartburn or change in bowel habits Skin: Denies abnormal skin rashes Lymphatics: Denies new lymphadenopathy or easy bruising Neurological:Denies numbness, tingling or new weaknesses Behavioral/Psych: Mood is stable, no new changes  All other systems were reviewed with the patient and are negative.  MEDICAL HISTORY:  Past Medical History:  Diagnosis Date   Allergy    Anemia    Anxiety    Arthritis    Asthma    Depression    Family history of breast cancer    GERD (gastroesophageal reflux disease)    Hyperlipidemia    Pneumonia 1987   Pre-diabetes    Radial scar of breast    Reflux    Single cyst of left breast 02/01/12   Diagnostic mammogram and ultrasound - cyst at 2:30    SURGICAL HISTORY: Past Surgical History:  Procedure Laterality Date   BREAST LUMPECTOMY WITH RADIOACTIVE SEED LOCALIZATION Left 12/21/2019   Procedure: LEFT BREAST LUMPECTOMY WITH RADIOACTIVE SEED LOCALIZATION;  Surgeon: Erroll Luna, MD;  Location: [place];  Service: General;  Laterality: Left;   [place] (IUD) INSERTION  08/19/2015   Mirena   POLYPECTOMY     UPPER GASTROINTESTINAL ENDOSCOPY     WISDOM TOOTH EXTRACTION      SOCIAL HISTORY: Social History   Socioeconomic History   Marital status: Married    Spouse name: Not on file   Number of children: Not on file   Years of education: Not on file   Highest education level: Not on file  Occupational History   Not on file  Tobacco Use   Smoking status: Never   Smokeless tobacco: Never  Vaping Use   Vaping Use: Never used  Substance and Sexual Activity   Alcohol use: Yes    Alcohol/week: 0.0 - 1.0 standard drinks   Drug use: No   Sexual activity: Yes    Partners: Male     Comment: Mirena Inserted 08/19/15  Other Topics Concern   Not on file  Social History Narrative   Not on file   Social Determinants of Health   Financial Resource Strain: Not on file  Food Insecurity: Not on file  Transportation Needs: Not on file  Physical Activity: Not on file  Stress: Not on file  Social Connections: Not on file  Intimate Partner Violence: Not on file    FAMILY HISTORY: Family History  Problem Relation Age of Onset   Breast cancer Mother 47   Depression Mother    Anxiety disorder Mother    Osteoporosis Mother    Thyroid disease Mother        on synthroid   Depression Sister    Anxiety disorder Sister    Stroke Father        died 2012-02-25   Dementia Father    Alzheimer's disease Paternal Aunt    Breast cancer Cousin        triple negative   Breast cancer Cousin        mom's cousin   Colon cancer Neg Hx    Colon polyps Neg Hx    Esophageal cancer  Neg Hx    Rectal cancer Neg Hx    Stomach cancer Neg Hx     ALLERGIES:  is allergic to shellfish allergy and penicillins.  MEDICATIONS:  Current Outpatient Medications  Medication Sig Dispense Refill   albuterol (PROAIR HFA) 108 (90 Base) MCG/ACT inhaler Inhale 2 puffs into the lungs every 4 (four) hours as needed for wheezing or shortness of breath. 1 Inhaler 3   azelastine (ASTELIN) 0.1 % nasal spray Place 2 sprays into both nostrils 2 (two) times daily.     budesonide-formoterol (SYMBICORT) 160-4.5 MCG/ACT inhaler Inhale 2 puffs into the lungs 2 (two) times daily. Reported on 05/21/2015 1 Inhaler 11   Cholecalciferol (VITAMIN D-3) 1000 units CAPS Take 1 capsule by mouth daily.     Coenzyme Q10 (CO Q 10 PO) Take by mouth.     CRESTOR 5 MG tablet Take 5 mg by mouth every evening.  12   EPINEPHrine (EPIPEN 2-PAK) 0.3 mg/0.3 mL IJ SOAJ injection Inject 0.3 mLs (0.3 mg total) into the muscle once. 2 Device 2   fluvoxaMINE (LUVOX) 100 MG tablet Take 100 mg by mouth at bedtime. Taking 17m daily      minoxidil (ROGAINE) 2 % external solution Apply topically.     Multiple Vitamin (MULTIVITAMIN WITH MINERALS) TABS tablet Take 1 tablet by mouth daily.     pantoprazole (PROTONIX) 20 MG tablet Take 20 mg by mouth daily.     traZODone (DESYREL) 50 MG tablet Take 50 mg by mouth at bedtime.      No current facility-administered medications for this visit.     PHYSICAL EXAMINATION:  ECOG PERFORMANCE STATUS: 0 - Asymptomatic  Vitals:   10/01/20 0959  BP: 126/74  Pulse: 70  Resp: 18  Temp: 98.3 F (36.8 C)  SpO2: 97%   Filed Weights   10/01/20 0959  Weight: 147 lb 12.8 oz (67 kg)    GENERAL:alert, no distress and comfortable Breast exam deferred, recently had breast exam in July according to patient.60  LABORATORY DATA:  I have reviewed the data as listed Lab Results  Component Value Date   WBC 5.4 12/18/2019   HGB 15.6 (H) 12/18/2019   HCT 47.5 (H) 12/18/2019   MCV 97.3 12/18/2019   PLT 247 12/18/2019     Chemistry      Component Value Date/Time   NA 140 12/18/2019 0930   K 5.0 12/18/2019 0930   CL 103 12/18/2019 0930   CO2 29 12/18/2019 0930   BUN 12 12/18/2019 0930   CREATININE 0.78 12/18/2019 0930      Component Value Date/Time   CALCIUM 9.4 12/18/2019 0930   ALKPHOS 44 12/18/2019 0930   AST 29 12/18/2019 0930   ALT 32 12/18/2019 0930   BILITOT 0.3 12/18/2019 0930       RADIOGRAPHIC STUDIES: I have personally reviewed the radiological images as listed and agreed with the findings in the report. MM 3D SCREEN BREAST BILATERAL  Result Date: 09/06/2020 CLINICAL DATA:  Screening. EXAM: DIGITAL SCREENING BILATERAL MAMMOGRAM WITH TOMOSYNTHESIS AND CAD TECHNIQUE: Bilateral screening digital craniocaudal and mediolateral oblique mammograms were obtained. Bilateral screening digital breast tomosynthesis was performed. The images were evaluated with computer-aided detection. COMPARISON:  Previous exam(s). ACR Breast Density Category c: The breast tissue is  heterogeneously dense, which may obscure small masses. FINDINGS: There are no findings suspicious for malignancy. IMPRESSION: No mammographic evidence of malignancy. A result letter of this screening mammogram will be mailed directly to the patient. RECOMMENDATION: Screening mammogram  in one year. (Code:SM-B-01Y) BI-RADS CATEGORY  1: Negative. Electronically Signed   By: Audie Pinto M.D.   On: 09/06/2020 14:05    All questions were answered. The patient knows to call the clinic with any problems, questions or concerns. I spent 60 minutes in the care of this patient including H and P, review of records, counseling and coordination of care. Have reviewed role of tamoxifen chemoprevention, role of MRIs, other interventions which can aim at breast cancer risk reduction, follow-up and surveillance recommendations.    Benay Pike, MD 10/01/2020 10:19 AM

## 2020-10-24 ENCOUNTER — Encounter: Payer: Self-pay | Admitting: Hematology and Oncology

## 2021-01-23 ENCOUNTER — Inpatient Hospital Stay: Payer: No Typology Code available for payment source | Admitting: Hematology and Oncology

## 2021-02-20 ENCOUNTER — Other Ambulatory Visit: Payer: Self-pay | Admitting: *Deleted

## 2021-02-20 ENCOUNTER — Inpatient Hospital Stay
Payer: No Typology Code available for payment source | Attending: Hematology and Oncology | Admitting: Hematology and Oncology

## 2021-02-20 ENCOUNTER — Encounter: Payer: Self-pay | Admitting: Hematology and Oncology

## 2021-02-20 DIAGNOSIS — K219 Gastro-esophageal reflux disease without esophagitis: Secondary | ICD-10-CM | POA: Diagnosis not present

## 2021-02-20 DIAGNOSIS — M810 Age-related osteoporosis without current pathological fracture: Secondary | ICD-10-CM | POA: Insufficient documentation

## 2021-02-20 DIAGNOSIS — E785 Hyperlipidemia, unspecified: Secondary | ICD-10-CM | POA: Insufficient documentation

## 2021-02-20 DIAGNOSIS — Z79899 Other long term (current) drug therapy: Secondary | ICD-10-CM | POA: Diagnosis not present

## 2021-02-20 DIAGNOSIS — Z7951 Long term (current) use of inhaled steroids: Secondary | ICD-10-CM | POA: Insufficient documentation

## 2021-02-20 DIAGNOSIS — Z9189 Other specified personal risk factors, not elsewhere classified: Secondary | ICD-10-CM | POA: Diagnosis not present

## 2021-02-20 DIAGNOSIS — Z79810 Long term (current) use of selective estrogen receptor modulators (SERMs): Secondary | ICD-10-CM | POA: Diagnosis not present

## 2021-02-20 DIAGNOSIS — Z298 Encounter for other specified prophylactic measures: Secondary | ICD-10-CM | POA: Diagnosis not present

## 2021-02-20 MED ORDER — TAMOXIFEN CITRATE 20 MG PO TABS: 20.0000 mg | ORAL_TABLET | Freq: Every day | ORAL | 2 refills | Status: DC

## 2021-02-20 NOTE — Progress Notes (Signed)
[facility] CONSULT NOTE  Patient Care Team: Serita Grammes, MD as PCP - General (Family Medicine) Buford Dresser, MD as PCP - Cardiology (Cardiology)  CHIEF COMPLAINTS/PURPOSE OF CONSULTATION:  High risk for breast cancer.  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 She is here for tamoxifen toxicity check via telehealth. She has started this about 2 weeks ago and has been tolerating it really well.  No change in hot flashes.  No vaginal bleeding, change in breathing or lower extremity swelling.  She was interested in alternating mammograms and MRIs for surveillance, next MRI due in February 2023.  She understands the importance of lifestyle modification including increased consumption of vegetables and regular exercise. Physical examination deferred, telehealth visit.  At this time since patient is tolerating the medication very well, she will return to clinic in 6 months.  We have discussed about considering low-dose tamoxifen if she has difficulty tolerating current dose.  HISTORY OF PRESENTING ILLNESS:   Gurpreet Togo 61 y.o. female is here because of high risk of breast cancer.  Ms. 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  She is here for an initial visit.  Since  last visit, she has started taking tamoxifen for breast cancer prevention about 2 weeks ago.  She has not noticed any change in her hot flashes.  No other adverse effects reported.  She has been tolerating it very well.  MRI breast has not been scheduled yet. Rest of the pertinent 10 point ROS reviewed and negative.   MEDICAL HISTORY:  Past Medical History:  Diagnosis Date   Allergy    Anemia    Anxiety    Arthritis    Asthma    Depression    Family history of breast cancer    GERD (gastroesophageal reflux disease)    Hyperlipidemia    Pneumonia 1987   Pre-diabetes    Radial scar of breast    Reflux    Single cyst of left breast 02/01/12   Diagnostic mammogram and ultrasound - cyst at 2:30    SURGICAL HISTORY: Past Surgical History:  Procedure Laterality Date   BREAST LUMPECTOMY WITH RADIOACTIVE SEED LOCALIZATION Left 12/21/2019   Procedure: LEFT BREAST LUMPECTOMY WITH RADIOACTIVE SEED LOCALIZATION;  Surgeon: Erroll Luna, MD;  Location: [facility];  Service: General;  Laterality: Left;   [place] (IUD) INSERTION  08/19/2015   Mirena   POLYPECTOMY     UPPER GASTROINTESTINAL ENDOSCOPY     WISDOM TOOTH EXTRACTION      SOCIAL HISTORY: Social History   Socioeconomic History   Marital status: Married    Spouse name: Not on file   Number of children: Not on file   Years of education: Not on file   Highest education level: Not on file  Occupational History  Not on file  Tobacco Use   Smoking status: Never   Smokeless tobacco: Never  Vaping Use   Vaping Use: Never used  Substance and Sexual Activity   Alcohol use: Yes    Alcohol/week: 0.0 - 1.0 standard drinks   Drug use: No   Sexual activity: Yes    Partners: Male    Comment: Mirena Inserted 08/19/15  Other Topics Concern   Not on file  Social History Narrative   Not on file   Social Determinants of Health   Financial Resource  Strain: Not on file  Food Insecurity: Not on file  Transportation Needs: Not on file  Physical Activity: Not on file  Stress: Not on file  Social Connections: Not on file  Intimate Partner Violence: Not on file    FAMILY HISTORY: Family History  Problem Relation Age of Onset   Breast cancer Mother 40   Depression Mother    Anxiety disorder Mother    Osteoporosis Mother    Thyroid disease Mother        on synthroid   Depression Sister    Anxiety disorder Sister    Stroke Father        died Mar 07, 2012   Dementia Father    Alzheimer's disease Paternal Aunt    Breast cancer Cousin        triple negative   Breast cancer Cousin        mom's cousin   Colon cancer Neg Hx    Colon polyps Neg Hx    Esophageal cancer Neg Hx    Rectal cancer Neg Hx    Stomach cancer Neg Hx     ALLERGIES:  is allergic to shellfish allergy and penicillins.  MEDICATIONS:  Current Outpatient Medications  Medication Sig Dispense Refill   spironolactone (ALDACTONE) 25 MG tablet Take 25 mg by mouth in the morning and at bedtime.     albuterol (PROAIR HFA) 108 (90 Base) MCG/ACT inhaler Inhale 2 puffs into the lungs every 4 (four) hours as needed for wheezing or shortness of breath. 1 Inhaler 3   azelastine (ASTELIN) 0.1 % nasal spray Place 2 sprays into both nostrils 2 (two) times daily.     budesonide-formoterol (SYMBICORT) 160-4.5 MCG/ACT inhaler Inhale 2 puffs into the lungs 2 (two) times daily. Reported on 05/21/2015 1 Inhaler 11   Cholecalciferol (VITAMIN D-3) 1000 units CAPS Take 1 capsule by mouth daily.     Coenzyme Q10 (CO Q 10 PO) Take by mouth.     CRESTOR 5 MG tablet Take 5 mg by mouth every evening.  12   EPINEPHrine (EPIPEN 2-PAK) 0.3 mg/0.3 mL IJ SOAJ injection Inject 0.3 mLs (0.3 mg total) into the muscle once. 2 Device 2   fluvoxaMINE (LUVOX) 100 MG tablet Take 100 mg by mouth at bedtime. Taking 110m daily     minoxidil (ROGAINE) 2 % external solution Apply topically.     Multiple Vitamin  (MULTIVITAMIN WITH MINERALS) TABS tablet Take 1 tablet by mouth daily.     pantoprazole (PROTONIX) 20 MG tablet Take 20 mg by mouth daily.     tamoxifen (NOLVADEX) 20 MG tablet Take 1 tablet (20 mg total) by mouth daily. 30 tablet 2   traZODone (DESYREL) 50 MG tablet Take 50 mg by mouth at bedtime.      No current facility-administered medications for this visit.     PHYSICAL EXAMINATION:  ECOG PERFORMANCE STATUS: 0 - Asymptomatic  Vital signs and physical examination not done, telehealth visit  LMP 01/23/2017    GENERAL:alert, no distress and comfortable  LABORATORY DATA:  I have reviewed the data as listed Lab Results  Component Value Date   WBC 5.4 12/18/2019   HGB 15.6 (H) 12/18/2019   HCT 47.5 (H) 12/18/2019   MCV 97.3 12/18/2019   PLT 247 12/18/2019     Chemistry      Component Value Date/Time   NA 140 12/18/2019 0930   K 5.0 12/18/2019 0930   CL 103 12/18/2019 0930   CO2 29 12/18/2019 0930   BUN 12 12/18/2019 0930   CREATININE 0.78 12/18/2019 0930      Component Value Date/Time   CALCIUM 9.4 12/18/2019 0930   ALKPHOS 44 12/18/2019 0930   AST 29 12/18/2019 0930   ALT 32 12/18/2019 0930   BILITOT 0.3 12/18/2019 0930       RADIOGRAPHIC STUDIES: I have personally reviewed the radiological images as listed and agreed with the findings in the report. No results found.  I connected with  [name] on 02/20/21 by a video enabled telemedicine application and verified that I am speaking with the correct person using two identifiers.   I discussed the limitations of evaluation and management by telemedicine. The patient expressed understanding and agreed to proceed.   All questions were answered. The patient knows to call the clinic with any problems, questions or concerns. I spent 20 minutes in the care of this patient including history, review of records, counseling about role of low-dose tamoxifen, surveillance with MRIs and mammograms and  documentation.  Benay Pike, MD 02/20/2021 5:16 PM

## 2021-03-25 ENCOUNTER — Encounter (HOSPITAL_COMMUNITY): Payer: Self-pay

## 2021-04-03 ENCOUNTER — Telehealth (HOSPITAL_BASED_OUTPATIENT_CLINIC_OR_DEPARTMENT_OTHER): Payer: Self-pay | Admitting: *Deleted

## 2021-04-03 ENCOUNTER — Other Ambulatory Visit (HOSPITAL_BASED_OUTPATIENT_CLINIC_OR_DEPARTMENT_OTHER): Payer: Self-pay | Admitting: Obstetrics & Gynecology

## 2021-04-03 ENCOUNTER — Ambulatory Visit: Payer: No Typology Code available for payment source | Admitting: Hematology and Oncology

## 2021-04-03 DIAGNOSIS — F418 Other specified anxiety disorders: Secondary | ICD-10-CM

## 2021-04-03 MED ORDER — LORAZEPAM 1 MG PO TABS: ORAL_TABLET | ORAL | 0 refills | Status: DC

## 2021-04-03 NOTE — Telephone Encounter (Signed)
Pt called stating that she has an upcoming breast MRI on 04/08/21. She reports having claustrophobia during these procedures. She is asking if Dr. Sabra Heck would send her in the prescription that she has sent in the past to help with the claustrophobia. She uses [facility]. Advised that I would send her request to Dr. Sabra Heck.

## 2021-04-08 ENCOUNTER — Other Ambulatory Visit: Payer: No Typology Code available for payment source

## 2021-04-21 ENCOUNTER — Ambulatory Visit
Admission: RE | Admit: 2021-04-21 | Discharge: 2021-04-21 | Disposition: A | Discharge status: Home / Self Care | Payer: No Typology Code available for payment source | Source: Ambulatory Visit | Attending: Hematology and Oncology | Admitting: Hematology and Oncology

## 2021-04-21 DIAGNOSIS — Z9189 Other specified personal risk factors, not elsewhere classified: Secondary | ICD-10-CM

## 2021-04-21 IMAGING — MR MR BREAST BILAT WO/W CM
8 of 12 series · 32 of 48 positions shown · IV contrast (7 ml gadavist)
Comparison: [DATE] screening mammogram, MRI [DATE] and
[DATE]

CLINICAL DATA: Breast cancer screening. Family history of breast
cancer. Lifetime risk of breast cancer is greater than 30% by [employer] model. Patient's mother was diagnosed with postmenopausal
breast cancer. Patient's maternal cousin was diagnosed with
premenopausal breast cancer at age 50. The patient had MR guided
core biopsy [DATE] of the UPPER INNER QUADRANT LEFT breast
showing complex sclerosing lesion. Benign radial scar was confirmed
on excision.

EXAM:
BILATERAL BREAST MRI WITH AND WITHOUT CONTRAST
TECHNIQUE: Multiplanar, multisequence MR images of both breasts were obtained
prior to and following the intravenous administration of 7 ml of
Gadavist

[Series 2: t2_tirm_tra ipat (a-p) · axial · 3.0mm · 0.70mm/px · 1 of 55 slices shown]
[im 1/55]
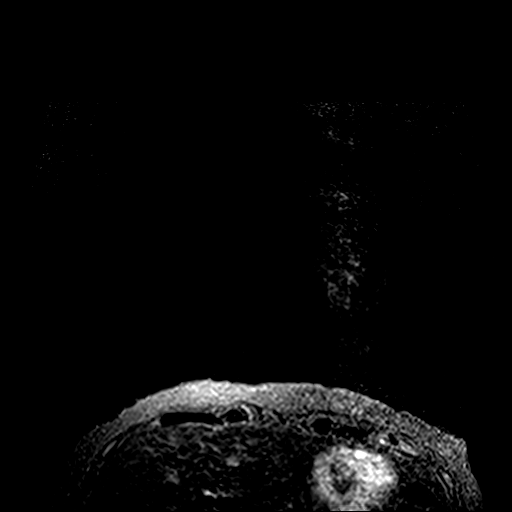

[Series 3: fl3d pre-cm no · axial · non-contrast · 1.2mm · 0.89mm/px · z∈[-104,+67]mm · 5 of 144 slices shown]
[im 1/144]
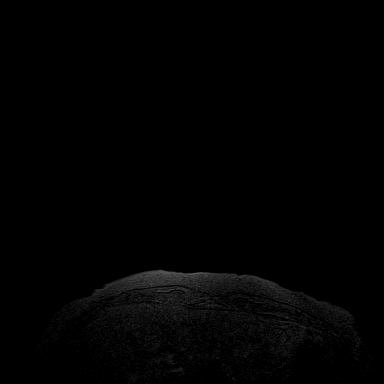
[im 36/144]
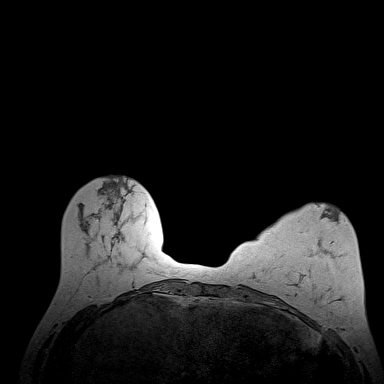
[im 72/144]
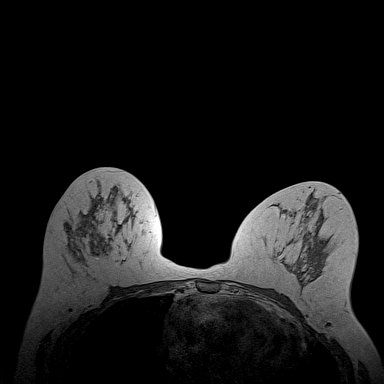
[im 108/144]
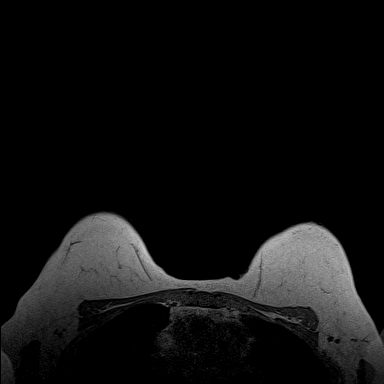
[im 144/144]
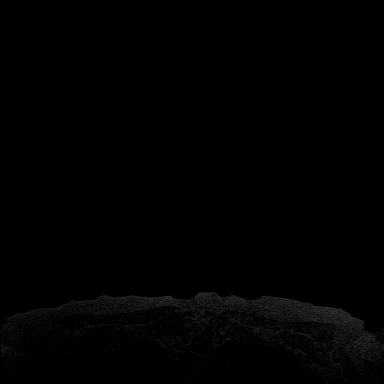

[Series 4: fl3d pre-cm · axial · non-contrast · 1.2mm · 0.89mm/px · z∈[-110,+61]mm · 5 of 144 slices shown]
[im 1/144]
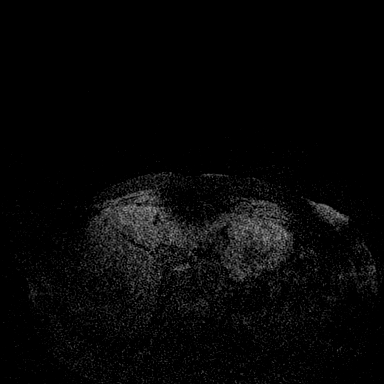
[im 36/144]
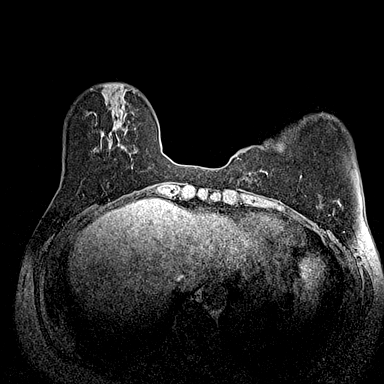
[im 72/144]
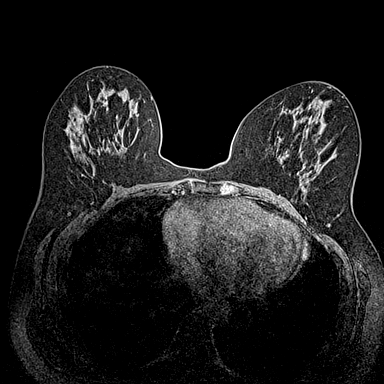
[im 108/144]
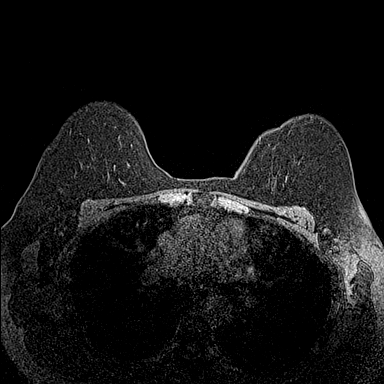
[im 144/144]
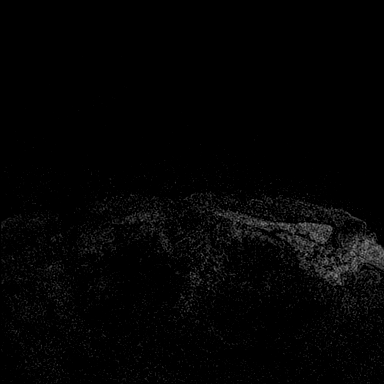

[Series 5: fl3d post-cm 20 · axial · 1.2mm · 0.89mm/px · z∈[-110,+61]mm · 5 of 144 slices shown (1 of 3)]
[im 1/144]
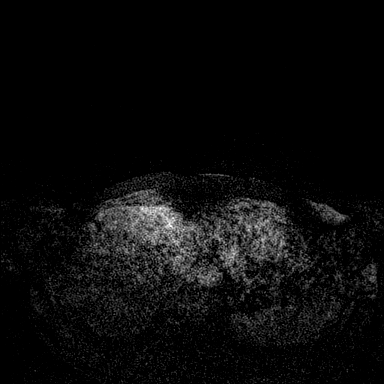
[im 36/144]
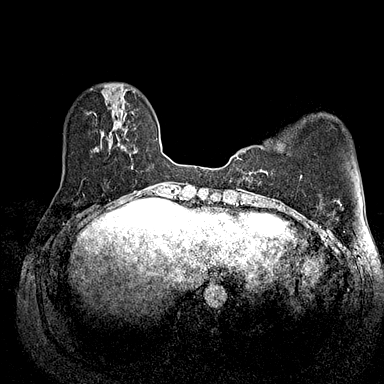
[im 72/144]
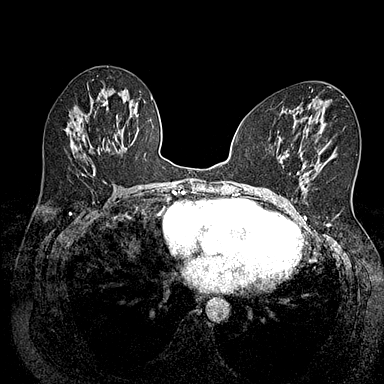
[im 108/144]
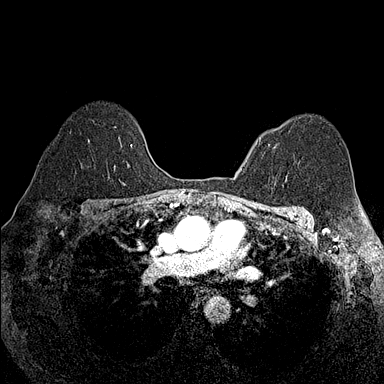
[im 144/144]
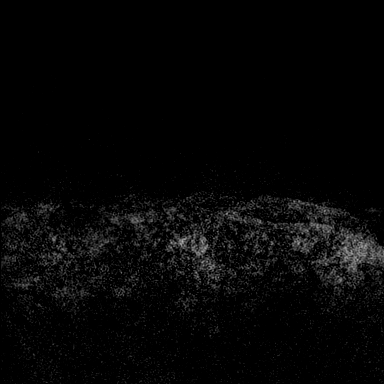

[Series 6: fl3d post-cm 20 · axial · 1.2mm · 0.89mm/px · z∈[-110,+61]mm · 5 of 144 slices shown (2 of 3)]
[im 1/144]
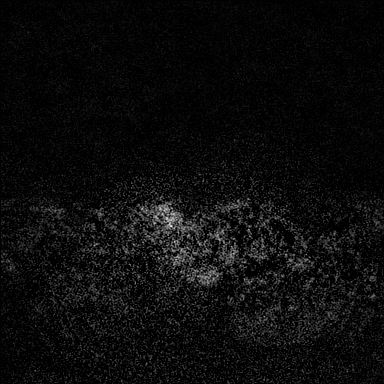
[im 36/144]
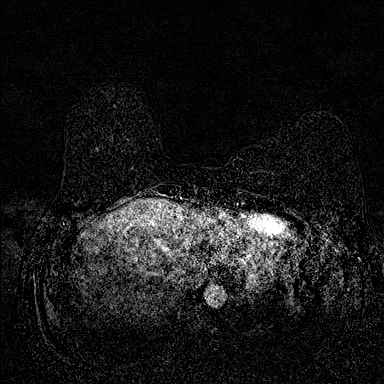
[im 72/144]
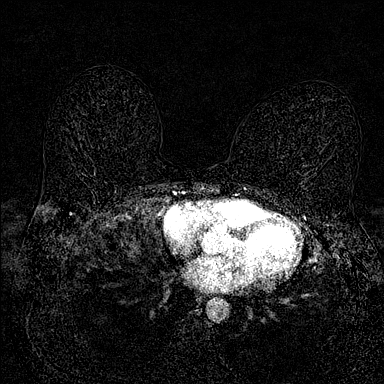
[im 108/144]
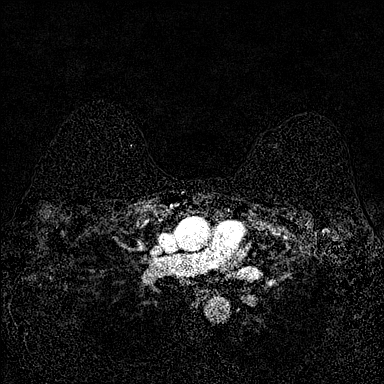
[im 144/144]
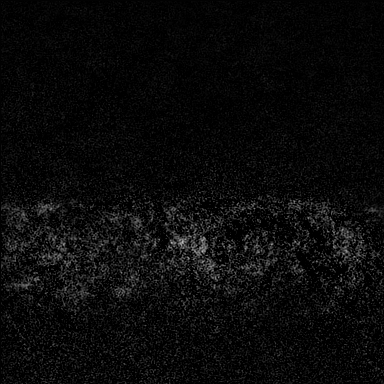

[Series 7: fl3d post-cm 20 · axial · 172.8mm · 0.89mm/px · 1 of 1 slices shown (3 of 3)]
[im 1/1]
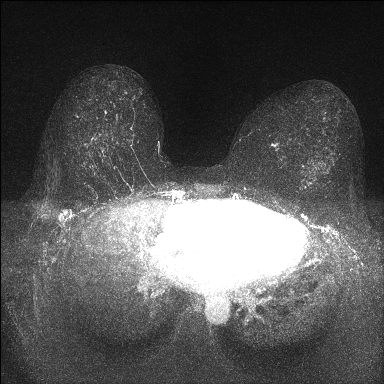

[Series 8: fl3d post-cm 3 · axial · 1.2mm · 0.89mm/px · z∈[-110,+61]mm · 6 of 144 slices shown (1 of 2)]
[im 1/144]
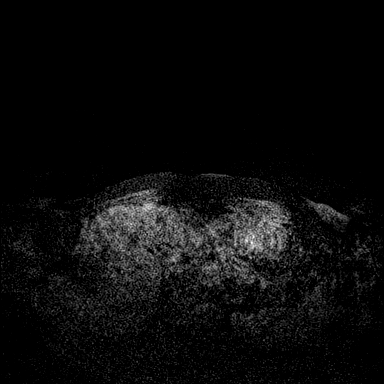
[im 29/144]
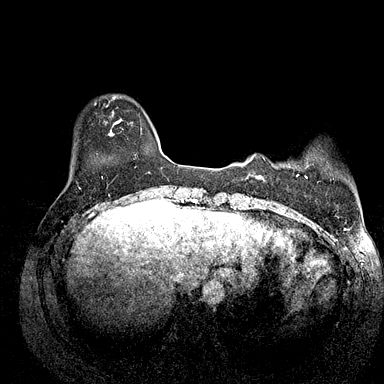
[im 58/144]
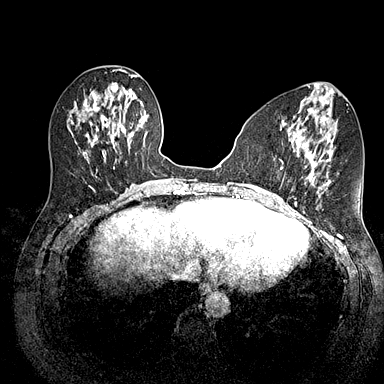
[im 86/144]
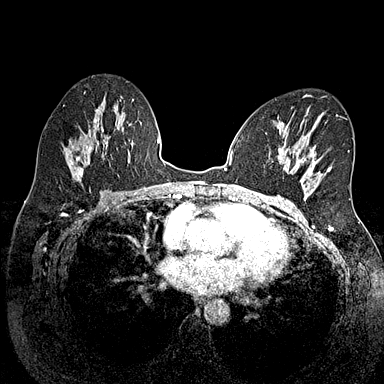
[im 115/144]
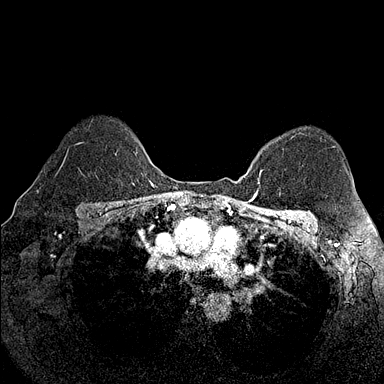
[im 144/144]
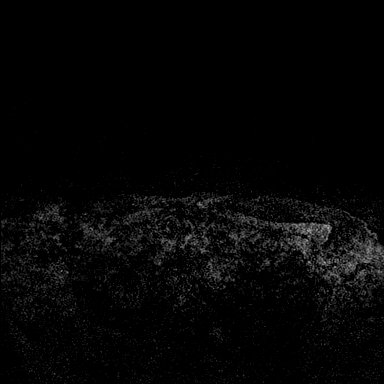

[Series 9: fl3d post-cm 3 · axial · 1.2mm · 0.89mm/px · z∈[-110,-8]mm · 4 of 144 slices shown (2 of 2)]
[im 1/144]
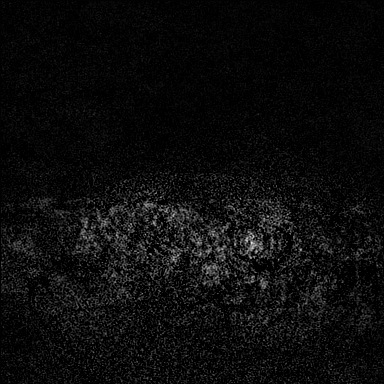
[im 29/144]
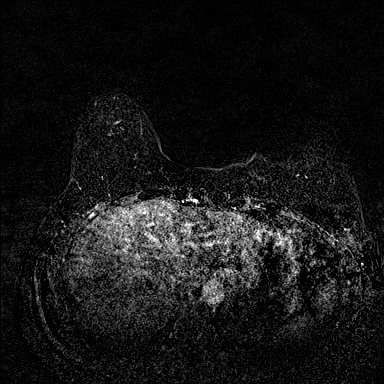
[im 58/144]
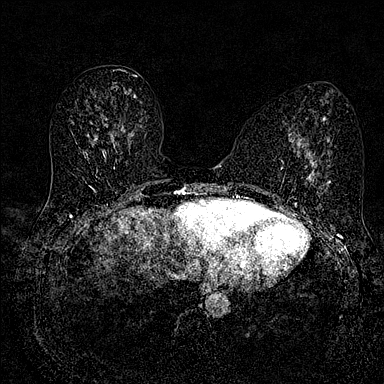
[im 86/144]
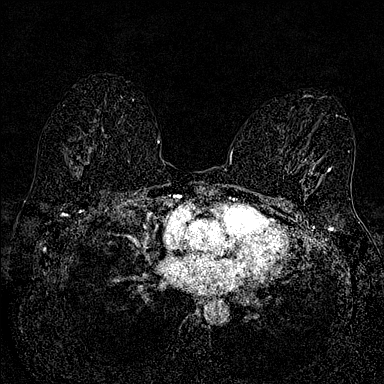

[32 of 48 positions shown; findings below may reference images not displayed]

Three-dimensional MR images were rendered by post-processing of the
original MR data on an independent workstation. The
three-dimensional MR images were interpreted, and findings are
reported in the following complete MRI report for this study. Three
dimensional images were evaluated at the independent interpreting
workstation using the DynaCAD thin client.
FINDINGS: Breast composition: c. Heterogeneous fibroglandular tissue.

Background parenchymal enhancement: Minimal

Right breast: No mass or abnormal enhancement.

Left breast: Focal enhancement is identified in the UPPER INNER
QUADRANT of the LEFT breast, in the area of previous excision.
Findings are consistent with benign fat necrosis. No suspicious
abnormality identified in the LEFT breast.

Lymph nodes: No abnormal appearing lymph nodes.

Ancillary findings:  None.
IMPRESSION: No MRI evidence for malignancy.

RECOMMENDATION:
Screening mammogram in [DATE].

Based on the recommendations of the American Cancer Society, annual
screening MRI is suggested in addition to annual mammography if the
patient has an estimated lifetime risk of developing breast cancer
which is greater than 20%.

BI-RADS CATEGORY  2: Benign.

## 2021-04-21 MED ORDER — GADOBUTROL 1 MMOL/ML IV SOLN
7.0000 mL | Freq: Once | INTRAVENOUS | Status: AC | PRN
  Administered 2021-04-21: 7 mL via INTRAVENOUS

## 2021-04-22 ENCOUNTER — Other Ambulatory Visit: Payer: Self-pay | Admitting: Hematology and Oncology

## 2021-04-22 DIAGNOSIS — Z9189 Other specified personal risk factors, not elsewhere classified: Secondary | ICD-10-CM

## 2021-04-22 NOTE — Progress Notes (Signed)
M

## 2021-05-21 ENCOUNTER — Ambulatory Visit (INDEPENDENT_AMBULATORY_CARE_PROVIDER_SITE_OTHER): Payer: No Typology Code available for payment source | Admitting: Allergy and Immunology

## 2021-05-21 ENCOUNTER — Encounter: Payer: Self-pay | Admitting: Allergy and Immunology

## 2021-05-21 VITALS — BP 124/82 | HR 82 | Resp 16 | Ht 64.0 in | Wt 147.8 lb

## 2021-05-21 DIAGNOSIS — J4540 Moderate persistent asthma, uncomplicated: Secondary | ICD-10-CM

## 2021-05-21 DIAGNOSIS — J3089 Other allergic rhinitis: Secondary | ICD-10-CM | POA: Diagnosis not present

## 2021-05-21 DIAGNOSIS — K219 Gastro-esophageal reflux disease without esophagitis: Secondary | ICD-10-CM

## 2021-05-21 MED ORDER — CARBINOXAMINE MALEATE 4 MG PO TABS: ORAL_TABLET | ORAL | 2 refills | Status: DC

## 2021-05-21 MED ORDER — PANTOPRAZOLE SODIUM 40 MG PO TBEC: 40.0000 mg | DELAYED_RELEASE_TABLET | Freq: Every day | ORAL | 2 refills | Status: DC

## 2021-05-21 MED ORDER — FAMOTIDINE 40 MG PO TABS: 40.0000 mg | ORAL_TABLET | Freq: Every evening | ORAL | 3 refills | Status: DC

## 2021-05-21 MED ORDER — BREZTRI AEROSPHERE 160-9-4.8 MCG/ACT IN AERO: 2.0000 | INHALATION_SPRAY | Freq: Two times a day (BID) | RESPIRATORY_TRACT | 2 refills | Status: DC

## 2021-05-21 MED ORDER — RYALTRIS 665-25 MCG/ACT NA SUSP: NASAL | 5 refills | Status: DC

## 2021-05-21 NOTE — Patient Instructions (Addendum)
?  1.  Treat and prevent inflammation: ? ?A. Breztri - 2 inhalations 2 times per day (empty lungs) ?B. Ryaltris - 2 sprays each nostril 2 times per day (specialty pharmacy) ? ?2.  Treat and prevent reflux/LPR: ? ?A. Pantoprazole 40 mg - 1 tablet 2 times per day ?B. Famotidine 40 mg - 1 tablet in PM ? ?3.  If needed: ? ?A. Albuterol HFA - 2 inhalations every 4-6 hours ?B. Mucinex DM - 2 times per day ?C. Carbinoxamine 4 mg - 1-2 tablets 1-2 times per day ?D. Epi-pen ? ?4.  Return to clinic in 8 weeks or earlier if problem ?

## 2021-05-21 NOTE — Progress Notes (Signed)
?[facility] ? ? ?Dear Dr. Jeryl Columbia, ? ?Thank you for referring Allison Soto to the [facility] on 05/21/2021.  ? ?Below is a summation of this patient's evaluation and recommendations. ? ?Thank you for your referral. I will keep you informed about this patient's response to treatment.  ? ?If you have any questions please do not hesitate to contact me.  ? ?Sincerely, ? ?Jiles Prows, MD ?Allergy / Immunology ?[facility] [place] ? ? ?______________________________________________________________________ ? ? ? ?NEW PATIENT NOTE ? ?Referring Provider: Serita Grammes, MD ?Primary Provider: Serita Grammes, MD ?Date of office visit: 05/21/2021 ?   ?Subjective:  ? ?Chief Complaint:  Allison Soto (DOB: 03/15/1960) is a 61 y.o. female who presents to the clinic on 05/21/2021 with a chief complaint of Cough ?.    ? ?HPI: Allison Soto presents to this clinic in evaluation of persistent respiratory tract symptoms.  I had seen Allison Soto in this clinic in 2017 for an issue with respiratory tract inflammation and LPR. ? ?She has really done yet well for years while using Symbicort on a pretty consistent basis and using pantoprazole at relatively low-dose on a consistent basis.  It is only when she changed her environment recently that she started to develop respiratory problems.  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  Even in the face of receiving 2 systemic steroids and 2 antibiotic she has been having a prolonged cough that does not resolve in conjunction with lots of postnasal drip.  Her cough is associated with Micturation and gagging and retching.  She does not have any significant upper airway symptoms at this point in time.  She does not have any nasal congestion  or anosmia or ugly nasal discharge.  She does not have any raspy voice.  She does not have any chest pain.  All of this issue occurs even though she has been consistently using Symbicort, nasal fluticasone, and Protonix. ? ?Past Medical History:  ?Diagnosis Date  ? Allergy   ? Anemia   ? Anxiety   ? Arthritis   ? Asthma   ? Depression   ? Family history of breast cancer   ? GERD (gastroesophageal reflux disease)   ? Hyperlipidemia   ? Pneumonia 1987  ? Pre-diabetes   ? Radial scar of breast   ? Reflux   ? Single cyst of left breast 02/01/12  ? Diagnostic mammogram and ultrasound - cyst at 2:30  ? ? ?Past Surgical History:  ?Procedure Laterality Date  ? BREAST LUMPECTOMY WITH RADIOACTIVE SEED LOCALIZATION Left 12/21/2019  ? Procedure: LEFT BREAST LUMPECTOMY WITH RADIOACTIVE SEED LOCALIZATION;  Surgeon: Erroll Luna, MD;  Location: [facility];  Service: General;  Laterality: Left;  ? [place]  ? COLONOSCOPY    ? INTRAUTERINE DEVICE (IUD) INSERTION  08/19/2015  ? Mirena  ? POLYPECTOMY    ? UPPER GASTROINTESTINAL ENDOSCOPY    ? WISDOM TOOTH EXTRACTION    ? ? ?Allergies as of 05/21/2021   ? ?   Reactions  ? Shellfish Allergy Anaphylaxis  ? Throat closes.  ? Penicillins Other (See Comments)  ? Mouth sores.  ? ?  ? ?  ?Medication List  ? ? ?albuterol 108 (90 Base) MCG/ACT inhaler ?Commonly known as: ProAir HFA ?  Inhale 2 puffs into the lungs every 4 (four) hours as needed for wheezing or shortness of breath. ?  ?budesonide-formoterol 160-4.5 MCG/ACT inhaler ?Commonly known as: SYMBICORT ?Inhale 2 puffs into the lungs 2 (two) times daily. Reported on 05/21/2015 ?  ?CO Q 10 PO ?Take by mouth. ?  ?Crestor 5 MG tablet ?Generic drug: rosuvastatin ?Take 5 mg by mouth every evening. ?  ?EPINEPHrine 0.3 mg/0.3 mL Soaj injection ?Commonly known as: EpiPen 2-Pak ?Inject 0.3 mLs (0.3 mg total) into the muscle once. ?  ?fluvoxaMINE 100 MG tablet ?Commonly known as: LUVOX ?Take 100 mg by mouth at  bedtime. Taking '150mg'$  daily ?  ?minoxidil 2.5 MG tablet ?Commonly known as: LONITEN ?Take by mouth. ?  ?multivitamin with minerals Tabs tablet ?Take 1 tablet by mouth daily. ?  ?pantoprazole 20 MG tablet ?Commonly known as: PROTONIX ?Take 20 mg by mouth daily. ?  ?spironolactone 50 MG tablet ?Commonly known as: ALDACTONE ?Take 50 mg by mouth 2 (two) times daily. ?  ?traZODone 25 mg Tabs tablet ?Commonly known as: DESYREL ?Take 25 mg by mouth at bedtime. ?  ?Vitamin D-3 25 MCG (1000 UT) Caps ?Take 1 capsule by mouth daily. ?  ? ?Review of systems negative except as noted in HPI / PMHx or noted below: ? ?Review of Systems  ?Constitutional: Negative.   ?HENT: Negative.    ?Eyes: Negative.   ?Respiratory: Negative.    ?Cardiovascular: Negative.   ?Gastrointestinal: Negative.   ?Genitourinary: Negative.   ?Musculoskeletal: Negative.   ?Skin: Negative.   ?Neurological: Negative.   ?Endo/Heme/Allergies: Negative.   ?Psychiatric/Behavioral: Negative.    ? ?Family History  ?Problem Relation Age of Onset  ? Breast cancer Mother 58  ? Depression Mother   ? Anxiety disorder Mother   ? Osteoporosis Mother   ? Thyroid disease Mother   ?     on synthroid  ? Depression Sister   ? Anxiety disorder Sister   ? Stroke Father   ?     died 03/14/2012  ? Dementia Father   ? Alzheimer's disease Paternal Aunt   ? Breast cancer Cousin   ?     triple negative  ? Breast cancer Cousin   ?     mom's cousin  ? Colon cancer Neg Hx   ? Colon polyps Neg Hx   ? Esophageal cancer Neg Hx   ? Rectal cancer Neg Hx   ? Stomach cancer Neg Hx   ? ? ?Social History  ? ?Socioeconomic History  ? Marital status: Married  ?  Spouse name: Not on file  ? Number of children: Not on file  ? Years of education: Not on file  ? Highest education level: Not on file  ?Occupational History  ? Not on file  ?Tobacco Use  ? Smoking status: Never  ? Smokeless tobacco: Never  ?Vaping Use  ? Vaping Use: Never used  ?Substance and Sexual Activity  ? Alcohol use: Yes  ?   Alcohol/week: 0.0 - 1.0 standard drinks  ? Drug use: No  ? Sexual activity: Yes  ?  Partners: Male  ?  Comment: Mirena Inserted 08/19/15  ?Other Topics Concern  ? Not on file  ?Social History Narrative  ? Not on file  ? ?Environmental and Social history ? ?Lives in a house with a dry environment, dog look inside the household, no carpet in the bedroom, plastic on the bed, plastic on the pillow, no smoking ongoing with inside the household.  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 ? ?Objective:  ? ?Vitals:  ? 05/21/21 0910  ?BP: 124/82  ?Pulse: 82  ?Resp: 16  ?SpO2: 96%  ? ?Height: '5\' 4"'$  (162.6 cm) ?Weight: 147 lb 12.8 oz (67 kg) ? ?Physical Exam ?Constitutional:   ?   Appearance: She is not diaphoretic.  ?HENT:  ?   Head: Normocephalic.  ?   Right Ear: Tympanic membrane, ear canal and external ear normal.  ?   Left Ear: Tympanic membrane, ear canal and external ear normal.  ?   Nose: Nose normal. No mucosal edema or rhinorrhea.  ?   Mouth/Throat:  ?   Pharynx: Uvula midline. No oropharyngeal exudate.  ?Eyes:  ?   Conjunctiva/sclera: Conjunctivae normal.  ?Neck:  ?   Thyroid: No thyromegaly.  ?   Trachea: Trachea normal. No tracheal tenderness or tracheal deviation.  ?Cardiovascular:  ?   Rate and Rhythm: Normal rate and regular rhythm.  ?   Heart sounds: Normal heart sounds, S1 normal and S2 normal. No murmur heard. ?Pulmonary:  ?   Effort: No respiratory distress.  ?   Breath sounds: Normal breath sounds. No stridor. No wheezing or rales.  ?Lymphadenopathy:  ?   Head:  ?   Right side of head: No tonsillar adenopathy.  ?   Left side of head: No tonsillar adenopathy.  ?   Cervical: No cervical adenopathy.  ?Skin: ?   Findings: No erythema or rash.  ?   Nails: There is no clubbing.  ?Neurological:  ?   Mental Status: She is alert.  ? ? ?Diagnostics: Allergy skin tests were not performed.  ? ?Spirometry was performed and demonstrated an FEV1 of 1.91 @ 78 % of predicted. FEV1/FVC =  0.70. ? ?Assessment and Plan:  ? ? ?1. Not well controlled moderate persistent asthma   ?2. Perennial allergic rhinitis   ?3. LPRD (laryngopharyngeal reflux disease)   ? ? ?1.  Treat and prevent inflammation: ? ?A. Brezt

## 2021-05-22 ENCOUNTER — Encounter: Payer: Self-pay | Admitting: Allergy and Immunology

## 2021-07-01 ENCOUNTER — Other Ambulatory Visit: Payer: Self-pay

## 2021-07-01 MED ORDER — TAMOXIFEN CITRATE 20 MG PO TABS: 20.0000 mg | ORAL_TABLET | Freq: Every day | ORAL | 11 refills | Status: DC

## 2021-07-18 ENCOUNTER — Encounter: Payer: Self-pay | Admitting: Gastroenterology

## 2021-07-23 ENCOUNTER — Ambulatory Visit (INDEPENDENT_AMBULATORY_CARE_PROVIDER_SITE_OTHER): Payer: No Typology Code available for payment source | Admitting: Allergy and Immunology

## 2021-07-23 ENCOUNTER — Encounter: Payer: Self-pay | Admitting: Allergy and Immunology

## 2021-07-23 VITALS — BP 114/78 | HR 60 | Resp 14

## 2021-07-23 DIAGNOSIS — J4540 Moderate persistent asthma, uncomplicated: Secondary | ICD-10-CM

## 2021-07-23 DIAGNOSIS — K219 Gastro-esophageal reflux disease without esophagitis: Secondary | ICD-10-CM | POA: Diagnosis not present

## 2021-07-23 DIAGNOSIS — J324 Chronic pansinusitis: Secondary | ICD-10-CM

## 2021-07-23 DIAGNOSIS — J3089 Other allergic rhinitis: Secondary | ICD-10-CM

## 2021-07-23 MED ORDER — EPINEPHRINE 0.3 MG/0.3ML IJ SOAJ: INTRAMUSCULAR | 2 refills | Status: DC

## 2021-07-23 NOTE — Progress Notes (Signed)
[name]   Follow-up Note  Referring Provider: Serita Grammes, MD Primary Provider: Serita Grammes, MD Date of Office Visit: 07/23/2021  Subjective:   Allison Soto (DOB: November 30, 1960) is a 61 y.o. female who returns to the [facility] on 07/23/2021 in re-evaluation of the following:  HPI: Allison Soto returns to this clinic in evaluation of asthma, allergic rhinitis, LPR, and history of shellfish allergy.  I last saw her in this clinic during her initial evaluation on 21 May 2021.  During her last visit we had her start a treatment plan directed against respiratory tract inflammation and reflux/LPR.  This apparently did not help her cough and she continues to have cough and started a systemic steroid Dosepak this past weekend which definitely has helped her cough.  She can sing without any difficulty.  She can walk without any difficulty.  She does not use a short acting bronchodilator  Allergies as of 07/23/2021       Reactions   Shellfish Allergy Anaphylaxis   Throat closes.   Penicillins Other (See Comments)   Mouth sores.        Medication List    albuterol 108 (90 Base) MCG/ACT inhaler Commonly known as: ProAir HFA Inhale 2 puffs into the lungs every 4 (four) hours as needed for wheezing or shortness of breath.   Atrovent HFA 17 MCG/ACT inhaler Generic drug: ipratropium SMARTSIG:2 Puff(s) Via Inhaler Every 6 Hours   Breztri Aerosphere 160-9-4.8 MCG/ACT Aero Generic drug: Budeson-Glycopyrrol-Formoterol Inhale 2 puffs into the lungs in the morning and at bedtime.   Carbinoxamine Maleate 4 MG Tabs 1-2 tablets 1-2 times per day if needed   CO Q 10 PO Take by mouth.   Crestor 5 MG tablet Generic drug: rosuvastatin Take 5 mg by mouth every evening.   EPINEPHrine 0.3 mg/0.3 mL Soaj injection Commonly known as: EpiPen 2-Pak Inject 0.3 mLs (0.3 mg total) into the muscle once.    famotidine 40 MG tablet Commonly known as: PEPCID Take 1 tablet (40 mg total) by mouth every evening.   fluvoxaMINE 100 MG tablet Commonly known as: LUVOX Take 100 mg by mouth at bedtime. Taking '150mg'$  daily   ketoconazole 2 % shampoo Commonly known as: NIZORAL Apply topically 3 (three) times a week.   methylPREDNISolone 4 MG Tbpk tablet Commonly known as: MEDROL DOSEPAK Take by mouth.   minoxidil 2.5 MG tablet Commonly known as: LONITEN Take by mouth.   multivitamin with minerals Tabs tablet Take 1 tablet by mouth daily.   pantoprazole 40 MG tablet Commonly known as: Protonix Take 1 tablet (40 mg total) by mouth daily.   Ryaltris 272-53 MCG/ACT Susp Generic drug: Olopatadine-Mometasone 2 sprays each nostril 2 times per day   spironolactone 50 MG tablet Commonly known as: ALDACTONE Take 50 mg by mouth 2 (two) times daily.   tamoxifen 20 MG tablet Commonly known as: NOLVADEX Take 1 tablet (20 mg total) by mouth daily.   traZODone 25 mg Tabs tablet Commonly known as: DESYREL Take 25 mg by mouth at bedtime.   Vitamin D-3 25 MCG (1000 UT) Caps Take 1 capsule by mouth daily.    Past Medical History:  Diagnosis Date   Allergy    Anemia    Anxiety    Arthritis    Asthma    Depression    Family history of breast cancer    GERD (gastroesophageal reflux disease)    Hyperlipidemia  Pneumonia 1987   Pre-diabetes    Radial scar of breast    Reflux    Single cyst of left breast 02/01/12   Diagnostic mammogram and ultrasound - cyst at 2:30    Past Surgical History:  Procedure Laterality Date   BREAST LUMPECTOMY WITH RADIOACTIVE SEED LOCALIZATION Left 12/21/2019   Procedure: LEFT BREAST LUMPECTOMY WITH RADIOACTIVE SEED LOCALIZATION;  Surgeon: Erroll Luna, MD;  Location: [place];  Service: General;  Laterality: Left;   [place] (IUD) INSERTION  08/19/2015   Mirena   POLYPECTOMY      UPPER GASTROINTESTINAL ENDOSCOPY     WISDOM TOOTH EXTRACTION      Review of systems negative except as noted in HPI / PMHx or noted below:  Review of Systems  Constitutional: Negative.   HENT: Negative.    Eyes: Negative.   Respiratory: Negative.    Cardiovascular: Negative.   Gastrointestinal: Negative.   Genitourinary: Negative.   Musculoskeletal: Negative.   Skin: Negative.   Neurological: Negative.   Endo/Heme/Allergies: Negative.   Psychiatric/Behavioral: Negative.       Objective:   Vitals:   07/23/21 1556  BP: 114/78  Pulse: 60  Resp: 14  SpO2: 96%          Physical Exam Constitutional:      Appearance: She is not diaphoretic.     Comments: Cough  HENT:     Head: Normocephalic.     Right Ear: Tympanic membrane, ear canal and external ear normal.     Left Ear: Tympanic membrane, ear canal and external ear normal.     Nose: Nose normal. No mucosal edema or rhinorrhea.     Mouth/Throat:     Pharynx: Uvula midline. No oropharyngeal exudate.  Eyes:     Conjunctiva/sclera: Conjunctivae normal.  Neck:     Thyroid: No thyromegaly.     Trachea: Trachea normal. No tracheal tenderness or tracheal deviation.  Cardiovascular:     Rate and Rhythm: Normal rate and regular rhythm.     Heart sounds: Normal heart sounds, S1 normal and S2 normal. No murmur heard. Pulmonary:     Effort: No respiratory distress.     Breath sounds: Normal breath sounds. No stridor. No wheezing or rales.  Lymphadenopathy:     Head:     Right side of head: No tonsillar adenopathy.     Left side of head: No tonsillar adenopathy.     Cervical: No cervical adenopathy.  Skin:    Findings: No erythema or rash.     Nails: There is no clubbing.  Neurological:     Mental Status: She is alert.     Diagnostics:    Spirometry was performed and demonstrated an FEV1 of 1.99 at 82 % of predicted.  Assessment and Plan:   1. Not well controlled moderate persistent asthma   2. Perennial  allergic rhinitis   3. Chronic pansinusitis   4. LPRD (laryngopharyngeal reflux disease)     1.  Treat and prevent inflammation:  A. Breztri - 2 inhalations 2 times per day (empty lungs) B. Flonase - 1 spray each nostril 2 times per day C. Complete medrol dose pack  2.  Treat and prevent reflux/LPR:  A. Pantoprazole 40 mg - 1 tablet 2 times per day B. Famotidine 40 mg - 1 tablet in PM C.  Replace throat clearing with swallowing/drinking maneuver  3.  If needed:  A.  Albuterol HFA - 2 inhalations every 4-6 hours B. Mucinex DM - 2 times per day C. Carbinoxamine 4 mg - 1-2 tablets 1-2 times per day D. Epi-pen  4.  Obtain chest x-ray and sinus CT scan  5.  Obtain evaluation of throat for chronic cough/LPR  6.  Obtain recent blood test results from primary care doctor  7.  Return to clinic in 8 weeks or earlier if problem  We now need to image Allison's upper and lower airway and also have evaluation of her mid airway with ENT while she continues on a collection of anti-inflammatory agents directed against respiratory tract inflammation and reflux.  I will contact her with the results of her diagnostic testing once they are available for review.  She recently had some blood tests including a CBC with differential and will obtain those blood test for review.  Allena Katz, MD Allergy / Immunology [place]

## 2021-07-23 NOTE — Patient Instructions (Addendum)
  1.  Treat and prevent inflammation:  A. Breztri - 2 inhalations 2 times per day (empty lungs) B. Flonase - 1 spray each nostril 2 times per day C. Complete medrol dose pack  2.  Treat and prevent reflux/LPR:  A. Pantoprazole 40 mg - 1 tablet 2 times per day B. Famotidine 40 mg - 1 tablet in PM C.  Replace throat clearing with swallowing/drinking maneuver  3.  If needed:  A. Albuterol HFA - 2 inhalations every 4-6 hours B. Mucinex DM - 2 times per day C. Carbinoxamine 4 mg - 1-2 tablets 1-2 times per day D. Epi-pen  4.  Obtain chest x-ray and sinus CT scan  5.  Obtain evaluation of throat for chronic cough/LPR  6.  Obtain recent blood test results from primary care doctor  7.  Return to clinic in 8 weeks or earlier if problem

## 2021-07-24 ENCOUNTER — Encounter: Payer: Self-pay | Admitting: Allergy and Immunology

## 2021-07-24 ENCOUNTER — Telehealth: Payer: Self-pay | Admitting: Gastroenterology

## 2021-07-24 ENCOUNTER — Telehealth: Payer: Self-pay

## 2021-07-24 NOTE — Telephone Encounter (Signed)
Called and informed patient that she is scheduled for the sinus ct scan at [facility] on Wednesday, June 21st, needing to check-in at 10:00am.  Note:  PA approval reference # [id number] Valid from 07/24/2021 to 01/20/2022 Requisition form faxed to [facility]

## 2021-07-24 NOTE — Telephone Encounter (Signed)
Patient rescheduled for previst 08/21/21 at 1:00 pm.

## 2021-07-28 ENCOUNTER — Ambulatory Visit: Payer: No Typology Code available for payment source | Admitting: Allergy and Immunology

## 2021-08-03 ENCOUNTER — Encounter: Payer: Self-pay | Admitting: Allergy and Immunology

## 2021-08-04 ENCOUNTER — Other Ambulatory Visit: Payer: Self-pay

## 2021-08-04 MED ORDER — FLUCONAZOLE 150 MG PO TABS: ORAL_TABLET | ORAL | 0 refills | Status: DC

## 2021-08-04 NOTE — Telephone Encounter (Signed)
We can have Allison Soto use Diflucan 150 mg - 1 tablet 1 time per week for 3 weeks.

## 2021-08-14 ENCOUNTER — Encounter: Payer: Self-pay | Admitting: Allergy and Immunology

## 2021-08-21 ENCOUNTER — Ambulatory Visit (AMBULATORY_SURGERY_CENTER): Payer: Self-pay

## 2021-08-21 ENCOUNTER — Other Ambulatory Visit: Payer: Self-pay

## 2021-08-21 ENCOUNTER — Inpatient Hospital Stay
Payer: No Typology Code available for payment source | Attending: Hematology and Oncology | Admitting: Hematology and Oncology

## 2021-08-21 VITALS — Ht 65.5 in | Wt 148.0 lb

## 2021-08-21 VITALS — BP 119/82 | HR 81 | Temp 97.0°F | Resp 17 | Wt 147.4 lb

## 2021-08-21 DIAGNOSIS — Z803 Family history of malignant neoplasm of breast: Secondary | ICD-10-CM | POA: Diagnosis not present

## 2021-08-21 DIAGNOSIS — E785 Hyperlipidemia, unspecified: Secondary | ICD-10-CM | POA: Diagnosis not present

## 2021-08-21 DIAGNOSIS — Z79899 Other long term (current) drug therapy: Secondary | ICD-10-CM | POA: Insufficient documentation

## 2021-08-21 DIAGNOSIS — Z9189 Other specified personal risk factors, not elsewhere classified: Secondary | ICD-10-CM

## 2021-08-21 DIAGNOSIS — D649 Anemia, unspecified: Secondary | ICD-10-CM | POA: Diagnosis not present

## 2021-08-21 DIAGNOSIS — K219 Gastro-esophageal reflux disease without esophagitis: Secondary | ICD-10-CM | POA: Insufficient documentation

## 2021-08-21 DIAGNOSIS — M129 Arthropathy, unspecified: Secondary | ICD-10-CM | POA: Insufficient documentation

## 2021-08-21 DIAGNOSIS — N6002 Solitary cyst of left breast: Secondary | ICD-10-CM | POA: Diagnosis not present

## 2021-08-21 DIAGNOSIS — Z86010 Personal history of colonic polyps: Secondary | ICD-10-CM

## 2021-08-21 MED ORDER — NA SULFATE-K SULFATE-MG SULF 17.5-3.13-1.6 GM/177ML PO SOLN: 1.0000 | Freq: Once | ORAL | 0 refills | Status: AC

## 2021-08-21 NOTE — Progress Notes (Signed)
Denies allergies to eggs or soy products. Denies complication of anesthesia or sedation. Denies use of weight loss medication. Denies use of O2.   Emmi instructions given for colonoscopy.  

## 2021-08-21 NOTE — Progress Notes (Signed)
[name]  Patient Care Team: Serita Grammes, MD as PCP - General (Family Medicine) Buford Dresser, MD as PCP - Cardiology (Cardiology)  CHIEF COMPLAINTS/PURPOSE OF CONSULTATION:  High risk for breast cancer.  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  She is currently on tamoxifen for prevention.  She is tolerating this very well except for some hair thinning that she noticed.  She denies any other complaints such as vaginal discharge, hot flashes or vaginal bleeding or change in her breathing.   Last MRI March 2023, negative for maligancy, mammogram scheduled for August. Physical examination today, bilateral breasts examined, no palpable masses or regional adenopathy. She will proceed with mammogram as scheduled in August, she has a follow-up with her OB/GYN this fall, she will return to clinic with [place] in March 2024.  We will order an MRI breast when she comes to see [place] for follow-up.  She was instructed to continue self breast exam monthly and report any changes to [place]. Thank you for consulting on the care of this patient.  Please not hesitate to contact us with any additional questions or concerns  HISTORY OF PRESENTING ILLNESS:   Allison Soto 61 y.o. female is here because of high risk of breast cancer.  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  She is here for follow up on Tamoxifen. She denies any complaints today except for some hair thinning.  She however has this issue for the past 5 years that has been working with dermatology, takes ketoconazole shampoo, spironolactone, minoxidil and is considering topical finasteride. She denies any changes in her breast. Rest of the pertinent 10 point ROS reviewed and negative.   MEDICAL HISTORY:  Past Medical History:  Diagnosis Date   Allergy    Anemia    Anxiety    Arthritis    Asthma    Depression    Family history of breast cancer    GERD (gastroesophageal reflux disease)    Hyperlipidemia    Pneumonia 1987   Pre-diabetes    Radial scar of breast    Reflux    Single cyst of left breast 02/01/12   Diagnostic mammogram and ultrasound - cyst at 2:30    SURGICAL HISTORY: Past Surgical History:  Procedure Laterality Date   BREAST LUMPECTOMY WITH RADIOACTIVE SEED LOCALIZATION Left 12/21/2019   Procedure: LEFT BREAST LUMPECTOMY WITH RADIOACTIVE SEED LOCALIZATION;  Surgeon: Erroll Luna, MD;  Location: [place];  Service: General;  Laterality: Left;   [place] (IUD) INSERTION  08/19/2015   Mirena   POLYPECTOMY     UPPER GASTROINTESTINAL ENDOSCOPY     WISDOM TOOTH EXTRACTION      SOCIAL HISTORY: Social History   Socioeconomic History   Marital status: Married    Spouse  name: Not on file   Number of children: Not on file   Years of education: Not on file   Highest education level: Not on file  Occupational History   Not on file  Tobacco Use   Smoking status: Never   Smokeless tobacco: Never  Vaping Use   Vaping Use: Never used  Substance and Sexual Activity   Alcohol use: Yes    Alcohol/week: 0.0 - 1.0 standard drinks of alcohol    Comment: Occasionally   Drug use: No   Sexual  activity: Yes    Partners: Male    Comment: Mirena Inserted 08/19/15  Other Topics Concern   Not on file  Social History Narrative   Not on file   Social Determinants of Health   Financial Resource Strain: Not on file  Food Insecurity: Not on file  Transportation Needs: Not on file  Physical Activity: Not on file  Stress: Not on file  Social Connections: Not on file  Intimate Partner Violence: Not on file    FAMILY HISTORY: Family History  Problem Relation Age of Onset   Breast cancer Mother 68   Depression Mother    Anxiety disorder Mother    Osteoporosis Mother    Thyroid disease Mother        on synthroid   Depression Sister    Anxiety disorder Sister    Stroke Father        died Feb 28, 2012   Dementia Father    Alzheimer's disease Paternal Aunt    Breast cancer Cousin        triple negative   Breast cancer Cousin        mom's cousin   Colon cancer Neg Hx    Colon polyps Neg Hx    Esophageal cancer Neg Hx    Rectal cancer Neg Hx    Stomach cancer Neg Hx     ALLERGIES:  is allergic to shellfish allergy and penicillins.  MEDICATIONS:  Current Outpatient Medications  Medication Sig Dispense Refill   albuterol (PROAIR HFA) 108 (90 Base) MCG/ACT inhaler Inhale 2 puffs into the lungs every 4 (four) hours as needed for wheezing or shortness of breath. 1 Inhaler 3   budesonide-formoterol (SYMBICORT) 160-4.5 MCG/ACT inhaler Inhale 2 puffs into the lungs 2 (two) times daily.     Cholecalciferol (VITAMIN D-3) 1000 units CAPS Take 1 capsule by mouth daily.     Coenzyme Q10 (CO Q 10 PO) Take by mouth. (Patient not taking: Reported on 08/21/2021)     CRESTOR 5 MG tablet Take 10 mg by mouth every evening.  12   EPINEPHrine (EPIPEN 2-PAK) 0.3 mg/0.3 mL IJ SOAJ injection Use as directed for life-threatening allergic reaction. 2 each 2   famotidine (PEPCID) 40 MG tablet Take 1 tablet (40 mg total) by mouth every evening. 30 tablet 3   fluconazole (DIFLUCAN) 150 MG tablet Take one  tablet by mouth once a week for three weeks. 3 tablet 0   fluvoxaMINE (LUVOX) 100 MG tablet Take 100 mg by mouth at bedtime. Taking 112m daily     ketoconazole (NIZORAL) 2 % shampoo Apply topically 3 (three) times a week.     loratadine (CLARITIN) 10 MG tablet Take 10 mg by mouth daily.     minoxidil (LONITEN) 2.5 MG tablet Take by mouth.     Multiple Vitamin (MULTIVITAMIN WITH MINERALS) TABS tablet Take 1 tablet by mouth daily.     Na Sulfate-K Sulfate-Mg Sulf 17.5-3.13-1.6 GM/177ML SOLN Take 1 kit  by mouth once for 1 dose. May use generic Suprep; use Good Rx coupon 354 mL 0   pantoprazole (PROTONIX) 40 MG tablet Take 1 tablet (40 mg total) by mouth daily. 180 tablet 2   spironolactone (ALDACTONE) 50 MG tablet Take 50 mg by mouth 2 (two) times daily.     tamoxifen (NOLVADEX) 20 MG tablet Take 1 tablet (20 mg total) by mouth daily. 30 tablet 11   traZODone (DESYREL) 25 mg TABS tablet Take 25 mg by mouth at bedtime.     No current facility-administered medications for this visit.     PHYSICAL EXAMINATION:  ECOG PERFORMANCE STATUS: 0 - Asymptomatic  Vital signs and physical examination not done, telehealth visit  BP 119/82 (BP Location: Left Arm)   Pulse 81   Temp (!) 97 F (36.1 C) (Temporal)   Resp 17   Wt 147 lb 6.4 oz (66.9 kg)   LMP 01/23/2017   SpO2 96%   BMI 24.16 kg/m   Physical Exam Constitutional:      Appearance: Normal appearance.  Cardiovascular:     Rate and Rhythm: Normal rate and regular rhythm.  Pulmonary:     Effort: Pulmonary effort is normal.     Breath sounds: Normal breath sounds.  Chest:     Comments: Bilateral breast examined.  No palpable masses or regional adenopathy Abdominal:     General: Abdomen is flat. Bowel sounds are normal.     Palpations: Abdomen is soft.  Musculoskeletal:     Cervical back: Normal range of motion and neck supple. No rigidity.  Lymphadenopathy:     Cervical: No cervical adenopathy.  Skin:    General: Skin is warm  and dry.  Neurological:     General: No focal deficit present.     Mental Status: She is alert.     LABORATORY DATA:  I have reviewed the data as listed Lab Results  Component Value Date   WBC 5.4 12/18/2019   HGB 15.6 (H) 12/18/2019   HCT 47.5 (H) 12/18/2019   MCV 97.3 12/18/2019   PLT 247 12/18/2019     Chemistry      Component Value Date/Time   NA 140 12/18/2019 0930   K 5.0 12/18/2019 0930   CL 103 12/18/2019 0930   CO2 29 12/18/2019 0930   BUN 12 12/18/2019 0930   CREATININE 0.78 12/18/2019 0930      Component Value Date/Time   CALCIUM 9.4 12/18/2019 0930   ALKPHOS 44 12/18/2019 0930   AST 29 12/18/2019 0930   ALT 32 12/18/2019 0930   BILITOT 0.3 12/18/2019 0930       RADIOGRAPHIC STUDIES: I have personally reviewed the radiological images as listed and agreed with the findings in the report. No results found.  I spent 20 minutes in the care of this patient including history, review of records, counseling about role of low-dose tamoxifen, surveillance with MRIs and mammograms and documentation.   Benay Pike, MD 08/21/2021 3:57 PM

## 2021-08-22 ENCOUNTER — Telehealth: Payer: Self-pay | Admitting: Hematology and Oncology

## 2021-08-22 NOTE — Telephone Encounter (Signed)
Scheduled appointment per 07/13 los. Left message.

## 2021-08-27 ENCOUNTER — Other Ambulatory Visit: Payer: Self-pay | Admitting: Family Medicine

## 2021-08-27 DIAGNOSIS — I672 Cerebral atherosclerosis: Secondary | ICD-10-CM

## 2021-09-01 ENCOUNTER — Ambulatory Visit
Admission: RE | Admit: 2021-09-01 | Discharge: 2021-09-01 | Disposition: A | Discharge status: Home / Self Care | Payer: No Typology Code available for payment source | Source: Ambulatory Visit | Attending: Family Medicine | Admitting: Family Medicine

## 2021-09-01 DIAGNOSIS — I672 Cerebral atherosclerosis: Secondary | ICD-10-CM

## 2021-09-12 ENCOUNTER — Ambulatory Visit
Admission: RE | Admit: 2021-09-12 | Discharge: 2021-09-12 | Disposition: A | Discharge status: Home / Self Care | Payer: No Typology Code available for payment source | Source: Ambulatory Visit | Attending: Hematology and Oncology | Admitting: Hematology and Oncology

## 2021-09-12 ENCOUNTER — Other Ambulatory Visit: Payer: Self-pay | Admitting: Allergy and Immunology

## 2021-09-12 DIAGNOSIS — Z9189 Other specified personal risk factors, not elsewhere classified: Secondary | ICD-10-CM

## 2021-09-16 ENCOUNTER — Ambulatory Visit (HOSPITAL_BASED_OUTPATIENT_CLINIC_OR_DEPARTMENT_OTHER): Payer: No Typology Code available for payment source | Admitting: Obstetrics & Gynecology

## 2021-09-16 ENCOUNTER — Encounter: Payer: Self-pay | Admitting: *Deleted

## 2021-09-17 ENCOUNTER — Encounter: Payer: Self-pay | Admitting: Gastroenterology

## 2021-09-18 ENCOUNTER — Ambulatory Visit (INDEPENDENT_AMBULATORY_CARE_PROVIDER_SITE_OTHER): Payer: No Typology Code available for payment source | Admitting: Nurse Practitioner

## 2021-09-18 ENCOUNTER — Encounter: Payer: Self-pay | Admitting: Allergy and Immunology

## 2021-09-18 ENCOUNTER — Encounter: Payer: Self-pay | Admitting: Nurse Practitioner

## 2021-09-18 ENCOUNTER — Ambulatory Visit (INDEPENDENT_AMBULATORY_CARE_PROVIDER_SITE_OTHER): Payer: No Typology Code available for payment source | Admitting: Allergy and Immunology

## 2021-09-18 VITALS — BP 98/72 | HR 87 | Ht 65.0 in | Wt 146.0 lb

## 2021-09-18 VITALS — BP 102/74 | HR 68 | Resp 12

## 2021-09-18 DIAGNOSIS — K219 Gastro-esophageal reflux disease without esophagitis: Secondary | ICD-10-CM

## 2021-09-18 DIAGNOSIS — Z91018 Allergy to other foods: Secondary | ICD-10-CM

## 2021-09-18 DIAGNOSIS — J4540 Moderate persistent asthma, uncomplicated: Secondary | ICD-10-CM | POA: Diagnosis not present

## 2021-09-18 DIAGNOSIS — Z86010 Personal history of colonic polyps: Secondary | ICD-10-CM

## 2021-09-18 DIAGNOSIS — J3089 Other allergic rhinitis: Secondary | ICD-10-CM

## 2021-09-18 DIAGNOSIS — R059 Cough, unspecified: Secondary | ICD-10-CM

## 2021-09-18 NOTE — Patient Instructions (Signed)
Keep your appointment for your EGD/colon on 09/24/21 at 10:00 am.   The  GI providers would like to encourage you to use [facility] to communicate with providers for non-urgent requests or questions.  Due to long hold times on the telephone, sending your provider a message by [facility] may be a faster and more efficient way to get a response.  Please allow 48 business hours for a response.  Please remember that this is for non-urgent requests.

## 2021-09-18 NOTE — Progress Notes (Signed)
09/18/2021 Allison Soto [id number] 06-09-1960   CHIEF COMPLAINT: Schedule an EGD and colonoscopy  HISTORY OF PRESENT ILLNESS: Allison Soto is a 61 year old female with a past medical history of GERD, gastric fundic gland polyps and colon polyps.  She presents today to schedule an EGD and colonoscopy.  She is followed by Dr. Lyndel Safe.  She describes having a constant cough with frequent throat clearing which started 03/2021.  She was prescribed Pantoprazole 40 mg twice daily for 3 months by her PCP then Famotidine once daily was added. About one mont later, Pantoprazole was reduced to once daily and Famotidine was increased to twice daily.  She is currently taking Pantoprazole 40 mg once daily and Famotidine 20 mg once daily.  She was evaluated by ENT one week ago and a laryngoscopy was done which showed evidence of laryngeal reflux.  She was advised to undergo further GI evaluation/EGD.  She denies experiencing any classic heartburn symptoms.  No dysphagia.  No upper or lower abdominal pain.  She sleeps with the head of the bed elevated.  She is passing normal formed brown bowel movement daily.  No rectal bleeding or black stools.  No NSAID use.  She drinks 1 Diet Coke in the morning.  She stopped drinking carbonated water.  She underwent an EGD 07/31/2009 which showed GERD and gastric polyps.  She underwent a colonoscopy 08/25/2018 identified 2 (25m) tubular adenomatous polyps which were removed from the ascending colon and cecum, and a 6 mm tubular adenomatous polyp was removed from the mid descending colon.  Repeat colonoscopy in 3 years was recommended.     Latest Ref Rng & Units 12/18/2019    9:30 AM 09/22/2013    4:21 PM  CBC  WBC 4.0 - 10.5 K/uL 5.4  8.9   Hemoglobin 12.0 - 15.0 g/dL 15.6  15.4   Hematocrit 36.0 - 46.0 % 47.5  44.0   Platelets 150 - 400 K/uL 247  243        Latest Ref Rng & Units 12/18/2019    9:30 AM  CMP  Glucose 70 - 99 mg/dL 106   BUN 6 - 20 mg/dL 12    Creatinine 0.44 - 1.00 mg/dL 0.78   Sodium 135 - 145 mmol/L 140   Potassium 3.5 - 5.1 mmol/L 5.0   Chloride 98 - 111 mmol/L 103   CO2 22 - 32 mmol/L 29   Calcium 8.9 - 10.3 mg/dL 9.4   Total Protein 6.5 - 8.1 g/dL 6.3   Total Bilirubin 0.3 - 1.2 mg/dL 0.3   Alkaline Phos 38 - 126 U/L 44   AST 15 - 41 U/L 29   ALT 0 - 44 U/L 32     Colonoscopy 08/25/2018: - Two sessile polyps were found in the ascending colon and cecum. The polyps were 6 mm in size. These polyps were removed with a cold snare. Resection and retrieval were complete. - A 6 mm polyp was found in the mid descending colon. The polyp was sessile. The polyp was removed with a cold snare. Resection and retrieval were complete. - Multiple medium-mouthed diverticula were found in the sigmoid colon. - Non-bleeding internal hemorrhoids were found during retroflexion. The hemorrhoids were moderate. - The exam was otherwise without abnormality on direct and retroflexion views. -- 3 year colonoscopy recall 1. Surgical [P], colon, ascending and cecum, polyp (2) - TUBULAR ADENOMA (3 OF 3 FRAGMENTS) - NO HIGH GRADE DYSPLASIA OR MALIGNANCY IDENTIFIED 2. Surgical [P], colon,  descending, polyp - TUBULAR ADENOMA (1 OF 1 FRAGMENTS) - NO HIGH GRADE DYSPLASIA OR MALIGNANCY IDENTIFIED  Colonoscopy 11/09/2012: One 39m tubular adenomatous polyp was removed from the cecum Mild sigmoid diverticulosis  EGD 07/31/2009: Irregular Z-line suggestive of reflux Fundic gland polyps  Past Medical History:  Diagnosis Date   Allergy    Anemia    Anxiety    Arthritis    Asthma    Depression    Family history of breast cancer    GERD (gastroesophageal reflux disease)    Hyperlipidemia    Pneumonia 1987   Pre-diabetes    Radial scar of breast    Reflux    Single cyst of left breast 02/01/12   Diagnostic mammogram and ultrasound - cyst at 2:30   Past Surgical History:  Procedure Laterality Date   BREAST LUMPECTOMY WITH RADIOACTIVE SEED  LOCALIZATION Left 12/21/2019   Procedure: LEFT BREAST LUMPECTOMY WITH RADIOACTIVE SEED LOCALIZATION;  Surgeon: CErroll Luna MD;  Location: [facility]  Service: General;  Laterality: Left;   [place] INSERTION  08/19/2015   Mirena   POLYPECTOMY     UPPER GASTROINTESTINAL ENDOSCOPY     WISDOM TOOTH EXTRACTION     Social History: Non-smoker.  No alcohol use.  No drug use  Family History: No known family history of esophageal, gastric or colon cancer.  Father had a stroke.  Mother with history of breast cancer, anxiety, depression and thyroid disease.  Allergies  Allergen Reactions   Shellfish Allergy Anaphylaxis    Throat closes.   Penicillins Other (See Comments)    Mouth sores.      Outpatient Encounter Medications as of 09/18/2021  Medication Sig   albuterol (PROAIR HFA) 108 (90 Base) MCG/ACT inhaler Inhale 2 puffs into the lungs every 4 (four) hours as needed for wheezing or shortness of breath.   budesonide-formoterol (SYMBICORT) 160-4.5 MCG/ACT inhaler Inhale 2 puffs into the lungs 2 (two) times daily.   Cholecalciferol (VITAMIN D-3) 1000 units CAPS Take 1 capsule by mouth daily.   Coenzyme Q10 (CO Q 10 PO) Take by mouth.   CRESTOR 5 MG tablet Take 10 mg by mouth every evening.   EPINEPHrine (EPIPEN 2-PAK) 0.3 mg/0.3 mL IJ SOAJ injection Use as directed for life-threatening allergic reaction.   famotidine (PEPCID) 40 MG tablet TAKE 1 TABLET BY MOUTH EVERY DAY IN THE EVENING   fluconazole (DIFLUCAN) 150 MG tablet Take one tablet by mouth once a week for three weeks.   fluvoxaMINE (LUVOX) 100 MG tablet Take 100 mg by mouth at bedtime. Taking '150mg'$  daily   ketoconazole (NIZORAL) 2 % shampoo Apply topically 3 (three) times a week.   minoxidil (LONITEN) 2.5 MG tablet Take by mouth.   Multiple Vitamin (MULTIVITAMIN WITH MINERALS) TABS tablet Take 1 tablet by mouth daily.   pantoprazole (PROTONIX) 40  MG tablet Take 1 tablet (40 mg total) by mouth daily.   spironolactone (ALDACTONE) 50 MG tablet Take 50 mg by mouth 2 (two) times daily.   tamoxifen (NOLVADEX) 20 MG tablet Take 1 tablet (20 mg total) by mouth daily.   traZODone (DESYREL) 25 mg TABS tablet Take 25 mg by mouth at bedtime.   [DISCONTINUED] loratadine (CLARITIN) 10 MG tablet Take 10 mg by mouth daily.   No facility-administered encounter medications on file as of 09/18/2021.    REVIEW OF SYSTEMS:  Gen: Denies fever, sweats or chills. No weight loss.  CV: Denies chest pain, palpitations or edema. Resp: Denies cough, shortness of breath of hemoptysis.  GI: Denies heartburn, dysphagia, stomach or lower abdominal pain. No diarrhea or constipation.  GU : Denies urinary burning, blood in urine, increased urinary frequency or incontinence. MS: Denies joint pain, muscles aches or weakness. Derm: Denies rash, itchiness, skin lesions or unhealing ulcers. Psych: Denies depression, anxiety, memory loss, suicidal ideation and confusion. Heme: Denies bruising, easy bleeding. Neuro:  Denies headaches, dizziness or paresthesias. Endo:  Denies any problems with DM, thyroid or adrenal function.  PHYSICAL EXAM: BP 98/72   Pulse 87   Ht '5\' 5"'$  (1.651 m)   Wt 146 lb (66.2 kg)   LMP 01/23/2017   BMI 24.30 kg/m   Wt Readings from Last 3 Encounters:  09/18/21 146 lb (66.2 kg)  08/21/21 147 lb 6.4 oz (66.9 kg)  08/21/21 148 lb (67.1 kg)    General: 61 year old female in NAD. Head: Normocephalic and atraumatic. Eyes:  Sclerae non-icteric, conjunctive pink. Ears: Normal auditory acuity. Mouth: Dentition intact. No ulcers or lesions.  Neck: Supple, no lymphadenopathy or thyromegaly.  Lungs: Clear bilaterally to auscultation without wheezes, crackles or rhonchi. Heart: Regular rate and rhythm. No murmur, rub or gallop appreciated.  Abdomen: Soft, nontender, non distended. No masses. No hepatosplenomegaly. Normoactive bowel sounds x 4  quadrants.  Rectal: Deferred.  Musculoskeletal: Symmetrical with no gross deformities. Skin: Warm and dry. No rash or lesions on visible extremities. Extremities: No edema. Neurological: Alert oriented x 4, no focal deficits.  Psychological:  Alert and cooperative. Normal mood and affect.  ASSESSMENT AND PLAN:  44) 61 year old female with a history of tubular adenomatous colon polyps. Three 6m tubular adenomatous polyps removed from the colon per colonoscopy 08/2018 and 1 tubular adenomatous polyp removed from the cecum per colonoscopy 11/2012. -Colonoscopy benefits and risks discussed including risk with sedation, risk of bleeding, perforation and infection   2) Nonproductive cough since 03/2021 which has improved after trial with PPI twice daily for 3 months then transitioned to Pantoprazole 40 mg once daily and Famotidine 40 mg once daily.  She was seen by ENT 1 week ago and laryngoscopy identified evidence of laryngeal reflux.  EGD in 2011 showed evidence of GERD and gastric fundic gland polyps. -Continue Pantoprazole 40 mg daily and famotidine '40mg'$  nightly. -GERD diet  Further recommendation to be determined after the above evaluation completed    CC:  BSerita Grammes MD

## 2021-09-18 NOTE — Patient Instructions (Addendum)
  1.  Treat and prevent inflammation:  A. Symbicort 160 - 2 inhalations 2 times per day (empty lungs) B. Flonase - 1 spray each nostril 2 times per day  2.  Treat and prevent reflux/LPR:  A. Pantoprazole 40 mg - 1 tablet in PM B. Famotidine 40 mg - 1 tablet 2 times per day C. Replace throat clearing with swallowing/drinking maneuver  3.  If needed:  A. Albuterol HFA - 2 inhalations every 4-6 hours B. Mucinex DM - 2 times per day C. Carbinoxamine 4 mg - 1-2 tablets 1-2 times per day D. Epi-pen  4.  Obtain fall flu vaccine and RSV vaccine  5.  Return to clinic in 6 months or earlier if problem

## 2021-09-18 NOTE — Progress Notes (Signed)
[place]   Follow-up Note  Referring Provider: Serita Grammes, MD Primary Provider: Serita Grammes, MD Date of Office Visit: 09/18/2021  Subjective:   Allison Soto (DOB: [birth year]) is a 61 y.o. female who returns to the [facility] on 09/18/2021 in re-evaluation of the following:  HPI: Reham returns to this clinic in evaluation of asthma, allergic rhinitis, LPR, and history of shellfish allergy.  I last saw her in this clinic on 23 July 2021.  During her last visit she continued to cough and we had her obtain a chest x-ray and sinus CT scan and visit ENT.  As well, she used Diflucan once weekly for 3 weeks.  She has significantly improved regarding her cough.  She believes that it was the Diflucan that resulted in very good control of her cough.  She continues to use anti-inflammatory agents for her airway including the use of Symbicort and Flonase and she continues to treat reflux with a combination of her proton pump inhibitor and H2 receptor blocker.  She does not consume shellfish.  Allergies as of 09/18/2021       Reactions   Shellfish Allergy Anaphylaxis   Throat closes.   Penicillins Other (See Comments)   Mouth sores.        Medication List    albuterol 108 (90 Base) MCG/ACT inhaler Commonly known as: ProAir HFA Inhale 2 puffs into the lungs every 4 (four) hours as needed for wheezing or shortness of breath.   budesonide-formoterol 160-4.5 MCG/ACT inhaler Commonly known as: SYMBICORT Inhale 2 puffs into the lungs 2 (two) times daily.   CO Q 10 PO Take by mouth.   Crestor 5 MG tablet Generic drug: rosuvastatin Take 10 mg by mouth every evening.   EPINEPHrine 0.3 mg/0.3 mL Soaj injection Commonly known as: EpiPen 2-Pak Use as directed for life-threatening allergic reaction.   famotidine 40 MG tablet Commonly known as: PEPCID TAKE 1 TABLET BY MOUTH EVERY DAY IN THE  EVENING   fluconazole 150 MG tablet Commonly known as: Diflucan Take one tablet by mouth once a week for three weeks.   fluvoxaMINE 100 MG tablet Commonly known as: LUVOX Take 100 mg by mouth at bedtime. Taking '150mg'$  daily   ketoconazole 2 % shampoo Commonly known as: NIZORAL Apply topically 3 (three) times a week.   minoxidil 2.5 MG tablet Commonly known as: LONITEN Take by mouth.   multivitamin with minerals Tabs tablet Take 1 tablet by mouth daily.   pantoprazole 40 MG tablet Commonly known as: Protonix Take 1 tablet (40 mg total) by mouth daily.   spironolactone 50 MG tablet Commonly known as: ALDACTONE Take 50 mg by mouth 2 (two) times daily.   tamoxifen 20 MG tablet Commonly known as: NOLVADEX Take 1 tablet (20 mg total) by mouth daily.   traZODone 25 mg Tabs tablet Commonly known as: DESYREL Take 25 mg by mouth at bedtime.   Vitamin D-3 25 MCG (1000 UT) Caps Take 1 capsule by mouth daily.    Past Medical History:  Diagnosis Date   Allergy    Anemia    Anxiety    Arthritis    Asthma    Depression    Family history of breast cancer    GERD (gastroesophageal reflux disease)    Hyperlipidemia    Pneumonia 1987   Pre-diabetes    Radial scar of breast    Reflux    Single cyst of  left breast 02/01/12   Diagnostic mammogram and ultrasound - cyst at 2:30    Past Surgical History:  Procedure Laterality Date   BREAST LUMPECTOMY WITH RADIOACTIVE SEED LOCALIZATION Left 12/21/2019   Procedure: LEFT BREAST LUMPECTOMY WITH RADIOACTIVE SEED LOCALIZATION;  Surgeon: Erroll Luna, MD;  Location: [facility];  Service: General;  Laterality: Left;   [place] (IUD) INSERTION  08/19/2015   Mirena   POLYPECTOMY     UPPER GASTROINTESTINAL ENDOSCOPY     WISDOM TOOTH EXTRACTION      Review of systems negative except as noted in HPI / PMHx or noted below:  Review of Systems   Constitutional: Negative.   HENT: Negative.    Eyes: Negative.   Respiratory: Negative.    Cardiovascular: Negative.   Gastrointestinal: Negative.   Genitourinary: Negative.   Musculoskeletal: Negative.   Skin: Negative.   Neurological: Negative.   Endo/Heme/Allergies: Negative.   Psychiatric/Behavioral: Negative.       Objective:   Vitals:   09/18/21 1107  BP: 102/74  Pulse: 68  Resp: 12  SpO2: 96%          Physical Exam Constitutional:      Appearance: She is not diaphoretic.  HENT:     Head: Normocephalic.     Right Ear: Tympanic membrane, ear canal and external ear normal.     Left Ear: Tympanic membrane, ear canal and external ear normal.     Nose: Nose normal. No mucosal edema or rhinorrhea.     Mouth/Throat:     Pharynx: Uvula midline. No oropharyngeal exudate.  Eyes:     Conjunctiva/sclera: Conjunctivae normal.  Neck:     Thyroid: No thyromegaly.     Trachea: Trachea normal. No tracheal tenderness or tracheal deviation.  Cardiovascular:     Rate and Rhythm: Normal rate and regular rhythm.     Heart sounds: Normal heart sounds, S1 normal and S2 normal. No murmur heard. Pulmonary:     Effort: No respiratory distress.     Breath sounds: Normal breath sounds. No stridor. No wheezing or rales.  Lymphadenopathy:     Head:     Right side of head: No tonsillar adenopathy.     Left side of head: No tonsillar adenopathy.     Cervical: No cervical adenopathy.  Skin:    Findings: No erythema or rash.     Nails: There is no clubbing.  Neurological:     Mental Status: She is alert.     Diagnostics:   Results of a rhinoscopy performed 12 September 2021 identified the following:  Flexible laryngoscopy shows patent anterior nasal cavity with minimal crusting, no discharge or infection.  Moderate interarytenoid edema and erythema consistent with laryngopharyngeal reflux, otherwise normal base of tongue and supraglottis Normal vocal cord mobility without vocal  cord nodule, mass, polyp or tumor. False vocal fold compression noted with sustained phonation, consistent with muscle tension dysphonia. Hypopharynx normal without mass, pooling of secretions or aspiration.  Results of a sinus CT scan obtained 30 July 2021 identified no significant sinus abnormality.  Results of a chest x-ray obtained 24 July 2021 identified no significant abnormality.  Assessment and Plan:   1. Asthma, moderate persistent, well-controlled   2. Perennial allergic rhinitis   3. LPRD (laryngopharyngeal reflux disease)   4. Food allergy    1.  Treat and prevent inflammation:  A. Symbicort 160 - 2 inhalations 2 times per  day (empty lungs) B. Flonase - 1 spray each nostril 2 times per day  2.  Treat and prevent reflux/LPR:  A. Pantoprazole 40 mg - 1 tablet in PM B. Famotidine 40 mg - 1 tablet 2 times per day C. Replace throat clearing with swallowing/drinking maneuver  3.  If needed:  A. Albuterol HFA - 2 inhalations every 4-6 hours B. Mucinex DM - 2 times per day C. Carbinoxamine 4 mg - 1-2 tablets 1-2 times per day D. Epi-pen  4.  Obtain chest x-ray and sinus CT scan  5.  Return to clinic in 6 months or earlier if problem  Marvie is doing much better at this point in time on her current therapy directed against respiratory tract inflammation and reflux induced respiratory disease.  There may have been a issue also tied up with the laryngeal candidiasis as she has had a very good response when we treated her with Diflucan for 3 weeks.  At this point she is going to continue on the plan noted above and we will see her back in this clinic in 6 months or earlier if there is a problem.  Allena Katz, MD Allergy / Immunology [place]

## 2021-09-19 NOTE — Progress Notes (Signed)
Agree with assessment/plan.  Raj Rielyn Krupinski, MD [facility] [phone]  

## 2021-09-22 ENCOUNTER — Encounter: Payer: Self-pay | Admitting: Allergy and Immunology

## 2021-09-24 ENCOUNTER — Encounter: Payer: No Typology Code available for payment source | Admitting: Gastroenterology

## 2021-09-24 ENCOUNTER — Encounter: Payer: Self-pay | Admitting: Gastroenterology

## 2021-09-24 ENCOUNTER — Ambulatory Visit (AMBULATORY_SURGERY_CENTER): Payer: No Typology Code available for payment source | Admitting: Gastroenterology

## 2021-09-24 VITALS — BP 121/67 | HR 62 | Temp 97.5°F | Resp 13 | Ht 65.75 in | Wt 148.0 lb

## 2021-09-24 DIAGNOSIS — R059 Cough, unspecified: Secondary | ICD-10-CM | POA: Diagnosis not present

## 2021-09-24 DIAGNOSIS — Z09 Encounter for follow-up examination after completed treatment for conditions other than malignant neoplasm: Secondary | ICD-10-CM

## 2021-09-24 DIAGNOSIS — K317 Polyp of stomach and duodenum: Secondary | ICD-10-CM | POA: Diagnosis not present

## 2021-09-24 DIAGNOSIS — D125 Benign neoplasm of sigmoid colon: Secondary | ICD-10-CM | POA: Diagnosis not present

## 2021-09-24 DIAGNOSIS — K449 Diaphragmatic hernia without obstruction or gangrene: Secondary | ICD-10-CM

## 2021-09-24 DIAGNOSIS — K2289 Other specified disease of esophagus: Secondary | ICD-10-CM | POA: Diagnosis not present

## 2021-09-24 DIAGNOSIS — K2282 Esophagogastric junction polyp: Secondary | ICD-10-CM | POA: Diagnosis not present

## 2021-09-24 DIAGNOSIS — K219 Gastro-esophageal reflux disease without esophagitis: Secondary | ICD-10-CM

## 2021-09-24 DIAGNOSIS — Z86010 Personal history of colonic polyps: Secondary | ICD-10-CM | POA: Diagnosis not present

## 2021-09-24 MED ORDER — SODIUM CHLORIDE 0.9 % IV SOLN: 500.0000 mL | Freq: Once | INTRAVENOUS | Status: DC

## 2021-09-24 NOTE — Progress Notes (Signed)
Called to room to assist during endoscopic procedure.  Patient ID and intended procedure confirmed with present staff. Received instructions for my participation in the procedure from the performing physician.  

## 2021-09-24 NOTE — Op Note (Signed)
[facility] Patient Name: Allison Soto Procedure Date: 09/24/2021 9:49 AM MRN: [id number] Endoscopist: Jackquline Denmark , MD Age: 61 Referring MD:  Date of Birth: 05-08-1960 Gender: Female Account #: [id number] Procedure:                Upper GI endoscopy Indications:              Heartburn with persistent cough. ENT evaluation                            shows LPR. Medicines:                Monitored Anesthesia Care Procedure:                Pre-Anesthesia Assessment:                           - Prior to the procedure, a History and Physical                            was performed, and patient medications and                            allergies were reviewed. The patient's tolerance of                            previous anesthesia was also reviewed. The risks                            and benefits of the procedure and the sedation                            options and risks were discussed with the patient.                            All questions were answered, and informed consent                            was obtained. Prior Anticoagulants: The patient has                            taken no previous anticoagulant or antiplatelet                            agents. ASA Grade Assessment: II - A patient with                            mild systemic disease. After reviewing the risks                            and benefits, the patient was deemed in                            satisfactory condition to undergo the procedure.  After obtaining informed consent, the endoscope was                            passed under direct vision. Throughout the                            procedure, the patient's blood pressure, pulse, and                            oxygen saturations were monitored continuously. The                            Endoscope was introduced through the mouth, and                            advanced to the second part of duodenum. The  upper                            GI endoscopy was accomplished without difficulty.                            The patient tolerated the procedure well. Scope In: Scope Out: Findings:                 The Z-line was irregular and was found 35 cm from                            the incisors. Biopsies were obtained from the                            proximal and distal esophagus with cold forceps for                            histology to r/o eosinophilic esophagitis. Biopsies                            were taken with a cold forceps for histology from                            GE junction, directed by NBI.                           A 2 cm hiatal hernia was present. Retroflexed                            examination of the cardia revealed GE junction to                            be Hill's gd 2.                           A single 10 mm sessile polyp with no bleeding and  no stigmata of recent bleeding was found at the                            diaphragmatic hiatus. The polyp was removed with a                            hot snare. Resection and retrieval were complete.                           Multiple (10-12) 6 to 8 mm sessile polyps with no                            bleeding and no stigmata of recent bleeding were                            found in the gastric body. Three polyps were                            removed with a cold snare. Resection and retrieval                            were complete.                           The examined duodenum was normal. Complications:            No immediate complications. Estimated Blood Loss:     Estimated blood loss: none. Impression:               - Z-line irregular, 35 cm from the incisors.                            Biopsied.                           - 2 cm hiatal hernia.                           - A single polyp at the diaphragmatic hiatus s/p                            hot snare polypectomy                            - Multiple gastric polyps. Resected and retrieved x                            3.                           - Normal examined duodenum. Recommendation:           - Patient has a contact number available for                            emergencies. The signs and symptoms of potential  delayed complications were discussed with the                            patient. Return to normal activities tomorrow.                            Written discharge instructions were provided to the                            patient.                           - Resume previous diet.                           - Continue present medications. Take Protonix 40 mg                            half an hour before supper and Pepcid at bedtime                            and with breakfast.                           - No aspirin, ibuprofen, naproxen, or other                            non-steroidal anti-inflammatory drugs for 5 days                            after polyp removal.                           - Await pathology results.                           - The findings and recommendations were discussed                            with the patient's family. If still with problems,                            further work-up. Jackquline Denmark, MD 09/24/2021 10:36:25 AM This report has been signed electronically.

## 2021-09-24 NOTE — Progress Notes (Signed)
To pacu, VSS. Report to [email or web address] 

## 2021-09-24 NOTE — Progress Notes (Signed)
Pt's states no medical or surgical changes since previsit or office visit. 

## 2021-09-24 NOTE — Progress Notes (Signed)
[facility] History and Physical   Primary Care Physician:  Serita Grammes, MD   Reason for Procedure:   History of polyps,  GERD  with LPR  Plan:    EGD/colon     HPI: Allison Soto is a 61 y.o. female    Past Medical History:  Diagnosis Date   Allergy    Anemia    Anxiety    Arthritis    Asthma    Depression    Family history of breast cancer    GERD (gastroesophageal reflux disease)    Hyperlipidemia    Pneumonia 1987   Pre-diabetes    Radial scar of breast    Reflux    Single cyst of left breast 02/01/12   Diagnostic mammogram and ultrasound - cyst at 2:30    Past Surgical History:  Procedure Laterality Date   BREAST LUMPECTOMY WITH RADIOACTIVE SEED LOCALIZATION Left 12/21/2019   Procedure: LEFT BREAST LUMPECTOMY WITH RADIOACTIVE SEED LOCALIZATION;  Surgeon: Erroll Luna, MD;  Location: [facility];  Service: General;  Laterality: Left;   [place] (IUD) INSERTION  08/19/2015   Mirena   POLYPECTOMY     UPPER GASTROINTESTINAL ENDOSCOPY     WISDOM TOOTH EXTRACTION      Prior to Admission medications   Medication Sig Start Date End Date Taking? Authorizing Provider  budesonide-formoterol (SYMBICORT) 160-4.5 MCG/ACT inhaler Inhale 2 puffs into the lungs 2 (two) times daily.   Yes [provider]  Cholecalciferol (VITAMIN D-3) 1000 units CAPS Take 1 capsule by mouth daily.   Yes [provider]  Coenzyme Q10 (CO Q 10 PO) Take by mouth.   Yes [provider]  CRESTOR 5 MG tablet Take 10 mg by mouth every evening. 01/09/17  Yes [provider]  famotidine (PEPCID) 40 MG tablet TAKE 1 TABLET BY MOUTH EVERY DAY IN THE EVENING 09/12/21  Yes Kozlow, Donnamarie Poag, MD  fluvoxaMINE (LUVOX) 100 MG tablet Take 100 mg by mouth at bedtime. Taking '150mg'$  daily   Yes [provider]  ketoconazole (NIZORAL) 2 % shampoo Apply topically 3 (three)  times a week. 06/17/21  Yes [provider]  minoxidil (LONITEN) 2.5 MG tablet Take by mouth. 05/04/21  Yes [provider]  pantoprazole (PROTONIX) 40 MG tablet Take 1 tablet (40 mg total) by mouth daily. 05/21/21  Yes Kozlow, Donnamarie Poag, MD  spironolactone (ALDACTONE) 50 MG tablet Take 50 mg by mouth 2 (two) times daily. 04/11/21  Yes [provider]  tamoxifen (NOLVADEX) 20 MG tablet Take 1 tablet (20 mg total) by mouth daily. 07/01/21  Yes Benay Pike, MD  traZODone (DESYREL) 25 mg TABS tablet Take 25 mg by mouth at bedtime.   Yes [provider]  albuterol (PROAIR HFA) 108 (90 Base) MCG/ACT inhaler Inhale 2 puffs into the lungs every 4 (four) hours as needed for wheezing or shortness of breath. 08/29/15   Kozlow, Donnamarie Poag, MD  EPINEPHrine (EPIPEN 2-PAK) 0.3 mg/0.3 mL IJ SOAJ injection Use as directed for life-threatening allergic reaction. 07/23/21   Kozlow, Donnamarie Poag, MD  fluconazole (DIFLUCAN) 150 MG tablet Take one tablet by mouth once a week for three weeks. 08/04/21   Kozlow, Donnamarie Poag, MD  Multiple Vitamin (MULTIVITAMIN WITH MINERALS) TABS tablet Take 1 tablet by mouth daily.    [provider]    Current Outpatient Medications  Medication Sig Dispense Refill   budesonide-formoterol (SYMBICORT)  160-4.5 MCG/ACT inhaler Inhale 2 puffs into the lungs 2 (two) times daily.     Cholecalciferol (VITAMIN D-3) 1000 units CAPS Take 1 capsule by mouth daily.     Coenzyme Q10 (CO Q 10 PO) Take by mouth.     CRESTOR 5 MG tablet Take 10 mg by mouth every evening.  12   famotidine (PEPCID) 40 MG tablet TAKE 1 TABLET BY MOUTH EVERY DAY IN THE EVENING 30 tablet 0   fluvoxaMINE (LUVOX) 100 MG tablet Take 100 mg by mouth at bedtime. Taking '150mg'$  daily     ketoconazole (NIZORAL) 2 % shampoo Apply topically 3 (three) times a week.     minoxidil (LONITEN) 2.5 MG tablet Take by mouth.     pantoprazole (PROTONIX) 40 MG tablet Take 1 tablet (40 mg total) by mouth daily. 180 tablet  2   spironolactone (ALDACTONE) 50 MG tablet Take 50 mg by mouth 2 (two) times daily.     tamoxifen (NOLVADEX) 20 MG tablet Take 1 tablet (20 mg total) by mouth daily. 30 tablet 11   traZODone (DESYREL) 25 mg TABS tablet Take 25 mg by mouth at bedtime.     albuterol (PROAIR HFA) 108 (90 Base) MCG/ACT inhaler Inhale 2 puffs into the lungs every 4 (four) hours as needed for wheezing or shortness of breath. 1 Inhaler 3   EPINEPHrine (EPIPEN 2-PAK) 0.3 mg/0.3 mL IJ SOAJ injection Use as directed for life-threatening allergic reaction. 2 each 2   fluconazole (DIFLUCAN) 150 MG tablet Take one tablet by mouth once a week for three weeks. 3 tablet 0   Multiple Vitamin (MULTIVITAMIN WITH MINERALS) TABS tablet Take 1 tablet by mouth daily.     Current Facility-Administered Medications  Medication Dose Route Frequency Provider Last Rate Last Admin   0.9 %  sodium chloride infusion  500 mL Intravenous Once Jackquline Denmark, MD        Allergies as of 09/24/2021 - Review Complete 09/24/2021  Allergen Reaction Noted   Shellfish allergy Anaphylaxis 04/26/2012   Penicillins Other (See Comments) 04/26/2012    Family History  Problem Relation Age of Onset   Breast cancer Mother 87   Depression Mother    Anxiety disorder Mother    Osteoporosis Mother    Thyroid disease Mother        on synthroid   Depression Sister    Anxiety disorder Sister    Stroke Father        died 2012-03-10   Dementia Father    Alzheimer's disease Paternal Aunt    Breast cancer Cousin        triple negative   Breast cancer Cousin        mom's cousin   Colon cancer Neg Hx    Colon polyps Neg Hx    Esophageal cancer Neg Hx    Rectal cancer Neg Hx    Stomach cancer Neg Hx     Social History   Socioeconomic History   Marital status: Married    Spouse name: Not on file   Number of children: Not on file   Years of education: Not on file   Highest education level: Not on file  Occupational History   Not on file  Tobacco  Use   Smoking status: Never   Smokeless tobacco: Never  Vaping Use   Vaping Use: Never used  Substance and Sexual Activity   Alcohol use: Yes    Alcohol/week: 0.0 - 1.0 standard drinks of alcohol  Comment: Occasionally   Drug use: No   Sexual activity: Yes    Partners: Male    Comment: Mirena Inserted 08/19/15  Other Topics Concern   Not on file  Social History Narrative   Not on file   Social Determinants of Health   Financial Resource Strain: Not on file  Food Insecurity: Not on file  Transportation Needs: Not on file  Physical Activity: Not on file  Stress: Not on file  Social Connections: Not on file  Intimate Partner Violence: Not on file    Review of Systems: Positive for none All other review of systems negative except as mentioned in the HPI.  Physical Exam: Vital signs in last 24 hours: '@VSRANGES'$ @   General:   Alert,  Well-developed, well-nourished, pleasant and cooperative in NAD Lungs:  Clear throughout to auscultation.   Heart:  Regular rate and rhythm; no murmurs, clicks, rubs,  or gallops. Abdomen:  Soft, nontender and nondistended. Normal bowel sounds.   Neuro/Psych:  Alert and cooperative. Normal mood and affect. A and O x 3    No significant changes were identified.  The patient continues to be an appropriate candidate for the planned procedure and anesthesia.   Carmell Austria, MD. [facility] 09/24/2021 9:43 AM@

## 2021-09-24 NOTE — Op Note (Signed)
[facility] Patient Name: Allison Soto Procedure Date: 09/24/2021 9:49 AM MRN: [id number] Endoscopist: Jackquline Denmark , MD Age: 61 Referring MD:  Date of Birth: 04/21/1960 Gender: Female Account #: [id number] Procedure:                Colonoscopy Indications:              High risk colon cancer surveillance: Personal                            history of colonic polyps Medicines:                Monitored Anesthesia Care Procedure:                Pre-Anesthesia Assessment:                           - Prior to the procedure, a History and Physical                            was performed, and patient medications and                            allergies were reviewed. The patient's tolerance of                            previous anesthesia was also reviewed. The risks                            and benefits of the procedure and the sedation                            options and risks were discussed with the patient.                            All questions were answered, and informed consent                            was obtained. Prior Anticoagulants: The patient has                            taken no previous anticoagulant or antiplatelet                            agents. ASA Grade Assessment: II - A patient with                            mild systemic disease. After reviewing the risks                            and benefits, the patient was deemed in                            satisfactory condition to undergo the procedure.  After obtaining informed consent, the colonoscope                            was passed under direct vision. Throughout the                            procedure, the patient's blood pressure, pulse, and                            oxygen saturations were monitored continuously. The                            0441 PCF-H190TL Slim SB Colonoscope was introduced                            through the anus and advanced to the  the cecum,                            identified by appendiceal orifice and ileocecal                            valve. The colonoscopy was performed without                            difficulty. The patient tolerated the procedure                            well. The quality of the bowel preparation was                            good. The terminal ileum, ileocecal valve,                            appendiceal orifice, and rectum were photographed. Scope In: 10:10:06 AM Scope Out: 10:30:02 AM Total Procedure Duration: 0 hours 19 minutes 56 seconds  Findings:                 Three sessile polyps were found in the proximal                            sigmoid colon and mid sigmoid colon at 40-45 cm                            from anal verge. The polyps were 6 to 10 mm in                            size. These polyps were removed with a hot snare.                            Resection and retrieval were complete.                           Multiple medium-mouthed diverticula were found in  the sigmoid colon.                           Non-bleeding internal hemorrhoids were found during                            retroflexion. The hemorrhoids were small.                           The exam was otherwise without abnormality on                            direct and retroflexion views. Complications:            No immediate complications. Estimated Blood Loss:     Estimated blood loss: none. Impression:               - Three 6 to 10 mm polyps in the proximal sigmoid                            colon and in the mid sigmoid colon, removed with a                            hot snare. Resected and retrieved.                           - Diverticulosis in the sigmoid colon.                           - One polyp.                           - Non-bleeding internal hemorrhoids.                           - The examination was otherwise normal on direct                            and  retroflexion views. Recommendation:           - Patient has a contact number available for                            emergencies. The signs and symptoms of potential                            delayed complications were discussed with the                            patient. Return to normal activities tomorrow.                            Written discharge instructions were provided to the                            patient.                           -  Resume previous diet.                           - Continue present medications.                           - Await pathology results.                           - No ibuprofen, naproxen, or other non-steroidal                            anti-inflammatory drugs for 5 days after polyp                            removal.                           - The findings and recommendations were discussed                            with the patient's family. Jackquline Denmark, MD 09/24/2021 10:43:36 AM This report has been signed electronically.

## 2021-09-24 NOTE — Patient Instructions (Addendum)
Handouts provided about diverticulosis and polyps.  Resume previous diet.  Continue present medications.  Take Protonix 40 mg half an hour before supper and Pepcid at bedtime and with breakfast.  Await pathology results. If further problems, further work-up.    NO ASPIRIN, ASPIRIN CONTAINING PRODUCTS (BC OR GOODY POWDERS) OR NSAIDS (IBUPROFEN, ADVIL, ALEVE, AND MOTRIN) FOR 5 days after polyp removal.      YOU HAD AN ENDOSCOPIC PROCEDURE TODAY AT [facility]:   Refer to the procedure report that was given to you for any specific questions about what was found during the examination.  If the procedure report does not answer your questions, please call your gastroenterologist to clarify.  If you requested that your care partner not be given the details of your procedure findings, then the procedure report has been included in a sealed envelope for you to review at your convenience later.  YOU SHOULD EXPECT: Some feelings of bloating in the abdomen. Passage of more gas than usual.  Walking can help get rid of the air that was put into your GI tract during the procedure and reduce the bloating. If you had a lower endoscopy (such as a colonoscopy or flexible sigmoidoscopy) you may notice spotting of blood in your stool or on the toilet paper. If you underwent a bowel prep for your procedure, you may not have a normal bowel movement for a few days.  Please Note:  You might notice some irritation and congestion in your nose or some drainage.  This is from the oxygen used during your procedure.  There is no need for concern and it should clear up in a day or so.  SYMPTOMS TO REPORT IMMEDIATELY:  Following lower endoscopy (colonoscopy or flexible sigmoidoscopy):  Excessive amounts of blood in the stool  Significant tenderness or worsening of abdominal pains  Swelling of the abdomen that is new, acute  Fever of 100F or higher  Following upper endoscopy (EGD)  Vomiting of blood or coffee  ground material  New chest pain or pain under the shoulder blades  Painful or persistently difficult swallowing  New shortness of breath  Fever of 100F or higher  Black, tarry-looking stools  For urgent or emergent issues, a gastroenterologist can be reached at any hour by calling [phone]. Do not use MyChart messaging for urgent concerns.    DIET:  We do recommend a small meal at first, but then you may proceed to your regular diet.  Drink plenty of fluids but you should avoid alcoholic beverages for 24 hours.  ACTIVITY:  You should plan to take it easy for the rest of today and you should NOT DRIVE or use heavy machinery until tomorrow (because of the sedation medicines used during the test).    FOLLOW UP: Our staff will call the number listed on your records the next business day following your procedure.  We will call around 7:15- 8:00 am to check on you and address any questions or concerns that you may have regarding the information given to you following your procedure. If we do not reach you, we will leave a message.  If you develop any symptoms (ie: fever, flu-like symptoms, shortness of breath, cough etc.) before then, please call [phone].  If you test positive for Covid 19 in the 2 weeks post procedure, please call and report this information to [place].    If any biopsies were taken you will be contacted by phone or by letter within the next  1-3 weeks.  Please call us at [phone] if you have not heard about the biopsies in 3 weeks.    SIGNATURES/CONFIDENTIALITY: You and/or your care partner have signed paperwork which will be entered into your electronic medical record.  These signatures attest to the fact that that the information above on your After Visit Summary has been reviewed and is understood.  Full responsibility of the confidentiality of this discharge information lies with you and/or your care-partner.

## 2021-09-25 ENCOUNTER — Telehealth: Payer: Self-pay

## 2021-09-25 NOTE — Telephone Encounter (Signed)
  Follow up Call-     09/24/2021    9:03 AM  Call back number  Post procedure Call Back phone  # [phone]  Permission to leave phone message Yes     Post op call attempted, no answer, left WM.

## 2021-09-27 ENCOUNTER — Encounter: Payer: Self-pay | Admitting: Gastroenterology

## 2021-09-29 ENCOUNTER — Ambulatory Visit (HOSPITAL_BASED_OUTPATIENT_CLINIC_OR_DEPARTMENT_OTHER): Payer: No Typology Code available for payment source | Admitting: Obstetrics & Gynecology

## 2021-10-06 ENCOUNTER — Other Ambulatory Visit: Payer: Self-pay | Admitting: Allergy and Immunology

## 2021-10-22 ENCOUNTER — Encounter: Payer: Self-pay | Admitting: Allergy and Immunology

## 2021-11-11 ENCOUNTER — Ambulatory Visit (HOSPITAL_BASED_OUTPATIENT_CLINIC_OR_DEPARTMENT_OTHER): Payer: No Typology Code available for payment source | Admitting: Advanced Practice Midwife

## 2022-02-26 ENCOUNTER — Other Ambulatory Visit: Payer: Self-pay

## 2022-02-26 ENCOUNTER — Encounter: Payer: Self-pay | Admitting: Hematology and Oncology

## 2022-02-26 ENCOUNTER — Inpatient Hospital Stay
Payer: No Typology Code available for payment source | Attending: Hematology and Oncology | Admitting: Hematology and Oncology

## 2022-02-26 VITALS — BP 132/65 | HR 79 | Temp 97.8°F | Resp 16 | Ht 65.0 in | Wt 138.9 lb

## 2022-02-26 DIAGNOSIS — Z7951 Long term (current) use of inhaled steroids: Secondary | ICD-10-CM | POA: Diagnosis not present

## 2022-02-26 DIAGNOSIS — C50912 Malignant neoplasm of unspecified site of left female breast: Secondary | ICD-10-CM | POA: Diagnosis present

## 2022-02-26 DIAGNOSIS — J069 Acute upper respiratory infection, unspecified: Secondary | ICD-10-CM | POA: Insufficient documentation

## 2022-02-26 DIAGNOSIS — Z170 Estrogen receptor positive status [ER+]: Secondary | ICD-10-CM | POA: Insufficient documentation

## 2022-02-26 DIAGNOSIS — Z803 Family history of malignant neoplasm of breast: Secondary | ICD-10-CM | POA: Diagnosis not present

## 2022-02-26 DIAGNOSIS — J45909 Unspecified asthma, uncomplicated: Secondary | ICD-10-CM | POA: Diagnosis not present

## 2022-02-26 DIAGNOSIS — E785 Hyperlipidemia, unspecified: Secondary | ICD-10-CM | POA: Diagnosis not present

## 2022-02-26 DIAGNOSIS — Z9189 Other specified personal risk factors, not elsewhere classified: Secondary | ICD-10-CM

## 2022-02-26 DIAGNOSIS — Z79810 Long term (current) use of selective estrogen receptor modulators (SERMs): Secondary | ICD-10-CM | POA: Diagnosis not present

## 2022-02-26 DIAGNOSIS — K219 Gastro-esophageal reflux disease without esophagitis: Secondary | ICD-10-CM | POA: Diagnosis not present

## 2022-02-26 DIAGNOSIS — Z79899 Other long term (current) drug therapy: Secondary | ICD-10-CM | POA: Insufficient documentation

## 2022-02-26 NOTE — Progress Notes (Signed)
[facility] CONSULT NOTE  Patient Care Team: Serita Grammes, MD as PCP - General (Family Medicine) Buford Dresser, MD as PCP - Cardiology (Cardiology)  CHIEF COMPLAINTS/PURPOSE OF CONSULTATION:  High risk for breast cancer.  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  She is currently on tamoxifen for prevention.   Last MRI March 2023, negative for maligancy, mammogram scheduled for August. Last mammogram Aug 2023, no evidence of malignancy. No concerns on physical exam today. She is recovering from URI. She will see her PCP in [place]. I will plan to see her in fall. MRI breast ordered for Feb, Mammogram in August.  Thank you for consulting on the care of this patient.  Please not hesitate to contact us with any additional questions or concerns  HISTORY OF PRESENTING ILLNESS:   Allison Soto 62 y.o. female is here because of high risk of breast cancer.  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  She is here for follow up on Tamoxifen. Hair thinning has significantly improved. She is working with Dr Delman Cheadle from dermatology. She denies any changes in her breast. She had a bad URI, recovering from it. Rest of the pertinent 10 point ROS reviewed and  negative.   MEDICAL HISTORY:  Past Medical History:  Diagnosis Date   Allergy    Anemia    Anxiety    Arthritis    Asthma    Depression    Family history of breast cancer    GERD (gastroesophageal reflux disease)    Hyperlipidemia    Pneumonia 1987   Pre-diabetes    Radial scar of breast    Reflux    Single cyst of left breast 02/01/12   Diagnostic mammogram and ultrasound - cyst at 2:30    SURGICAL HISTORY: Past Surgical History:  Procedure Laterality Date   BREAST LUMPECTOMY WITH RADIOACTIVE SEED LOCALIZATION Left 12/21/2019   Procedure: LEFT BREAST LUMPECTOMY WITH RADIOACTIVE SEED LOCALIZATION;  Surgeon: Erroll Luna, MD;  Location: [facility];  Service: General;  Laterality: Left;   [place] (IUD) INSERTION  08/19/2015   Mirena   POLYPECTOMY     UPPER GASTROINTESTINAL ENDOSCOPY     WISDOM TOOTH EXTRACTION      SOCIAL HISTORY: Social History   Socioeconomic History   Marital status: Married    Spouse name: Not on file   Number of children: Not on file   Years of education: Not on file   Highest education level: Not on file  Occupational History   Not on file  Tobacco Use   Smoking status: Never   Smokeless tobacco: Never  Vaping Use   Vaping Use: Never used  Substance and Sexual Activity   Alcohol use: Yes    Alcohol/week: 0.0 - 1.0 standard drinks of alcohol  Comment: Occasionally   Drug use: No   Sexual activity: Yes    Partners: Male    Comment: Mirena Inserted 08/19/15  Other Topics Concern   Not on file  Social History Narrative   Not on file   Social Determinants of Health   Financial Resource Strain: Not on file  Food Insecurity: Not on file  Transportation Needs: Not on file  Physical Activity: Not on file  Stress: Not on file  Social Connections: Not on file  Intimate Partner Violence: Not on file    FAMILY HISTORY: Family History  Problem Relation  Age of Onset   Breast cancer Mother 67   Depression Mother    Anxiety disorder Mother    Osteoporosis Mother    Thyroid disease Mother        on synthroid   Depression Sister    Anxiety disorder Sister    Stroke Father        died 02/25/2012   Dementia Father    Alzheimer's disease Paternal Aunt    Breast cancer Cousin        triple negative   Breast cancer Cousin        mom's cousin   Colon cancer Neg Hx    Colon polyps Neg Hx    Esophageal cancer Neg Hx    Rectal cancer Neg Hx    Stomach cancer Neg Hx     ALLERGIES:  is allergic to shellfish allergy and penicillins.  MEDICATIONS:  Current Outpatient Medications  Medication Sig Dispense Refill   albuterol (PROAIR HFA) 108 (90 Base) MCG/ACT inhaler Inhale 2 puffs into the lungs every 4 (four) hours as needed for wheezing or shortness of breath. 1 Inhaler 3   budesonide-formoterol (SYMBICORT) 160-4.5 MCG/ACT inhaler Inhale 2 puffs into the lungs 2 (two) times daily.     Cholecalciferol (VITAMIN D-3) 1000 units CAPS Take 1 capsule by mouth daily.     Coenzyme Q10 (CO Q 10 PO) Take by mouth.     CRESTOR 5 MG tablet Take 10 mg by mouth every evening.  12   EPINEPHrine (EPIPEN 2-PAK) 0.3 mg/0.3 mL IJ SOAJ injection Use as directed for life-threatening allergic reaction. 2 each 2   famotidine (PEPCID) 40 MG tablet TAKE 1 TABLET BY MOUTH EVERY DAY IN THE EVENING 30 tablet 5   fluconazole (DIFLUCAN) 150 MG tablet Take one tablet by mouth once a week for three weeks. 3 tablet 0   fluvoxaMINE (LUVOX) 100 MG tablet Take 100 mg by mouth at bedtime. Taking '150mg'$  daily     ketoconazole (NIZORAL) 2 % shampoo Apply topically 3 (three) times a week.     minoxidil (LONITEN) 2.5 MG tablet Take by mouth.     Multiple Vitamin (MULTIVITAMIN WITH MINERALS) TABS tablet Take 1 tablet by mouth daily.     pantoprazole (PROTONIX) 40 MG tablet Take 1 tablet (40 mg total) by mouth daily. 180 tablet 2   spironolactone (ALDACTONE) 50 MG tablet Take 50 mg by  mouth 2 (two) times daily.     tamoxifen (NOLVADEX) 20 MG tablet Take 1 tablet (20 mg total) by mouth daily. 30 tablet 11   traZODone (DESYREL) 25 mg TABS tablet Take 25 mg by mouth at bedtime.     No current facility-administered medications for this visit.   PHYSICAL EXAMINATION:  ECOG PERFORMANCE STATUS: 0 - Asymptomatic  Vital signs and physical examination not done, telehealth visit  LMP 01/23/2017   Physical Exam Constitutional:  Appearance: Normal appearance.  Cardiovascular:     Rate and Rhythm: Normal rate and regular rhythm.  Pulmonary:     Effort: Pulmonary effort is normal.     Breath sounds: Normal breath sounds.  Chest:     Comments: Bilateral breast examined.  No palpable masses or regional adenopathy Abdominal:     General: Abdomen is flat. Bowel sounds are normal.     Palpations: Abdomen is soft.  Musculoskeletal:     Cervical back: Normal range of motion and neck supple. No rigidity.  Lymphadenopathy:     Cervical: No cervical adenopathy.  Skin:    General: Skin is warm and dry.  Neurological:     General: No focal deficit present.     Mental Status: She is alert.    LABORATORY DATA:  I have reviewed the data as listed Lab Results  Component Value Date   WBC 5.4 12/18/2019   HGB 15.6 (H) 12/18/2019   HCT 47.5 (H) 12/18/2019   MCV 97.3 12/18/2019   PLT 247 12/18/2019     Chemistry      Component Value Date/Time   NA 140 12/18/2019 0930   K 5.0 12/18/2019 0930   CL 103 12/18/2019 0930   CO2 29 12/18/2019 0930   BUN 12 12/18/2019 0930   CREATININE 0.78 12/18/2019 0930      Component Value Date/Time   CALCIUM 9.4 12/18/2019 0930   ALKPHOS 44 12/18/2019 0930   AST 29 12/18/2019 0930   ALT 32 12/18/2019 0930   BILITOT 0.3 12/18/2019 0930       RADIOGRAPHIC STUDIES: I have personally reviewed the radiological images as listed and agreed with the findings in the report. No results found.  I spent 20 minutes in the care of this  patient including history, review of records, counseling about role of low-dose tamoxifen, surveillance with MRIs and mammograms and documentation.   Benay Pike, MD 02/26/2022 1:14 PM

## 2022-03-19 ENCOUNTER — Ambulatory Visit: Payer: No Typology Code available for payment source | Admitting: Allergy and Immunology

## 2022-03-19 ENCOUNTER — Encounter: Payer: Self-pay | Admitting: Allergy and Immunology

## 2022-03-19 VITALS — BP 108/80 | HR 80 | Resp 14 | Ht 64.0 in | Wt 138.4 lb

## 2022-03-19 DIAGNOSIS — Z91018 Allergy to other foods: Secondary | ICD-10-CM | POA: Diagnosis not present

## 2022-03-19 DIAGNOSIS — J4540 Moderate persistent asthma, uncomplicated: Secondary | ICD-10-CM | POA: Diagnosis not present

## 2022-03-19 DIAGNOSIS — K219 Gastro-esophageal reflux disease without esophagitis: Secondary | ICD-10-CM | POA: Diagnosis not present

## 2022-03-19 DIAGNOSIS — J3089 Other allergic rhinitis: Secondary | ICD-10-CM | POA: Diagnosis not present

## 2022-03-19 MED ORDER — AIRSUPRA 90-80 MCG/ACT IN AERO: 2.0000 | INHALATION_SPRAY | RESPIRATORY_TRACT | 1 refills | Status: AC | PRN

## 2022-03-19 NOTE — Progress Notes (Signed)
[facility]   Follow-up Note  Referring Provider: Serita Grammes, MD Primary Provider: Serita Grammes, MD Date of Office Visit: 03/19/2022  Subjective:   Allison Soto (DOB: 08/11/1960) is a 62 y.o. female who returns to the [facility] on 03/19/2022 in re-evaluation of the following:  HPI: Senika returns to this clinic in evaluation of asthma, allergic rhinitis, LPR, and shellfish allergy.  I last saw her in this clinic 18 November 2021.  Over the course of the past year she has required four systemic steroids to address her asthma condition.  Several of these flareups have been precipitated by the development of viral respiratory tract infection but she coughs forever with these viral infections.  She continues to consistently use a combination inhaler.  She uses a combination nasal antihistamine/steroid for her upper airway disease which appears to be working well.  Her reflux is under good control while using Dexilant in the evening and famotidine in the morning.  She has obtained a flu vaccine, RSV vaccine, COVID-vaccine.  She does not consume shellfish.  Allergies as of 03/19/2022       Reactions   Shellfish Allergy Anaphylaxis   Throat closes.   Penicillins Other (See Comments)   Mouth sores.        Medication List    albuterol 108 (90 Base) MCG/ACT inhaler Commonly known as: ProAir HFA Inhale 2 puffs into the lungs every 4 (four) hours as needed for wheezing or shortness of breath.   budesonide-formoterol 160-4.5 MCG/ACT inhaler Commonly known as: SYMBICORT Inhale 2 puffs into the lungs 2 (two) times daily.   CO Q 10 PO Take by mouth.   dexlansoprazole 60 MG capsule Commonly known as: DEXILANT Take 60 mg by mouth daily.   EPINEPHrine 0.3 mg/0.3 mL Soaj injection Commonly known as: EpiPen 2-Pak Use as directed for life-threatening allergic reaction.   famotidine 40 MG  tablet Commonly known as: PEPCID TAKE 1 TABLET BY MOUTH EVERY DAY IN THE EVENING   fluvoxaMINE 100 MG tablet Commonly known as: LUVOX Take 100 mg by mouth at bedtime.   ipratropium-albuterol 0.5-2.5 (3) MG/3ML Soln Commonly known as: DUONEB SMARTSIG:3 Milliliter(s) Via Nebulizer Every 6 Hours PRN   ketoconazole 2 % shampoo Commonly known as: NIZORAL Apply topically 3 (three) times a week.   MAGNESIUM PO Take by mouth daily.   minoxidil 2.5 MG tablet Commonly known as: LONITEN Take by mouth.   multivitamin with minerals Tabs tablet Take 1 tablet by mouth daily.   Ryaltris [id number] MCG/ACT Susp Generic drug: Olopatadine-Mometasone Place 2 sprays into both nostrils 2 (two) times daily.   spironolactone 50 MG tablet Commonly known as: ALDACTONE Take 50 mg by mouth 2 (two) times daily.   tamoxifen 20 MG tablet Commonly known as: NOLVADEX Take 1 tablet (20 mg total) by mouth daily.   traZODone 25 mg Tabs tablet Commonly known as: DESYREL Take 25 mg by mouth at bedtime.   Vitamin D-3 25 MCG (1000 UT) Caps Take 1 capsule by mouth daily.    Past Medical History:  Diagnosis Date   Allergy    Anemia    Anxiety    Arthritis    Asthma    Depression    Family history of breast cancer    GERD (gastroesophageal reflux disease)    Hyperlipidemia    Pneumonia 1987   Pre-diabetes    Radial scar of breast    Reflux  Single cyst of left breast 02/01/12   Diagnostic mammogram and ultrasound - cyst at 2:30    Past Surgical History:  Procedure Laterality Date   BREAST LUMPECTOMY WITH RADIOACTIVE SEED LOCALIZATION Left 12/21/2019   Procedure: LEFT BREAST LUMPECTOMY WITH RADIOACTIVE SEED LOCALIZATION;  Surgeon: Erroll Luna, MD;  Location: [facility];  Service: General;  Laterality: Left;   [place] (IUD) INSERTION  08/19/2015   Mirena   POLYPECTOMY     UPPER GASTROINTESTINAL ENDOSCOPY      WISDOM TOOTH EXTRACTION      Review of systems negative except as noted in HPI / PMHx or noted below:  Review of Systems  Constitutional: Negative.   HENT: Negative.    Eyes: Negative.   Respiratory: Negative.    Cardiovascular: Negative.   Gastrointestinal: Negative.   Genitourinary: Negative.   Musculoskeletal: Negative.   Skin: Negative.   Neurological: Negative.   Endo/Heme/Allergies: Negative.   Psychiatric/Behavioral: Negative.       Objective:   Vitals:   03/19/22 1009  BP: 108/80  Pulse: 80  Resp: 14  SpO2: 96%   Height: 5' 4"$  (162.6 cm)  Weight: 138 lb 6.4 oz (62.8 kg)   Physical Exam Constitutional:      Appearance: She is not diaphoretic.  HENT:     Head: Normocephalic.     Right Ear: Tympanic membrane, ear canal and external ear normal.     Left Ear: Tympanic membrane, ear canal and external ear normal.     Nose: Nose normal. No mucosal edema or rhinorrhea.     Mouth/Throat:     Pharynx: Uvula midline. No oropharyngeal exudate.  Eyes:     Conjunctiva/sclera: Conjunctivae normal.  Neck:     Thyroid: No thyromegaly.     Trachea: Trachea normal. No tracheal tenderness or tracheal deviation.  Cardiovascular:     Rate and Rhythm: Normal rate and regular rhythm.     Heart sounds: Normal heart sounds, S1 normal and S2 normal. No murmur heard. Pulmonary:     Effort: No respiratory distress.     Breath sounds: Normal breath sounds. No stridor. No wheezing or rales.  Lymphadenopathy:     Head:     Right side of head: No tonsillar adenopathy.     Left side of head: No tonsillar adenopathy.     Cervical: No cervical adenopathy.  Skin:    Findings: No erythema or rash.     Nails: There is no clubbing.  Neurological:     Mental Status: She is alert.     Diagnostics:    Spirometry was performed and demonstrated an FEV1 of 2.0 at 82 % of predicted.  Assessment and Plan:   1. Not well controlled moderate persistent asthma   2. Perennial allergic  rhinitis   3. LPRD (laryngopharyngeal reflux disease)   4. Food allergy    1.  Treat and prevent inflammation:  A. Symbicort 160 - 2 inhalations 2 times per day (empty lungs) B. Ryaltris - 2 spray each nostril 1-2 times per day  2.  Treat and prevent reflux/LPR:  A. Dexilant 60 mg in PM B. Famotidine 40 mg - 1 tablet in AM C. Replace throat clearing with swallowing/drinking maneuver  3.  If needed:  A. AirSupra - 2 inhalations every 4-6 hours (Coupon) B. Mucinex DM - 2 times per day C. Carbinoxamine 4 mg - 1-2 tablets 1-2 times per day D.  Epi-pen  4.  Blood - CBC w/d, IgE. Biologic agent???  5.  Return to clinic in 6 months or earlier if problem  Danniel does not have good control of her asthma and we will see if she is eligible for a biologic agent.  Will check the blood test noted above.  She will continue to use anti-inflammatory agents for both her upper and lower airway.  Fortunately it does sound as though her reflux under pretty good control on her current plan.  I will contact her with the results of her blood test once they are available for review.  Allena Katz, MD Allergy / Immunology [facility]

## 2022-03-19 NOTE — Patient Instructions (Addendum)
  1.  Treat and prevent inflammation:  A. Symbicort 160 - 2 inhalations 2 times per day (empty lungs) B. Ryaltris - 2 spray each nostril 1-2 times per day  2.  Treat and prevent reflux/LPR:  A. Dexilant 60 mg in PM B. Famotidine 40 mg - 1 tablet in AM C. Replace throat clearing with swallowing/drinking maneuver  3.  If needed:  A. AirSupra - 2 inhalations every 4-6 hours (Coupon) B. Mucinex DM - 2 times per day C. Carbinoxamine 4 mg - 1-2 tablets 1-2 times per day D. Epi-pen  4.  Blood - CBC w/d, IgE. Biologic agent???  5.  Return to clinic in 6 months or earlier if problem

## 2022-03-22 LAB — CBC WITH DIFFERENTIAL/PLATELET
Basophils Absolute: 0 10*3/uL (ref 0.0–0.2)
Basos: 0 %
EOS (ABSOLUTE): 0 10*3/uL (ref 0.0–0.4)
Eos: 0 %
Hematocrit: 44.8 % (ref 34.0–46.6)
Hemoglobin: 14.9 g/dL (ref 11.1–15.9)
Immature Grans (Abs): 0 10*3/uL (ref 0.0–0.1)
Immature Granulocytes: 0 %
Lymphocytes Absolute: 1.6 10*3/uL (ref 0.7–3.1)
Lymphs: 18 %
MCH: 30.6 pg (ref 26.6–33.0)
MCHC: 33.3 g/dL (ref 31.5–35.7)
MCV: 92 fL (ref 79–97)
Monocytes Absolute: 0.7 10*3/uL (ref 0.1–0.9)
Monocytes: 7 %
Neutrophils Absolute: 6.8 10*3/uL (ref 1.4–7.0)
Neutrophils: 75 %
Platelets: 248 10*3/uL (ref 150–450)
RBC: 4.87 x10E6/uL (ref 3.77–5.28)
RDW: 11.9 % (ref 11.7–15.4)
WBC: 9.1 10*3/uL (ref 3.4–10.8)

## 2022-03-22 LAB — IGE: IgE (Immunoglobulin E), Serum: 2 IU/mL — ABNORMAL LOW (ref 6–495)

## 2022-03-23 ENCOUNTER — Encounter: Payer: Self-pay | Admitting: Allergy and Immunology

## 2022-03-31 ENCOUNTER — Encounter: Payer: Self-pay | Admitting: Allergy and Immunology

## 2022-04-07 ENCOUNTER — Telehealth: Payer: Self-pay | Admitting: *Deleted

## 2022-04-07 NOTE — Telephone Encounter (Signed)
Patient advised approval copay card and submit to [facility] for Tezspire pen. Instructed on delivery,storage and initial dose in clinic

## 2022-04-07 NOTE — Telephone Encounter (Signed)
-----   Message from Jiles Prows, MD sent at 03/30/2022  6:39 AM EST ----- Please inform Allison Soto that we can start her on Tezepelumab.

## 2022-04-16 ENCOUNTER — Ambulatory Visit: Payer: No Typology Code available for payment source | Admitting: *Deleted

## 2022-04-16 DIAGNOSIS — J4550 Severe persistent asthma, uncomplicated: Secondary | ICD-10-CM

## 2022-04-16 NOTE — Progress Notes (Signed)
Started Tezspire autoinjector injections today. Will continue injections at home every 4 weeks. Was demonstrated how to self administer then [facility] self administered her Tezspire.

## 2022-05-11 ENCOUNTER — Other Ambulatory Visit: Payer: No Typology Code available for payment source

## 2022-06-01 ENCOUNTER — Ambulatory Visit
Admission: RE | Admit: 2022-06-01 | Discharge: 2022-06-01 | Disposition: A | Discharge status: Home / Self Care | Payer: No Typology Code available for payment source | Source: Ambulatory Visit | Attending: Hematology and Oncology | Admitting: Hematology and Oncology

## 2022-06-01 DIAGNOSIS — Z9189 Other specified personal risk factors, not elsewhere classified: Secondary | ICD-10-CM

## 2022-06-01 MED ORDER — GADOPICLENOL 0.5 MMOL/ML IV SOLN
6.0000 mL | Freq: Once | INTRAVENOUS | Status: AC | PRN
  Administered 2022-06-01: 6 mL via INTRAVENOUS

## 2022-06-29 ENCOUNTER — Encounter: Payer: Self-pay | Admitting: Allergy and Immunology

## 2022-06-29 ENCOUNTER — Ambulatory Visit (INDEPENDENT_AMBULATORY_CARE_PROVIDER_SITE_OTHER): Payer: No Typology Code available for payment source | Admitting: Allergy and Immunology

## 2022-06-29 VITALS — BP 106/64 | HR 74 | Temp 98.0°F | Resp 16

## 2022-06-29 DIAGNOSIS — B349 Viral infection, unspecified: Secondary | ICD-10-CM

## 2022-06-29 DIAGNOSIS — J3089 Other allergic rhinitis: Secondary | ICD-10-CM | POA: Diagnosis not present

## 2022-06-29 DIAGNOSIS — Z91018 Allergy to other foods: Secondary | ICD-10-CM

## 2022-06-29 DIAGNOSIS — K219 Gastro-esophageal reflux disease without esophagitis: Secondary | ICD-10-CM | POA: Diagnosis not present

## 2022-06-29 DIAGNOSIS — J4550 Severe persistent asthma, uncomplicated: Secondary | ICD-10-CM

## 2022-06-29 NOTE — Patient Instructions (Addendum)
  1.  Treat and prevent inflammation:  A. Symbicort 160 - 2 inhalations 2 times per day (empty lungs) B. Ryaltris - 2 spray each nostril 1-2 times per day C. Tezepelumab injections every 28 days  2.  Treat and prevent reflux/LPR:  A. Dexilant 60 mg in PM B. Famotidine 40 mg - 1 tablet in AM C. Replace throat clearing with swallowing/drinking maneuver  3.  If needed:  A. AirSupra - 2 inhalations every 4-6 hours B. Mucinex DM - 2 times per day C. Carbinoxamine 4 mg - 1-2 tablets 1-2 times per day D. Epi-pen  4. For this event:   A. Prednisone 10 mg - 1 tablet 1 time per day for 10 days  5.  Return to clinic in 6 months or earlier if problem

## 2022-06-29 NOTE — Progress Notes (Unsigned)
[place] - [place] - [place] - [place] - [place]   Follow-up Note  Referring Provider: Buckner Malta, MD Primary Provider: Buckner Malta, MD Date of Office Visit: 06/29/2022  Subjective:   Allison Soto (DOB: Sep 21, 1960) is a 62 y.o. female [facility] in re-evaluation of the following:  HPI: Allison Soto turns to this clinic in evaluation of asthma, allergic rhinitis, LPR, and shellfish allergy.  I last saw her in this clinic 19 March 2022.  Allison Soto was doing quite well until approximately 4 5 days ago when Allison Soto had acute onset of sore throat and then had congestion and then coughing and wheezing.  Allison Soto is making mucus but it is not colored.  Allison Soto has no chest pain or anosmia or ugly nasal discharge or headache or fever.  Allison Soto did check 2 COVID swabs which have been negative.  Prior to this event Allison Soto was doing very well and rarely used any short acting bronchodilator when Allison Soto continued on a collection of anti-inflammatory agents for airway including use of anti-TSL P antibody.  Allison Soto did not have any problems with her reflux which is under very good control with a combination of proton pump inhibitor and H2 receptor blocker.  Allison Soto remains away from shellfish consumption.  Allergies as of 06/29/2022       Reactions   Shellfish Allergy Anaphylaxis   Throat closes.   Penicillins Other (See Comments)   Mouth sores.        Medication List    Airsupra 90-80 MCG/ACT Aero Generic drug: Albuterol-Budesonide Inhale 2 puffs into the lungs as needed (every four to six hours for cough, wheeze, shortness of breath.  Rinse, gargle, and spit after use).   albuterol 108 (90 Base) MCG/ACT inhaler Commonly known as: ProAir HFA Inhale 2 puffs into the lungs every 4 (four) hours as needed for wheezing or shortness of breath.   budesonide-formoterol 160-4.5 MCG/ACT inhaler Commonly known as: SYMBICORT Inhale 2 puffs into the  lungs 2 (two) times daily.   CO Q 10 PO Take by mouth.   Crestor 10 MG tablet Generic drug: rosuvastatin   dexlansoprazole 60 MG capsule Commonly known as: DEXILANT Take 60 mg by mouth daily.   EPINEPHrine 0.3 mg/0.3 mL Soaj injection Commonly known as: EpiPen 2-Pak Use as directed for life-threatening allergic reaction.   famotidine 40 MG tablet Commonly known as: PEPCID TAKE 1 TABLET BY MOUTH EVERY DAY IN THE EVENING   fluvoxaMINE 100 MG tablet Commonly known as: LUVOX Take 100 mg by mouth at bedtime.   ipratropium-albuterol 0.5-2.5 (3) MG/3ML Soln Commonly known as: DUONEB SMARTSIG:3 Milliliter(s) Via Nebulizer Every 6 Hours PRN   ketoconazole 2 % shampoo Commonly known as: NIZORAL Apply topically 3 (three) times a week.   MAGNESIUM PO Take by mouth daily.   minoxidil 2.5 MG tablet Commonly known as: LONITEN Take by mouth.   multivitamin with minerals Tabs tablet Take 1 tablet by mouth daily.   Ryaltris 308-65 MCG/ACT Susp Generic drug: Olopatadine-Mometasone Place 2 sprays into both nostrils 2 (two) times daily.   spironolactone 50 MG tablet Commonly known as: ALDACTONE Take 50 mg by mouth 2 (two) times daily.   tamoxifen 20 MG tablet Commonly known as: NOLVADEX Take 1 tablet (20 mg total) by mouth daily.   Tezspire 210 MG/1. Soaj Generic drug: Tezepelumab-ekko Inject into the skin.   traZODone 25 mg Tabs tablet Commonly known as: DESYREL Take 25 mg by mouth at bedtime.  Vitamin D-3 25 MCG (1000 UT) Caps Take 1 capsule by mouth daily.    Past Medical History:  Diagnosis Date   Allergy    Anemia    Anxiety    Arthritis    Asthma    Depression    Family history of breast cancer    GERD (gastroesophageal reflux disease)    Hyperlipidemia    Pneumonia 1987   Pre-diabetes    Radial scar of breast    Reflux    Single cyst of left breast 02/01/12   Diagnostic mammogram and ultrasound - cyst at 2:30    Past Surgical History:   Procedure Laterality Date   BREAST LUMPECTOMY WITH RADIOACTIVE SEED LOCALIZATION Left 12/21/2019   Procedure: LEFT BREAST LUMPECTOMY WITH RADIOACTIVE SEED LOCALIZATION;  Surgeon: Harriette Bouillon, MD;  Location: [facility];  Service: General;  Laterality: Left;   CESAREAN SECTION  1993, 1995   COLONOSCOPY     INTRAUTERINE DEVICE (IUD) INSERTION  08/19/2015   Mirena   POLYPECTOMY     UPPER GASTROINTESTINAL ENDOSCOPY     WISDOM TOOTH EXTRACTION      Review of systems negative except as noted in HPI / PMHx or noted below:  ROS   Objective:   Vitals:   06/29/22 1104  BP: 106/64  Pulse: 74  Resp: 16  Temp: 98 F (36.7 C)  SpO2: 97%          Physical Exam Constitutional:      Appearance: Allison Soto is not diaphoretic.     Comments: Slightly raspy voice  HENT:     Head: Normocephalic.     Right Ear: Tympanic membrane, ear canal and external ear normal.     Left Ear: Tympanic membrane, ear canal and external ear normal.     Nose: Mucosal edema present. No rhinorrhea.     Mouth/Throat:     Pharynx: Uvula midline. No oropharyngeal exudate.  Eyes:     Conjunctiva/sclera: Conjunctivae normal.  Neck:     Thyroid: No thyromegaly.     Trachea: Trachea normal. No tracheal tenderness or tracheal deviation.  Cardiovascular:     Rate and Rhythm: Normal rate and regular rhythm.     Heart sounds: Normal heart sounds, S1 normal and S2 normal. No murmur heard. Pulmonary:     Effort: No respiratory distress.     Breath sounds: No stridor. Wheezing (bilateral expiratory wheezes) present. No rales.  Lymphadenopathy:     Head:     Right side of head: No tonsillar adenopathy.     Left side of head: No tonsillar adenopathy.     Cervical: No cervical adenopathy.  Skin:    Findings: No erythema or rash.     Nails: There is no clubbing.  Neurological:     Mental Status: Allison Soto is alert.     Diagnostics:    Spirometry was performed and demonstrated an FEV1 of 2.05 at 85 % of  predicted.  Assessment and Plan:   1. Not well controlled severe persistent asthma   2. Perennial allergic rhinitis   3. LPRD (laryngopharyngeal reflux disease)   4. Food allergy   5. Viral infection    1.  Treat and prevent inflammation:  A. Symbicort 160 - 2 inhalations 2 times per day (empty lungs) B. Ryaltris - 2 spray each nostril 1-2 times per day C. Tezepelumab injections every 28 days  2.  Treat and prevent reflux/LPR:  A. Dexilant 60 mg in PM B. Famotidine 40 mg -  1 tablet in AM C. Replace throat clearing with swallowing/drinking maneuver  3.  If needed:  A. AirSupra - 2 inhalations every 4-6 hours B. Mucinex DM - 2 times per day C. Carbinoxamine 4 mg - 1-2 tablets 1-2 times per day D. Epi-pen  4. For this event:   A. Prednisone 10 mg - 1 tablet 1 time per day for 10 days  5.  Return to clinic in 6 months or earlier if problem  Nada appears to have a viral respiratory tract infection that is giving rise to inflammation of her airway.  Hopefully on her current plan which includes anti-TSL P antibody this episode will be truncated and Allison Soto will not have the prolonged cough that Allison Soto usually develops with these types of events.  Will see how Allison Soto does on the plan noted above and if Allison Soto does well I will see her back in this clinic in 6 months or earlier if there is a problem.  Allison Schimke, MD Allergy / Immunology [facility]

## 2022-06-30 ENCOUNTER — Other Ambulatory Visit: Payer: Self-pay | Admitting: *Deleted

## 2022-06-30 ENCOUNTER — Encounter: Payer: Self-pay | Admitting: Allergy and Immunology

## 2022-06-30 MED ORDER — TAMOXIFEN CITRATE 20 MG PO TABS: 20.0000 mg | ORAL_TABLET | Freq: Every day | ORAL | 11 refills | Status: DC

## 2022-07-07 ENCOUNTER — Encounter: Payer: Self-pay | Admitting: Allergy and Immunology

## 2022-07-07 ENCOUNTER — Other Ambulatory Visit: Payer: Self-pay | Admitting: *Deleted

## 2022-07-07 MED ORDER — FLUCONAZOLE 150 MG PO TABS: 150.0000 mg | ORAL_TABLET | Freq: Every day | ORAL | 0 refills | Status: AC

## 2022-07-20 ENCOUNTER — Encounter: Payer: Self-pay | Admitting: Hematology and Oncology

## 2022-09-01 ENCOUNTER — Telehealth: Payer: Self-pay | Admitting: Hematology and Oncology

## 2022-09-01 NOTE — Telephone Encounter (Signed)
Left patient a message regarding rescheduled appointment times/dates due to provider being out of office

## 2022-09-10 ENCOUNTER — Other Ambulatory Visit (HOSPITAL_COMMUNITY): Payer: Self-pay

## 2022-09-16 ENCOUNTER — Ambulatory Visit: Payer: No Typology Code available for payment source | Admitting: Hematology and Oncology

## 2022-09-17 ENCOUNTER — Ambulatory Visit: Payer: No Typology Code available for payment source | Admitting: Allergy and Immunology

## 2022-10-28 ENCOUNTER — Encounter: Payer: Self-pay | Admitting: Diagnostic Neuroimaging

## 2022-10-28 ENCOUNTER — Telehealth: Payer: Self-pay | Admitting: Diagnostic Neuroimaging

## 2022-10-28 ENCOUNTER — Ambulatory Visit (INDEPENDENT_AMBULATORY_CARE_PROVIDER_SITE_OTHER): Payer: No Typology Code available for payment source | Admitting: Diagnostic Neuroimaging

## 2022-10-28 VITALS — BP 108/72 | HR 79 | Ht 65.0 in | Wt 141.0 lb

## 2022-10-28 DIAGNOSIS — R200 Anesthesia of skin: Secondary | ICD-10-CM | POA: Diagnosis not present

## 2022-10-28 DIAGNOSIS — G249 Dystonia, unspecified: Secondary | ICD-10-CM

## 2022-10-28 DIAGNOSIS — R251 Tremor, unspecified: Secondary | ICD-10-CM

## 2022-10-28 NOTE — Telephone Encounter (Signed)
sent to GI they obtain Drucie Opitz [phone]

## 2022-10-28 NOTE — Progress Notes (Signed)
[facility]  PATIENT: Allison Soto DOB: 1960-09-12  REFERRING CLINICIAN: Buckner Malta, MD HISTORY FROM: patient  REASON FOR VISIT: new consult   HISTORICAL  CHIEF COMPLAINT:  Chief Complaint  Patient presents with   Room 6    Pt is here Alone. Pt states that both of her hands will contort that rarely happens. Pt states that her toes will lock and push forward which causes cramping. Pt states that she has to push her toes back in line in order for them to stop cramping up and locking. Pt states that she was having a lot of cramping in her legs and groin that would wake her up at night. Pt states that this past month her bottom lip will go numb.     HISTORY OF PRESENT ILLNESS:   62 year old female here for evaluation of abnormal muscle cramps.  Symptoms started around 2022.  She has had 5 episodes lasting 1 minute each of uncomfortable painful left hand contortion, cramping, dystonia.  She is also had almost daily nighttime cramping and dystonia in her bilateral feet and toes, left worse than right side.  Patient shares a video clip of these during our visit today.  No specific triggering or aggravating factors.  These occur randomly.  They can occur at rest or with activity.  Of note patient has been on Crestor for 3 years, increased dose around onset of these muscle cramps.  More recently patient has reduced the dose of Crestor and symptoms seem to be improving.  Patient also has noticed some intermittent lower lip numbness.  She also has some mild postural tremors in hands that are intermittent.  Patient has sister with cervical dystonia, treated with Botox for last 15 years.  Patient's mother has essential tremor and dementia.   REVIEW OF SYSTEMS: Full 14 system review of systems performed and negative with exception of: as per HPI.  ALLERGIES: Allergies  Allergen Reactions   Shellfish Allergy Anaphylaxis    Throat closes.   Penicillins Other  (See Comments)    Mouth sores.    HOME MEDICATIONS: Outpatient Medications Prior to Visit  Medication Sig Dispense Refill   albuterol (PROAIR HFA) 108 (90 Base) MCG/ACT inhaler Inhale 2 puffs into the lungs every 4 (four) hours as needed for wheezing or shortness of breath. 1 Inhaler 3   Albuterol-Budesonide (AIRSUPRA) 90-80 MCG/ACT AERO Inhale 2 puffs into the lungs as needed (every four to six hours for cough, wheeze, shortness of breath.  Rinse, gargle, and spit after use). 10.7 g 1   budesonide-formoterol (SYMBICORT) 160-4.5 MCG/ACT inhaler Inhale 2 puffs into the lungs 2 (two) times daily.     Coenzyme Q10 (CO Q 10 PO) Take by mouth.     CRESTOR 10 MG tablet      dexlansoprazole (DEXILANT) 60 MG capsule Take 60 mg by mouth daily.     EPINEPHrine (EPIPEN 2-PAK) 0.3 mg/0.3 mL IJ SOAJ injection Use as directed for life-threatening allergic reaction. 2 each 2   famotidine (PEPCID) 40 MG tablet TAKE 1 TABLET BY MOUTH EVERY DAY IN THE EVENING 30 tablet 5   fluvoxaMINE (LUVOX) 100 MG tablet Take 100 mg by mouth at bedtime.     ipratropium-albuterol (DUONEB) 0.5-2.5 (3) MG/3ML SOLN SMARTSIG:3 Milliliter(s) Via Nebulizer Every 6 Hours PRN     ketoconazole (NIZORAL) 2 % shampoo Apply topically 3 (three) times a week.     minoxidil (LONITEN) 2.5 MG tablet Take by mouth.     Multiple Vitamin (  MULTIVITAMIN WITH MINERALS) TABS tablet Take 1 tablet by mouth daily.     RYALTRIS [id number] MCG/ACT SUSP Place 2 sprays into both nostrils 2 (two) times daily.     spironolactone (ALDACTONE) 50 MG tablet Take 50 mg by mouth 2 (two) times daily.     tamoxifen (NOLVADEX) 20 MG tablet Take 1 tablet (20 mg total) by mouth daily. 30 tablet 11   TEZSPIRE 210 MG/1. SOAJ Inject into the skin.     traZODone (DESYREL) 25 mg TABS tablet Take 25 mg by mouth at bedtime.     Cholecalciferol (VITAMIN D-3) 1000 units CAPS Take 1 capsule by mouth daily. (Patient not taking: Reported on 10/28/2022)     MAGNESIUM PO Take by  mouth daily. (Patient not taking: Reported on 10/28/2022)     No facility-administered medications prior to visit.    PAST MEDICAL HISTORY: Past Medical History:  Diagnosis Date   Allergy    Anemia    Anxiety    Arthritis    Asthma    Depression    Family history of breast cancer    GERD (gastroesophageal reflux disease)    Hyperlipidemia    Pneumonia 1987   Pre-diabetes    Radial scar of breast    Reflux    Single cyst of left breast 02/01/12   Diagnostic mammogram and ultrasound - cyst at 2:30    PAST SURGICAL HISTORY: Past Surgical History:  Procedure Laterality Date   BREAST LUMPECTOMY WITH RADIOACTIVE SEED LOCALIZATION Left 12/21/2019   Procedure: LEFT BREAST LUMPECTOMY WITH RADIOACTIVE SEED LOCALIZATION;  Surgeon: Harriette Bouillon, MD;  Location: [facility];  Service: General;  Laterality: Left;   CESAREAN SECTION  1993, 1995   COLONOSCOPY     INTRAUTERINE DEVICE (IUD) INSERTION  08/19/2015   Mirena   POLYPECTOMY     UPPER GASTROINTESTINAL ENDOSCOPY     WISDOM TOOTH EXTRACTION      FAMILY HISTORY: Family History  Problem Relation Age of Onset   Breast cancer Mother 34   Depression Mother    Anxiety disorder Mother    Osteoporosis Mother    Thyroid disease Mother        on synthroid   Depression Sister    Anxiety disorder Sister    Stroke Father        died 02/24/2012   Dementia Father    Alzheimer's disease Paternal Aunt    Breast cancer Cousin        triple negative   Breast cancer Cousin        mom's cousin   Colon cancer Neg Hx    Colon polyps Neg Hx    Esophageal cancer Neg Hx    Rectal cancer Neg Hx    Stomach cancer Neg Hx     SOCIAL HISTORY: Social History   Socioeconomic History   Marital status: Married    Spouse name: Not on file   Number of children: Not on file   Years of education: Not on file   Highest education level: Not on file  Occupational History   Not on file  Tobacco Use   Smoking status: Never    Smokeless tobacco: Never  Vaping Use   Vaping status: Never Used  Substance and Sexual Activity   Alcohol use: Yes    Alcohol/week: 0.0 - 1.0 standard drinks of alcohol    Comment: Occasionally   Drug use: No   Sexual activity: Yes    Partners: Male    Comment:  Mirena Inserted 08/19/15  Other Topics Concern   Not on file  Social History Narrative   Right Handed    1 Soda per Day    Social Determinants of Health   Financial Resource Strain: Not on file  Food Insecurity: Not on file  Transportation Needs: Not on file  Physical Activity: Not on file  Stress: Not on file  Social Connections: Not on file  Intimate Partner Violence: Not on file     PHYSICAL EXAM  GENERAL EXAM/CONSTITUTIONAL: Vitals:  Vitals:   10/28/22 1015  BP: 108/72  Pulse: 79  Weight: 141 lb (64 kg)  Height: 5\' 5"  (1.651 m)   Body mass index is 23.46 kg/m. Wt Readings from Last 3 Encounters:  10/28/22 141 lb (64 kg)  03/19/22 138 lb 6.4 oz (62.8 kg)  02/26/22 138 lb 14.4 oz (63 kg)   Patient is in no distress; well developed, nourished and groomed; neck is supple  CARDIOVASCULAR: Examination of carotid arteries is normal; no carotid bruits Regular rate and rhythm, no murmurs Examination of peripheral vascular system by observation and palpation is normal  EYES: Ophthalmoscopic exam of optic discs and posterior segments is normal; no papilledema or hemorrhages No results found.  MUSCULOSKELETAL: Gait, strength, tone, movements noted in Neurologic exam below  NEUROLOGIC: MENTAL STATUS:      No data to display         awake, alert, oriented to person, place and time recent and remote memory intact normal attention and concentration language fluent, comprehension intact, naming intact fund of knowledge appropriate  CRANIAL NERVE:  2nd - no papilledema on fundoscopic exam 2nd, 3rd, 4th, 6th - pupils equal and reactive to light, visual fields full to confrontation, extraocular  muscles intact, no nystagmus 5th - facial sensation symmetric 7th - facial strength symmetric 8th - hearing intact 9th - palate elevates symmetrically, uvula midline 11th - shoulder shrug symmetric 12th - tongue protrusion midline  MOTOR:  normal bulk and tone, full strength in the BUE, BLE MINIMAL POSTURAL TREMOR IN BUE [PATIENT SHARED VIDEO OF DYSTONIA IN LEFT HAND AND RIGHT FOOT]  SENSORY:  normal and symmetric to light touch, temperature, vibration  COORDINATION:  finger-nose-finger, fine finger movements normal  REFLEXES:  deep tendon reflexes present and symmetric  GAIT/STATION:  narrow based gait     DIAGNOSTIC DATA (LABS, IMAGING, TESTING) - I reviewed patient records, labs, notes, testing and imaging myself where available.  Lab Results  Component Value Date   WBC 9.1 03/19/2022   HGB 14.9 03/19/2022   HCT 44.8 03/19/2022   MCV 92 03/19/2022   PLT 248 03/19/2022      Component Value Date/Time   NA 140 12/18/2019 0930   K 5.0 12/18/2019 0930   CL 103 12/18/2019 0930   CO2 29 12/18/2019 0930   GLUCOSE 106 (H) 12/18/2019 0930   BUN 12 12/18/2019 0930   CREATININE 0.78 12/18/2019 0930   CALCIUM 9.4 12/18/2019 0930   PROT 6.3 (L) 12/18/2019 0930   ALBUMIN 3.7 12/18/2019 0930   AST 29 12/18/2019 0930   ALT 32 12/18/2019 0930   ALKPHOS 44 12/18/2019 0930   BILITOT 0.3 12/18/2019 0930   GFRNONAA >60 12/18/2019 0930   No results found for: "CHOL", "HDL", "LDLCALC", "LDLDIRECT", "TRIG", "CHOLHDL" Lab Results  Component Value Date   HGBA1C 6.0 (H) 09/22/2013   Lab Results  Component Value Date   VITAMINB12 613 09/22/2013   Lab Results  Component Value Date   TSH 1.222 09/22/2013  ASSESSMENT AND PLAN  62 y.o. year old female here with:  Dx:  1. Dystonia   2. Tremor   3. Lip numbness     PLAN:  INTERMITTENT DYSTONIA (left arm, bilateral feet; since ~ 2022) - check MRI brain / cervical spine (rule out demyelinating disease,  stroke, myelopathy) - consider EMG/NCS - consider to change crestor to another statin - consider trial of carbidopa / levodopa  Orders Placed This Encounter  Procedures   MR BRAIN W WO CONTRAST   MR CERVICAL SPINE W WO CONTRAST   Return for pending test results, pending if symptoms worsen or fail to improve.    Suanne Marker, MD 10/28/2022, 11:10 AM Certified in Neurology, Neurophysiology and Neuroimaging  [facility] [address] [place], [place] [address] [phone]

## 2022-10-28 NOTE — Patient Instructions (Signed)
INTERMITTENT DYSTONIA (left arm, bilateral feet; since ~ 2022) - check MRI brain / cervical spine - consider EMG/NCS - consider to change crestor to another statin - consider trial of carbidopa / levodopa

## 2022-10-29 ENCOUNTER — Ambulatory Visit: Payer: No Typology Code available for payment source | Admitting: Hematology and Oncology

## 2022-11-13 ENCOUNTER — Inpatient Hospital Stay
Payer: No Typology Code available for payment source | Attending: Hematology and Oncology | Admitting: Hematology and Oncology

## 2022-11-13 ENCOUNTER — Ambulatory Visit
Admission: RE | Admit: 2022-11-13 | Discharge: 2022-11-13 | Disposition: A | Discharge status: Home / Self Care | Payer: No Typology Code available for payment source | Source: Ambulatory Visit | Attending: Hematology and Oncology | Admitting: Hematology and Oncology

## 2022-11-13 VITALS — BP 121/62 | HR 81 | Temp 97.7°F | Resp 17 | Ht 65.0 in | Wt 142.4 lb

## 2022-11-13 DIAGNOSIS — Z803 Family history of malignant neoplasm of breast: Secondary | ICD-10-CM | POA: Diagnosis not present

## 2022-11-13 DIAGNOSIS — Z79810 Long term (current) use of selective estrogen receptor modulators (SERMs): Secondary | ICD-10-CM | POA: Insufficient documentation

## 2022-11-13 DIAGNOSIS — K219 Gastro-esophageal reflux disease without esophagitis: Secondary | ICD-10-CM | POA: Diagnosis not present

## 2022-11-13 DIAGNOSIS — R232 Flushing: Secondary | ICD-10-CM | POA: Diagnosis not present

## 2022-11-13 DIAGNOSIS — Z1501 Genetic susceptibility to malignant neoplasm of breast: Secondary | ICD-10-CM | POA: Insufficient documentation

## 2022-11-13 DIAGNOSIS — Z9189 Other specified personal risk factors, not elsewhere classified: Secondary | ICD-10-CM | POA: Diagnosis not present

## 2022-11-13 DIAGNOSIS — Z79899 Other long term (current) drug therapy: Secondary | ICD-10-CM | POA: Diagnosis not present

## 2022-11-13 DIAGNOSIS — Z8701 Personal history of pneumonia (recurrent): Secondary | ICD-10-CM | POA: Diagnosis not present

## 2022-11-13 DIAGNOSIS — E785 Hyperlipidemia, unspecified: Secondary | ICD-10-CM | POA: Insufficient documentation

## 2022-11-13 DIAGNOSIS — N62 Hypertrophy of breast: Secondary | ICD-10-CM | POA: Insufficient documentation

## 2022-11-13 DIAGNOSIS — M129 Arthropathy, unspecified: Secondary | ICD-10-CM | POA: Insufficient documentation

## 2022-11-13 DIAGNOSIS — Z7951 Long term (current) use of inhaled steroids: Secondary | ICD-10-CM | POA: Diagnosis not present

## 2022-11-13 NOTE — Progress Notes (Signed)
[facility] CONSULT NOTE  Patient Care Team: Buckner Malta, MD as PCP - General (Family Medicine) Jodelle Red, MD as PCP - Cardiology (Cardiology)  CHIEF COMPLAINTS/PURPOSE OF CONSULTATION:  High risk for breast cancer.  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  She is currently on tamoxifen for prevention.      Breast Cancer Risk Patient with a history of radial scar and a lifetime risk of breast cancer of 30%. Currently on Tamoxifen with tolerable side effects. Recent trauma to the breast from a dog scratch, but no palpable abnormalities on examination. -Continue Tamoxifen. -Perform mammogram today as scheduled. -Order MRI for June 01, 2023. -Return in 6 months for follow-up and breast examination.  General Health Maintenance Discussed the importance of regular exercise and maintaining an active lifestyle. -Encouraged to incorporate regular physical activity into daily routine.    Thank you for consulting on the care of this patient.  Please not hesitate to contact us with any additional questions or concerns  HISTORY OF PRESENTING ILLNESS:   Allison Soto 62 y.o. female is here because of high risk of breast cancer.  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  She is here for follow up on Tamoxifen.  Discussed the use of AI scribe software for clinical note transcription with the patient, who gave verbal consent to proceed.  History of Present Illness    The patient, with a history of breast cancer, presents for a routine follow-up. She reports a recent trauma to the breast when she lifted their 60-pound dog, which resulted in a scratch and significant pain. She is concerned about potential tissue damage and the impact on their scheduled mammogram. She has not noticed any other changes in the breast.  The patient is currently taking tamoxifen for breast cancer, which she tolerates well, with occasional hot flashes. She has not noticed any swelling in their legs or unexpected vaginal bleeding, potential side effects of tamoxifen. She is due for a Pap smear next month.  The patient also mentions a lack of regular exercise due to increased work demands. She expresses a desire to incorporate more activity into their daily routine.       MEDICAL HISTORY:  Past Medical History:  Diagnosis Date   Allergy    Anemia    Anxiety    Arthritis    Asthma    Depression    Family history of breast cancer    GERD (gastroesophageal reflux disease)    Hyperlipidemia    Pneumonia 1987   Pre-diabetes    Radial scar of breast    Reflux    Single cyst of left breast 02/01/12   Diagnostic mammogram and ultrasound - cyst at 2:30    SURGICAL HISTORY: Past Surgical History:  Procedure Laterality Date   BREAST LUMPECTOMY WITH RADIOACTIVE SEED LOCALIZATION Left 12/21/2019   Procedure:  LEFT BREAST LUMPECTOMY WITH RADIOACTIVE SEED LOCALIZATION;  Surgeon: Harriette Bouillon, MD;  Location: [facility];  Service: General;  Laterality: Left;   CESAREAN SECTION  1993, 1995   COLONOSCOPY     INTRAUTERINE DEVICE (IUD) INSERTION  08/19/2015   Mirena   POLYPECTOMY     UPPER GASTROINTESTINAL ENDOSCOPY     WISDOM TOOTH EXTRACTION       SOCIAL HISTORY: Social History   Socioeconomic History   Marital status: Married    Spouse name: Not on file   Number of children: Not on file   Years of education: Not on file   Highest education level: Not on file  Occupational History   Not on file  Tobacco Use   Smoking status: Never   Smokeless tobacco: Never  Vaping Use   Vaping status: Never Used  Substance and Sexual Activity   Alcohol use: Yes    Alcohol/week: 0.0 - 1.0 standard drinks of alcohol    Comment: Occasionally   Drug use: No   Sexual activity: Yes    Partners: Male    Comment: Mirena Inserted 08/19/15  Other Topics Concern   Not on file  Social History Narrative   Right Handed    1 Soda per Day    Social Determinants of Health   Financial Resource Strain: Not on file  Food Insecurity: Not on file  Transportation Needs: Not on file  Physical Activity: Not on file  Stress: Not on file  Social Connections: Not on file  Intimate Partner Violence: Not on file    FAMILY HISTORY: Family History  Problem Relation Age of Onset   Breast cancer Mother 26   Depression Mother    Anxiety disorder Mother    Osteoporosis Mother    Thyroid disease Mother        on synthroid   Depression Sister    Anxiety disorder Sister    Stroke Father        died 2012-02-24   Dementia Father    Alzheimer's disease Paternal Aunt    Breast cancer Cousin        triple negative   Breast cancer Cousin        mom's cousin   Colon cancer Neg Hx    Colon polyps Neg Hx    Esophageal cancer Neg Hx    Rectal cancer Neg Hx    Stomach cancer Neg Hx     ALLERGIES:  is allergic to shellfish allergy and penicillins.  MEDICATIONS:  Current Outpatient Medications  Medication Sig Dispense Refill   albuterol (PROAIR HFA) 108 (90 Base) MCG/ACT inhaler Inhale 2 puffs into the lungs every 4 (four) hours as needed for wheezing or shortness of breath. 1 Inhaler 3   Albuterol-Budesonide (AIRSUPRA) 90-80 MCG/ACT AERO Inhale 2  puffs into the lungs as needed (every four to six hours for cough, wheeze, shortness of breath.  Rinse, gargle, and spit after use). 10.7 g 1   budesonide-formoterol (SYMBICORT) 160-4.5 MCG/ACT inhaler Inhale 2 puffs into the lungs 2 (two) times daily.     Cholecalciferol (VITAMIN D-3) 1000 units CAPS Take 1 capsule by mouth daily. (Patient not taking: Reported on 10/28/2022)     Coenzyme Q10 (CO Q 10 PO) Take by mouth.     CRESTOR 10 MG tablet      dexlansoprazole (DEXILANT) 60 MG capsule Take 60 mg by mouth daily.     EPINEPHrine (EPIPEN 2-PAK) 0.3 mg/0.3 mL IJ SOAJ injection Use as directed for  life-threatening allergic reaction. 2 each 2   famotidine (PEPCID) 40 MG tablet TAKE 1 TABLET BY MOUTH EVERY DAY IN THE EVENING 30 tablet 5   fluvoxaMINE (LUVOX) 100 MG tablet Take 100 mg by mouth at bedtime.     ipratropium-albuterol (DUONEB) 0.5-2.5 (3) MG/3ML SOLN SMARTSIG:3 Milliliter(s) Via Nebulizer Every 6 Hours PRN     ketoconazole (NIZORAL) 2 % shampoo Apply topically 3 (three) times a week.     MAGNESIUM PO Take by mouth daily. (Patient not taking: Reported on 10/28/2022)     minoxidil (LONITEN) 2.5 MG tablet Take by mouth.     Multiple Vitamin (MULTIVITAMIN WITH MINERALS) TABS tablet Take 1 tablet by mouth daily.     RYALTRIS [id number] MCG/ACT SUSP Place 2 sprays into both nostrils 2 (two) times daily.     spironolactone (ALDACTONE) 50 MG tablet Take 50 mg by mouth 2 (two) times daily.     tamoxifen (NOLVADEX) 20 MG tablet Take 1 tablet (20 mg total) by mouth daily. 30 tablet 11   TEZSPIRE 210 MG/1. SOAJ Inject into the skin.     traZODone (DESYREL) 25 mg TABS tablet Take 25 mg by mouth at bedtime.     No current facility-administered medications for this visit.   PHYSICAL EXAMINATION:  ECOG PERFORMANCE STATUS: 0 - Asymptomatic  Vital signs and physical examination not done, telehealth visit  BP 121/62 (BP Location: Right Arm, Patient Position: Sitting)   Pulse 81   Temp 97.7 F  (36.5 C) (Oral)   Resp 17   Ht 5\' 5"  (1.651 m)   Wt 142 lb 6.4 oz (64.6 kg)   LMP 01/23/2017   SpO2 100%   BMI 23.70 kg/m   Physical Exam Constitutional:      Appearance: Normal appearance.  Cardiovascular:     Rate and Rhythm: Normal rate and regular rhythm.  Pulmonary:     Effort: Pulmonary effort is normal.     Breath sounds: Normal breath sounds.  Chest:     Comments: Bilateral breast examined.  No palpable masses or regional adenopathy Abdominal:     General: Abdomen is flat. Bowel sounds are normal.     Palpations: Abdomen is soft.  Musculoskeletal:     Cervical back: Normal range of motion and neck supple. No rigidity.  Lymphadenopathy:     Cervical: No cervical adenopathy.  Skin:    General: Skin is warm and dry.  Neurological:     General: No focal deficit present.     Mental Status: She is alert.     LABORATORY DATA:  I have reviewed the data as listed Lab Results  Component Value Date   WBC 9.1 03/19/2022   HGB 14.9 03/19/2022   HCT 44.8 03/19/2022   MCV 92 03/19/2022   PLT 248 03/19/2022     Chemistry      Component Value Date/Time   NA 140 12/18/2019 0930   K 5.0 12/18/2019 0930   CL 103 12/18/2019 0930   CO2 29 12/18/2019 0930   BUN 12 12/18/2019 0930   CREATININE 0.78 12/18/2019 0930      Component Value Date/Time   CALCIUM 9.4 12/18/2019 0930   ALKPHOS 44 12/18/2019 0930   AST 29 12/18/2019 0930   ALT 32 12/18/2019 0930   BILITOT 0.3 12/18/2019 0930       RADIOGRAPHIC STUDIES: I have personally reviewed the radiological images as listed and agreed with the findings in the report. No results found.  I spent 20 minutes in  the care of this patient including history, review of records counseling, surveillance with MRIs and mammograms and documentation.   Rachel Moulds, MD 11/13/2022 9:26 AM

## 2022-11-25 ENCOUNTER — Encounter: Payer: Self-pay | Admitting: Allergy and Immunology

## 2022-11-25 ENCOUNTER — Ambulatory Visit (INDEPENDENT_AMBULATORY_CARE_PROVIDER_SITE_OTHER): Payer: No Typology Code available for payment source | Admitting: Allergy and Immunology

## 2022-11-25 VITALS — BP 118/78 | HR 76 | Resp 12 | Ht 63.7 in | Wt 144.0 lb

## 2022-11-25 DIAGNOSIS — J4540 Moderate persistent asthma, uncomplicated: Secondary | ICD-10-CM | POA: Diagnosis not present

## 2022-11-25 DIAGNOSIS — K219 Gastro-esophageal reflux disease without esophagitis: Secondary | ICD-10-CM

## 2022-11-25 DIAGNOSIS — J3089 Other allergic rhinitis: Secondary | ICD-10-CM

## 2022-11-25 NOTE — Progress Notes (Unsigned)
[place] - [place] - [place] - [place] - [place]   Follow-up Note  Referring Provider: Buckner Malta, MD Primary Provider: Buckner Malta, MD Date of Office Visit: 11/25/2022  Subjective:   Allison Soto (DOB: 07/01/1960) is a 62 y.o. female who [facility] in re-evaluation of the following:  HPI: [name] returns to this clinic in evaluation of asthma, allergic rhinitis, LPR, shellfish allergy.  I last saw her in this clinic 29 Jun 2022.  Her asthma has been under excellent control using tezepelumab injections every 4 weeks.  She still continues to use Symbicort twice a day and she has not required a systemic steroid or an antibiotic and rare use of a short acting bronchodilator and she can exert herself without any problem.  Her nose has been doing very well while intermittently using a combination nasal steroid/nasal antihistamine.  Her LPR has been under excellent control while using a combination of her proton pump inhibitor and H2 receptor blocker.  She feels as though she needs both of these agents for if she misses 1 of these for just 1 day she develops problems with reflux.  She has obtained this years COVID-vaccine but not the flu vaccine.  She remains away from shellfish consumption.  Allergies as of 11/25/2022       Reactions   Shellfish Allergy Anaphylaxis   Throat closes.   Penicillins Other (See Comments)   Mouth sores.        Medication List    Airsupra 90-80 MCG/ACT Aero Generic drug: Albuterol-Budesonide Inhale 2 puffs into the lungs as needed (every four to six hours for cough, wheeze, shortness of breath.  Rinse, gargle, and spit after use).   budesonide-formoterol 160-4.5 MCG/ACT inhaler Commonly known as: SYMBICORT Inhale 2 puffs into the lungs 2 (two) times daily.   CO Q 10 PO Take by mouth.   Crestor 10 MG tablet Generic drug: rosuvastatin   dexlansoprazole 60 MG  capsule Commonly known as: DEXILANT Take 60 mg by mouth daily.   EPINEPHrine 0.3 mg/0.3 mL Soaj injection Commonly known as: EpiPen 2-Pak Use as directed for life-threatening allergic reaction.   famotidine 40 MG tablet Commonly known as: PEPCID TAKE 1 TABLET BY MOUTH EVERY DAY IN THE EVENING   fluvoxaMINE 100 MG tablet Commonly known as: LUVOX Take 100 mg by mouth at bedtime.   ipratropium-albuterol 0.5-2.5 (3) MG/3ML Soln Commonly known as: DUONEB SMARTSIG:3 Milliliter(s) Via Nebulizer Every 6 Hours PRN   ketoconazole 2 % shampoo Commonly known as: NIZORAL Apply topically 3 (three) times a week.   minoxidil 2.5 MG tablet Commonly known as: LONITEN Take by mouth.   multivitamin with minerals Tabs tablet Take 1 tablet by mouth daily.   Ryaltris 010-27 MCG/ACT Susp Generic drug: Olopatadine-Mometasone Place 2 sprays into both nostrils 2 (two) times daily.   spironolactone 50 MG tablet Commonly known as: ALDACTONE Take 50 mg by mouth 2 (two) times daily.   tamoxifen 20 MG tablet Commonly known as: NOLVADEX Take 1 tablet (20 mg total) by mouth daily.   Tezspire 210 MG/1. Soaj Generic drug: Tezepelumab-ekko Inject into the skin.   traZODone 25 mg Tabs tablet Commonly known as: DESYREL Take 25 mg by mouth at bedtime.   Vitamin D-3 25 MCG (1000 UT) Caps Take 1 capsule by mouth daily.    Past Medical History:  Diagnosis Date   Allergy    Anemia    Anxiety    Arthritis  Asthma    Depression    Family history of breast cancer    GERD (gastroesophageal reflux disease)    Hyperlipidemia    Pneumonia 1987   Pre-diabetes    Radial scar of breast    Reflux    Single cyst of left breast 02/01/12   Diagnostic mammogram and ultrasound - cyst at 2:30    Past Surgical History:  Procedure Laterality Date   BREAST LUMPECTOMY WITH RADIOACTIVE SEED LOCALIZATION Left 12/21/2019   Procedure: LEFT BREAST LUMPECTOMY WITH RADIOACTIVE SEED LOCALIZATION;  Surgeon:  Harriette Bouillon, MD;  Location: [facility];  Service: General;  Laterality: Left;   CESAREAN SECTION  1993, 1995   COLONOSCOPY     INTRAUTERINE DEVICE (IUD) INSERTION  08/19/2015   Mirena   POLYPECTOMY     UPPER GASTROINTESTINAL ENDOSCOPY     WISDOM TOOTH EXTRACTION      Review of systems negative except as noted in HPI / PMHx or noted below:  Review of Systems  Constitutional: Negative.   HENT: Negative.    Eyes: Negative.   Respiratory: Negative.    Cardiovascular: Negative.   Gastrointestinal: Negative.   Genitourinary: Negative.   Musculoskeletal: Negative.   Skin: Negative.   Neurological: Negative.   Endo/Heme/Allergies: Negative.   Psychiatric/Behavioral: Negative.       Objective:   Vitals:   11/25/22 1048  BP: 118/78  Pulse: 76  Resp: 12  SpO2: 97%   Height: 5' 3.7" (161.8 cm)  Weight: 144 lb (65.3 kg)   Physical Exam Constitutional:      Appearance: She is not diaphoretic.  HENT:     Head: Normocephalic.     Right Ear: Tympanic membrane, ear canal and external ear normal.     Left Ear: Tympanic membrane, ear canal and external ear normal.     Nose: Nose normal. No mucosal edema or rhinorrhea.     Mouth/Throat:     Pharynx: Uvula midline. No oropharyngeal exudate.  Eyes:     Conjunctiva/sclera: Conjunctivae normal.  Neck:     Thyroid: No thyromegaly.     Trachea: Trachea normal. No tracheal tenderness or tracheal deviation.  Cardiovascular:     Rate and Rhythm: Normal rate and regular rhythm.     Heart sounds: Normal heart sounds, S1 normal and S2 normal. No murmur heard. Pulmonary:     Effort: No respiratory distress.     Breath sounds: Normal breath sounds. No stridor. No wheezing or rales.  Lymphadenopathy:     Head:     Right side of head: No tonsillar adenopathy.     Left side of head: No tonsillar adenopathy.     Cervical: No cervical adenopathy.  Skin:    Findings: No erythema or rash.     Nails: There is no clubbing.   Neurological:     Mental Status: She is alert.     Diagnostics: Spirometry was performed and demonstrated an FEV1 of 1.99 at 84 % of predicted.  Assessment and Plan:   1. Asthma, moderate persistent, well-controlled   2. Perennial allergic rhinitis   3. LPRD (laryngopharyngeal reflux disease)    1.  Treat and prevent inflammation:  A. Symbicort 160 - 2 inhalations 1-2 times per day (empty lungs) B. Ryaltris - 2 spray each nostril 1-2 times per day C. Tezepelumab injections every 28 days  2.  Treat and prevent reflux/LPR:  A. Dexilant 60 mg in PM B. Famotidine 40 mg - 1 tablet in AM C. Replace  throat clearing with swallowing/drinking maneuver  3.  If needed:  A. AirSupra - 2 inhalations every 4-6 hours B. Mucinex DM - 2 times per day C. Carbinoxamine 4 mg - 1-2 tablets 1-2 times per day D. Epi-pen  4. Return to clinic in 6 months or earlier if problem  5. Obtain fall flu vaccine  Wylodean is doing very well while utilizing tezepelumab to control her atopic disease and she has a selection of other anti-inflammatory agents for her airway as noted above and has a plan for treating her LPR as noted above and assuming she does well with this therapeutic plan I will see her back in this clinic in 6 months or earlier if there is a problem.  Laurette Schimke, MD Allergy / Immunology [facility]

## 2022-11-25 NOTE — Patient Instructions (Signed)
  1.  Treat and prevent inflammation:  A. Symbicort 160 - 2 inhalations 1-2 times per day (empty lungs) B. Ryaltris - 2 spray each nostril 1-2 times per day C. Tezepelumab injections every 28 days  2.  Treat and prevent reflux/LPR:  A. Dexilant 60 mg in PM B. Famotidine 40 mg - 1 tablet in AM C. Replace throat clearing with swallowing/drinking maneuver  3.  If needed:  A. AirSupra - 2 inhalations every 4-6 hours B. Mucinex DM - 2 times per day C. Carbinoxamine 4 mg - 1-2 tablets 1-2 times per day D. Epi-pen  4. Return to clinic in 6 months or earlier if problem  5. Obtain fall flu vaccine

## 2022-11-26 ENCOUNTER — Encounter: Payer: Self-pay | Admitting: Allergy and Immunology

## 2022-12-02 ENCOUNTER — Encounter: Payer: Self-pay | Admitting: Diagnostic Neuroimaging

## 2022-12-07 ENCOUNTER — Encounter: Payer: Self-pay | Admitting: Diagnostic Neuroimaging

## 2022-12-10 ENCOUNTER — Emergency Department (HOSPITAL_COMMUNITY)
Admission: EM | Admit: 2022-12-10 | Discharge: 2022-12-11 | Disposition: A | Discharge status: Home / Self Care | Payer: No Typology Code available for payment source | Attending: Emergency Medicine | Admitting: Emergency Medicine

## 2022-12-10 ENCOUNTER — Emergency Department (HOSPITAL_COMMUNITY): Payer: No Typology Code available for payment source

## 2022-12-10 DIAGNOSIS — S0990XA Unspecified injury of head, initial encounter: Secondary | ICD-10-CM

## 2022-12-10 DIAGNOSIS — S0181XA Laceration without foreign body of other part of head, initial encounter: Secondary | ICD-10-CM | POA: Diagnosis present

## 2022-12-10 DIAGNOSIS — Z23 Encounter for immunization: Secondary | ICD-10-CM | POA: Insufficient documentation

## 2022-12-10 DIAGNOSIS — S50312A Abrasion of left elbow, initial encounter: Secondary | ICD-10-CM | POA: Diagnosis not present

## 2022-12-10 DIAGNOSIS — R200 Anesthesia of skin: Secondary | ICD-10-CM | POA: Insufficient documentation

## 2022-12-10 DIAGNOSIS — W0110XA Fall on same level from slipping, tripping and stumbling with subsequent striking against unspecified object, initial encounter: Secondary | ICD-10-CM | POA: Diagnosis not present

## 2022-12-10 DIAGNOSIS — S42444A Nondisplaced fracture (avulsion) of medial epicondyle of right humerus, initial encounter for closed fracture: Secondary | ICD-10-CM | POA: Diagnosis not present

## 2022-12-10 DIAGNOSIS — S01111A Laceration without foreign body of right eyelid and periocular area, initial encounter: Secondary | ICD-10-CM | POA: Diagnosis not present

## 2022-12-10 DIAGNOSIS — W19XXXA Unspecified fall, initial encounter: Secondary | ICD-10-CM

## 2022-12-10 DIAGNOSIS — J45909 Unspecified asthma, uncomplicated: Secondary | ICD-10-CM | POA: Diagnosis not present

## 2022-12-10 MED ORDER — ACETAMINOPHEN 325 MG PO TABS
650.0000 mg | ORAL_TABLET | Freq: Once | ORAL | Status: AC
  Administered 2022-12-10: 650 mg via ORAL
  Filled 2022-12-10: qty 2

## 2022-12-10 NOTE — ED Triage Notes (Signed)
Pt coming in due to falling while pulling a wagon of candy a trunk or treat. States she fell and landed on her right elbow and face/ She has a skin abrasion on her right elbow that has stopped bleeding and a 2.5in laceration on her forehead that has stopped bleeding. She is no on blood thinners. She is other wise stable at this time.

## 2022-12-10 NOTE — ED Provider Triage Note (Signed)
Emergency Medicine Provider Triage Evaluation Note  Allison Soto , a 62 y.o. female  was evaluated in triage.  Pt complains of forehead pain following a fall.  She reports that she was pulling a wagon when her heel caught the edge of the wagon and she fell face first onto a concrete.  She believes that she did not have syncopal episode but is not sure.  She denies complaints prior to fall.  She does not currently have visual disturbances, paresthesia.  Review of Systems  Positive: Laceration to forehead Negative: Fever, visual disturbances, paresthesia  Physical Exam  BP 118/80 (BP Location: Left Arm)   Pulse 87   Temp 98.2 F (36.8 C)   Resp 17   LMP 01/23/2017   SpO2 99%  Gen:   Awake, no distress   Resp:  Normal effort  MSK:   Moves extremities without difficulty  Other:    Medical Decision Making  Medically screening exam initiated at 8:20 PM.  Appropriate orders placed.  Allison Soto was informed that the remainder of the evaluation will be completed by another provider, this initial triage assessment does not replace that evaluation, and the importance of remaining in the ED until their evaluation is complete.  Ct and tylenol ordered   Judithann Sheen, [place] 12/10/22 2021

## 2022-12-11 ENCOUNTER — Emergency Department (HOSPITAL_COMMUNITY): Payer: No Typology Code available for payment source

## 2022-12-11 ENCOUNTER — Ambulatory Visit (HOSPITAL_BASED_OUTPATIENT_CLINIC_OR_DEPARTMENT_OTHER): Payer: No Typology Code available for payment source | Admitting: Obstetrics & Gynecology

## 2022-12-11 MED ORDER — TETANUS-DIPHTH-ACELL PERTUSSIS 5-2.5-18.5 LF-MCG/0.5 IM SUSY
0.5000 mL | PREFILLED_SYRINGE | Freq: Once | INTRAMUSCULAR | Status: AC
  Administered 2022-12-11: 0.5 mL via INTRAMUSCULAR
  Filled 2022-12-11: qty 0.5

## 2022-12-11 MED ORDER — LIDOCAINE HCL (PF) 1 % IJ SOLN: 10.0000 mL | Freq: Once | INTRAMUSCULAR | Status: DC

## 2022-12-11 MED ORDER — LIDOCAINE-EPINEPHRINE 1 %-1:100000 IJ SOLN
10.0000 mL | Freq: Once | INTRAMUSCULAR | Status: AC
  Administered 2022-12-11: 10 mL
  Filled 2022-12-11: qty 1

## 2022-12-11 MED ORDER — LORAZEPAM 1 MG PO TABS
1.0000 mg | ORAL_TABLET | Freq: Once | ORAL | Status: AC
  Administered 2022-12-11: 1 mg via ORAL
  Filled 2022-12-11: qty 1

## 2022-12-11 MED ORDER — CEPHALEXIN 500 MG PO CAPS: 500.0000 mg | ORAL_CAPSULE | Freq: Four times a day (QID) | ORAL | 0 refills | Status: DC

## 2022-12-11 NOTE — Discharge Instructions (Addendum)
Please keep the areas warm and dry. If they become red, swollen, or hot to the touch return to the ED or follow-up with your PCP. Wear the sling at all times and follow-up with orthopedics. If you become confused, have intractable nausea or vomiting, return to the ER.

## 2022-12-11 NOTE — ED Provider Notes (Signed)
[place] [facility] AT [facility] Provider Note   CSN: [id number] Arrival date & time: 12/10/22  2000     History  Chief Complaint  Patient presents with   Allison Soto is a 62 y.o. female, history of asthma, who presents to the ED secondary to fall when she was at her trunk or treat.  She states she thinks she may have tripped on something, and fell forward and hit her face.  Did not lose consciousness, but does not remember exactly what happened.  Denies any dizziness, shortness of breath, or chest pain prior to the fall however.  She notes that she has some pain in her right elbow, and a laceration to her face.  States she also fell on her knee, but it does not hurt.  Unknown last tetanus.  States that she feels better after the Tylenol.  No use of thinners.   Home Medications Prior to Admission medications   Medication Sig Start Date End Date Taking? Authorizing Provider  cephALEXin (KEFLEX) 500 MG capsule Take 1 capsule (500 mg total) by mouth 4 (four) times daily. 12/11/22  Yes Keishawna Carranza L, PA  Albuterol-Budesonide (AIRSUPRA) 90-80 MCG/ACT AERO Inhale 2 puffs into the lungs as needed (every four to six hours for cough, wheeze, shortness of breath.  Rinse, gargle, and spit after use). 03/19/22   Kozlow, Alvira Philips, MD  budesonide-formoterol (SYMBICORT) 160-4.5 MCG/ACT inhaler Inhale 2 puffs into the lungs 2 (two) times daily.    [provider]  Cholecalciferol (VITAMIN D-3) 1000 units CAPS Take 1 capsule by mouth daily.    [provider]  Coenzyme Q10 (CO Q 10 PO) Take by mouth.    [provider]  CRESTOR 10 MG tablet     [provider]  dexlansoprazole (DEXILANT) 60 MG capsule Take 60 mg by mouth daily.    [provider]  EPINEPHrine (EPIPEN 2-PAK) 0.3 mg/0.3 mL IJ SOAJ injection Use as directed for life-threatening allergic reaction. 07/23/21   Kozlow, Alvira Philips, MD  famotidine (PEPCID) 40 MG  tablet TAKE 1 TABLET BY MOUTH EVERY DAY IN THE EVENING 10/06/21   Kozlow, Alvira Philips, MD  fluvoxaMINE (LUVOX) 100 MG tablet Take 100 mg by mouth at bedtime.    [provider]  ipratropium-albuterol (DUONEB) 0.5-2.5 (3) MG/3ML SOLN SMARTSIG:3 Milliliter(s) Via Nebulizer Every 6 Hours PRN 02/10/22   [provider]  ketoconazole (NIZORAL) 2 % shampoo Apply topically 3 (three) times a week. 06/17/21   [provider]  minoxidil (LONITEN) 2.5 MG tablet Take by mouth. 05/04/21   [provider]  Multiple Vitamin (MULTIVITAMIN WITH MINERALS) TABS tablet Take 1 tablet by mouth daily.    [provider]  RYALTRIS [phone] MCG/ACT SUSP Place 2 sprays into both nostrils 2 (two) times daily.    [provider]  spironolactone (ALDACTONE) 50 MG tablet Take 50 mg by mouth 2 (two) times daily. 04/11/21   [provider]  tamoxifen (NOLVADEX) 20 MG tablet Take 1 tablet (20 mg total) by mouth daily. 06/30/22   Rachel Moulds, MD  TEZSPIRE 210 MG/1. SOAJ Inject into the skin. 06/09/22   [provider]  traZODone (DESYREL) 25 mg TABS tablet Take 25 mg by mouth at bedtime.    [provider]      Allergies    Shellfish allergy and Penicillins    Review of Systems   Review of Systems  Skin:  Positive for wound.  Neurological:  Negative for dizziness and headaches.    Physical Exam Updated Vital Signs BP 110/67   Pulse 78   Temp 97.9 F (36.6 C)   Resp 17   LMP 01/23/2017   SpO2 96%  Physical Exam Vitals and nursing note reviewed.  Constitutional:      General: She is not in acute distress.    Appearance: She is well-developed.  HENT:     Head: Normocephalic and atraumatic.  Eyes:     Conjunctiva/sclera: Conjunctivae normal.  Cardiovascular:     Rate and Rhythm: Normal rate and regular rhythm.     Heart sounds: No murmur heard. Pulmonary:     Effort: Pulmonary effort is normal. No respiratory distress.     Breath sounds:  Normal breath sounds.  Abdominal:     Palpations: Abdomen is soft.     Tenderness: There is no abdominal tenderness.  Musculoskeletal:        General: No swelling.     Cervical back: Neck supple.     Comments: Right Knee: No ttp. An effusion is not present.  Negative anterior and posterior drawer. Negative Mcmurray's. +Patellar stability. Negative valgus and varus stress test.. Extension and flexion intact. No sensory deficits.   Tenderness to palpation of lateral epicondyle, of right elbow.  Able to supinate, pronate, flex and extend arm as appropriately.  Skin:    General: Skin is warm and dry.     Capillary Refill: Capillary refill takes less than 2 seconds.     Comments: +2cm laceration to R brow, abrasion to L elbow  Neurological:     Mental Status: She is alert.     Comments: Numbness of the forehead and hairline along the ophthalmic nerve.  No facial droop, or palsy noted.  Psychiatric:        Mood and Affect: Mood normal.     ED Results / Procedures / Treatments   Labs (all labs ordered are listed, but only abnormal results are displayed) Labs Reviewed - No data to display  EKG None  Radiology DG Elbow Complete Right  Result Date: 12/11/2022 CLINICAL DATA:  Encounter for pain and abrasion from pole EXAM: RIGHT ELBOW - COMPLETE 3+ VIEW COMPARISON:  None Available. FINDINGS: No dislocation or joint effusion. Tiny osseous fragment adjacent to the medial epicondyle is favored chronic. Soft tissues are unremarkable. IMPRESSION: Tiny osseous fragment adjacent to medial epicondyle is favored chronic. Correlate with site of pain to exclude acute avulsion fracture. Electronically Signed   By: Minerva Fester M.D.   On: 12/11/2022 03:29   CT Head Wo Contrast  Result Date: 12/10/2022 CLINICAL DATA:  Head trauma, moderate-severe; Neck trauma, dangerous injury mechanism (Age 61-64y) EXAM: CT HEAD WITHOUT CONTRAST CT CERVICAL SPINE WITHOUT CONTRAST TECHNIQUE: Multidetector CT imaging  of the head and cervical spine was performed following the standard protocol without intravenous contrast. Multiplanar CT image reconstructions of the cervical spine were also generated. RADIATION DOSE REDUCTION: This exam was performed according to the departmental dose-optimization program which includes automated exposure control, adjustment of the mA and/or kV according to patient size and/or use of iterative reconstruction technique. COMPARISON:  None Available. FINDINGS: CT HEAD FINDINGS Brain: No hemorrhage. No hydrocephalus. No extra-axial fluid collection. No CT evidence of an acute cortical infarct. No mass effect. No mass lesion. Vascular: No hyperdense vessel or unexpected calcification. Skull: Soft tissue laceration in the supraorbital soft tissues on the right. No evidence of an underlying calvarial fracture. Sinuses/Orbits: No middle ear or mastoid  effusion. Paranasal are clear. Orbits are unremarkable. Other: None. CT CERVICAL SPINE FINDINGS Alignment: Normal. Skull base and vertebrae: No acute fracture. No primary bone lesion or focal pathologic process. Soft tissues and spinal canal: No prevertebral fluid or swelling. No visible canal hematoma. Disc levels:  No evidence of high-grade spinal canal stenosis Upper chest: Negative. Other: None IMPRESSION: 1. No CT evidence of intracranial injury. 2. Laceration in the supraorbital soft tissues on the right. No evidence of an underlying calvarial fracture. 3. No acute fracture or traumatic subluxation of the cervical spine. Electronically Signed   By: Lorenza Cambridge M.D.   On: 12/10/2022 21:51   CT Cervical Spine Wo Contrast  Result Date: 12/10/2022 CLINICAL DATA:  Head trauma, moderate-severe; Neck trauma, dangerous injury mechanism (Age 42-64y) EXAM: CT HEAD WITHOUT CONTRAST CT CERVICAL SPINE WITHOUT CONTRAST TECHNIQUE: Multidetector CT imaging of the head and cervical spine was performed following the standard protocol without intravenous  contrast. Multiplanar CT image reconstructions of the cervical spine were also generated. RADIATION DOSE REDUCTION: This exam was performed according to the departmental dose-optimization program which includes automated exposure control, adjustment of the mA and/or kV according to patient size and/or use of iterative reconstruction technique. COMPARISON:  None Available. FINDINGS: CT HEAD FINDINGS Brain: No hemorrhage. No hydrocephalus. No extra-axial fluid collection. No CT evidence of an acute cortical infarct. No mass effect. No mass lesion. Vascular: No hyperdense vessel or unexpected calcification. Skull: Soft tissue laceration in the supraorbital soft tissues on the right. No evidence of an underlying calvarial fracture. Sinuses/Orbits: No middle ear or mastoid effusion. Paranasal are clear. Orbits are unremarkable. Other: None. CT CERVICAL SPINE FINDINGS Alignment: Normal. Skull base and vertebrae: No acute fracture. No primary bone lesion or focal pathologic process. Soft tissues and spinal canal: No prevertebral fluid or swelling. No visible canal hematoma. Disc levels:  No evidence of high-grade spinal canal stenosis Upper chest: Negative. Other: None IMPRESSION: 1. No CT evidence of intracranial injury. 2. Laceration in the supraorbital soft tissues on the right. No evidence of an underlying calvarial fracture. 3. No acute fracture or traumatic subluxation of the cervical spine. Electronically Signed   By: Lorenza Cambridge M.D.   On: 12/10/2022 21:51    Procedures .Marland KitchenLaceration Repair  Date/Time: 12/11/2022 3:12 AM  Performed by: Pete Pelt, PA Authorized by: Pete Pelt, PA   Consent:    Consent obtained:  Verbal   Consent given by:  Patient   Risks, benefits, and alternatives were discussed: yes     Risks discussed:  Infection, nerve damage, need for additional repair, poor cosmetic result, poor wound healing, pain, retained foreign body, tendon damage and vascular damage    Alternatives discussed:  No treatment Universal protocol:    Patient identity confirmed:  Verbally with patient Anesthesia:    Anesthesia method:  Local infiltration   Local anesthetic:  Lidocaine 1% WITH epi Laceration details:    Location:  Face   Face location:  R eyebrow   Length (cm):  2.5 Pre-procedure details:    Preparation:  Patient was prepped and draped in usual sterile fashion Treatment:    Area cleansed with:  Chlorhexidine   Amount of cleaning:  Standard   Irrigation solution:  Sterile saline   Irrigation method:  Pressure wash   Debridement:  None Skin repair:    Repair method:  Sutures   Suture size:  5-0   Suture material:  Fast-absorbing gut   Suture technique:  Simple interrupted  Number of sutures:  8 Repair type:    Repair type:  Simple Post-procedure details:    Dressing:  Open (no dressing)   Procedure completion:  Tolerated .Marland KitchenLaceration Repair  Date/Time: 12/11/2022 3:12 AM  Performed by: Pete Pelt, PA Authorized by: Pete Pelt, PA   Consent:    Consent obtained:  Verbal   Consent given by:  Patient   Risks discussed:  Infection, need for additional repair, nerve damage, poor wound healing, pain, poor cosmetic result and retained foreign body Universal protocol:    Patient identity confirmed:  Verbally with patient Anesthesia:    Anesthesia method:  None Laceration details:    Location:  Shoulder/arm   Shoulder/arm location:  R elbow   Length (cm):  2 Treatment:    Area cleansed with:  Chlorhexidine   Amount of cleaning:  Standard   Irrigation solution:  Sterile saline Skin repair:    Repair method:  Steri-Strips   Number of Steri-Strips:  3 Approximation:    Approximation:  Close     Medications Ordered in ED Medications  acetaminophen (TYLENOL) tablet 650 mg (650 mg Oral Given 12/10/22 2031)  Tdap (BOOSTRIX) injection 0.5 mL (0.5 mLs Intramuscular Given 12/11/22 0220)  LORazepam (ATIVAN) tablet 1 mg (1 mg Oral Given  12/11/22 0219)  lidocaine-EPINEPHrine (XYLOCAINE W/EPI) 1 %-1:100000 (with pres) injection 10 mL (10 mLs Other Given 12/11/22 0340)    ED Course/ Medical Decision Making/ A&P                                 Medical Decision Making Patient is a 62 year old female, here for fall that occurred, or trauma, out of trunk and treat.  She has a lack to her face, unknown last tetanus.  Did not lose consciousness, is not on any blood thinners.  Also has pain to the right elbow.  We will x-ray her elbow, and repaired face, will obtain a CT head, and neck, as she does not remember what happened.  But she denies any chest pain, shortness of breath, or confusion, nausea or vomiting.  Amount and/or Complexity of Data Reviewed Radiology: ordered.    Details: Head/neck unremarkable, except for a laceration to the supraorbital soft tissues.  No underlying fracture.  Avulsion fracture of elbow Discussion of management or test interpretation with external provider(s): Discussed with patient, sutured with 8 absorbable sutures, on the right eyebrow.  Good hemostasis, I irrigated the right elbow, and wound is present, but no bone present.  No evidence of any kind of foreign bodies.  I do believe that she does have an avulsion fracture, of his elbow, secondary to point tenderness.  She was placed in a sling and encouraged to follow-up with orthopedics.  We discussed return precautions and she voiced understanding tetanus updated given wounds.  Risk Prescription drug management.   Final Clinical Impression(s) / ED Diagnoses Final diagnoses:  Fall, initial encounter  Injury of head, initial encounter  Closed nondisplaced avulsion fracture of medial epicondyle of right humerus, initial encounter    Rx / DC Orders ED Discharge Orders          Ordered    cephALEXin (KEFLEX) 500 MG capsule  4 times daily        12/11/22 0412              Allison Soto, Harley [place], PA 12/11/22 0650    Nira Conn,  MD 12/11/22 [phone]

## 2022-12-14 ENCOUNTER — Ambulatory Visit
Admission: RE | Admit: 2022-12-14 | Discharge: 2022-12-14 | Disposition: A | Discharge status: Home / Self Care | Payer: No Typology Code available for payment source | Source: Ambulatory Visit | Attending: Diagnostic Neuroimaging | Admitting: Diagnostic Neuroimaging

## 2022-12-14 DIAGNOSIS — G249 Dystonia, unspecified: Secondary | ICD-10-CM

## 2022-12-14 DIAGNOSIS — R251 Tremor, unspecified: Secondary | ICD-10-CM

## 2022-12-14 DIAGNOSIS — R200 Anesthesia of skin: Secondary | ICD-10-CM

## 2022-12-14 MED ORDER — GADOPICLENOL 0.5 MMOL/ML IV SOLN
6.0000 mL | Freq: Once | INTRAVENOUS | Status: AC | PRN
  Administered 2022-12-14: 6 mL via INTRAVENOUS

## 2022-12-17 ENCOUNTER — Telehealth: Payer: Self-pay

## 2022-12-17 NOTE — Telephone Encounter (Signed)
-----   Message from [facility]ford [facility] sent at 12/16/2022  7:23 PM EST ----- Unremarkable imaging results. Please call patient. Continue current plan. -VRP

## 2022-12-17 NOTE — Telephone Encounter (Signed)
Called patient to informed her that the MRI cervical spine and MRI of the brian was normal. Patient states she reduced the dose of her Crestor and she started to feel better. Patient thanks [place] and Pt verbalized understanding. Pt had no questions at this time but was encouraged to call back if questions arise.

## 2022-12-29 ENCOUNTER — Other Ambulatory Visit: Payer: Self-pay | Admitting: Hematology and Oncology

## 2022-12-29 MED ORDER — TAMOXIFEN CITRATE 20 MG PO TABS: 20.0000 mg | ORAL_TABLET | Freq: Every day | ORAL | 11 refills | Status: DC

## 2023-02-26 ENCOUNTER — Encounter: Payer: Self-pay | Admitting: Hematology and Oncology

## 2023-03-03 ENCOUNTER — Telehealth: Payer: Self-pay | Admitting: Hematology and Oncology

## 2023-03-03 NOTE — Telephone Encounter (Signed)
Rescheduled appointment per scheduling message. Patient is aware of the changes made and will be mailed an appointment reminder.

## 2023-03-16 ENCOUNTER — Encounter: Payer: Self-pay | Admitting: Allergy and Immunology

## 2023-03-18 ENCOUNTER — Other Ambulatory Visit: Payer: Self-pay | Admitting: *Deleted

## 2023-03-18 MED ORDER — TEZSPIRE 210 MG/1.91ML ~~LOC~~ SOAJ: 210.0000 mg | SUBCUTANEOUS | 11 refills | Status: AC

## 2023-03-18 NOTE — Telephone Encounter (Signed)
Patient advised refill sent. 

## 2023-04-05 ENCOUNTER — Ambulatory Visit (HOSPITAL_BASED_OUTPATIENT_CLINIC_OR_DEPARTMENT_OTHER): Payer: No Typology Code available for payment source | Admitting: Obstetrics & Gynecology

## 2023-04-05 ENCOUNTER — Encounter: Payer: Self-pay | Admitting: Hematology and Oncology

## 2023-04-05 ENCOUNTER — Encounter (HOSPITAL_BASED_OUTPATIENT_CLINIC_OR_DEPARTMENT_OTHER): Payer: Self-pay

## 2023-04-06 ENCOUNTER — Other Ambulatory Visit: Payer: Self-pay | Admitting: *Deleted

## 2023-04-06 DIAGNOSIS — Z9189 Other specified personal risk factors, not elsewhere classified: Secondary | ICD-10-CM

## 2023-04-19 ENCOUNTER — Ambulatory Visit: Payer: No Typology Code available for payment source | Admitting: Hematology and Oncology

## 2023-05-05 ENCOUNTER — Other Ambulatory Visit: Payer: Self-pay | Admitting: *Deleted

## 2023-05-05 DIAGNOSIS — Z9189 Other specified personal risk factors, not elsewhere classified: Secondary | ICD-10-CM

## 2023-05-26 ENCOUNTER — Other Ambulatory Visit

## 2023-05-26 ENCOUNTER — Ambulatory Visit (INDEPENDENT_AMBULATORY_CARE_PROVIDER_SITE_OTHER): Admitting: Gastroenterology

## 2023-05-26 ENCOUNTER — Encounter: Payer: Self-pay | Admitting: Gastroenterology

## 2023-05-26 VITALS — BP 100/70 | HR 84 | Ht 64.25 in | Wt 146.1 lb

## 2023-05-26 DIAGNOSIS — K648 Other hemorrhoids: Secondary | ICD-10-CM

## 2023-05-26 DIAGNOSIS — K625 Hemorrhage of anus and rectum: Secondary | ICD-10-CM

## 2023-05-26 DIAGNOSIS — K5790 Diverticulosis of intestine, part unspecified, without perforation or abscess without bleeding: Secondary | ICD-10-CM | POA: Diagnosis not present

## 2023-05-26 LAB — CBC WITH DIFFERENTIAL/PLATELET
Basophils Absolute: 0.1 10*3/uL (ref 0.0–0.1)
Basophils Relative: 0.8 % (ref 0.0–3.0)
Eosinophils Absolute: 0 10*3/uL (ref 0.0–0.7)
Eosinophils Relative: 0.6 % (ref 0.0–5.0)
HCT: 42.9 % (ref 36.0–46.0)
Hemoglobin: 14.2 g/dL (ref 12.0–15.0)
Lymphocytes Relative: 26.8 % (ref 12.0–46.0)
Lymphs Abs: 1.7 10*3/uL (ref 0.7–4.0)
MCHC: 33.1 g/dL (ref 30.0–36.0)
MCV: 96.5 fl (ref 78.0–100.0)
Monocytes Absolute: 0.6 10*3/uL (ref 0.1–1.0)
Monocytes Relative: 8.7 % (ref 3.0–12.0)
Neutro Abs: 4.1 10*3/uL (ref 1.4–7.7)
Neutrophils Relative %: 63.1 % (ref 43.0–77.0)
Platelets: 219 10*3/uL (ref 150.0–400.0)
RBC: 4.45 Mil/uL (ref 3.87–5.11)
RDW: 12.7 % (ref 11.5–15.5)
WBC: 6.5 10*3/uL (ref 4.0–10.5)

## 2023-05-26 LAB — BASIC METABOLIC PANEL WITH GFR
BUN: 23 mg/dL (ref 6–23)
CO2: 26 meq/L (ref 19–32)
Calcium: 8.8 mg/dL (ref 8.4–10.5)
Chloride: 105 meq/L (ref 96–112)
Creatinine, Ser: 1.04 mg/dL (ref 0.40–1.20)
GFR: 56.98 mL/min — ABNORMAL LOW (ref >60.00)
Glucose, Bld: 132 mg/dL — ABNORMAL HIGH (ref 70–99)
Potassium: 4.5 meq/L (ref 3.5–5.1)
Sodium: 139 meq/L (ref 135–145)

## 2023-05-26 LAB — IBC + FERRITIN
Ferritin: 63.1 ng/mL (ref 10.0–291.0)
Iron: 123 ug/dL (ref 42–145)
Saturation Ratios: 38.5 % (ref 20.0–50.0)
TIBC: 319.2 ug/dL (ref 250.0–450.0)
Transferrin: 228 mg/dL (ref 212.0–360.0)

## 2023-05-26 NOTE — Progress Notes (Signed)
 Chief Complaint:rectal bleeding Primary GI Doctor:Dr. Chales Abrahams  HPI: Allison Soto is a 63 year old female with a past medical history of GERD, gastric fundic gland polyps and colon polyps. She is followed by Dr. Chales Abrahams.  Last seen by [facility] on 09/18/21.  She describes having a constant cough with frequent throat clearing which started 03/2021.  She was prescribed Pantoprazole 40 mg twice daily for 3 months by her PCP then Famotidine once daily was added. About one mont later, Pantoprazole was reduced to once daily and Famotidine was increased to twice daily.  She is currently taking Pantoprazole 40 mg once daily and Famotidine 20 mg once daily.  She was evaluated by ENT one week ago and a laryngoscopy was done which showed evidence of laryngeal reflux.  She was advised to undergo further GI evaluation/EGD.  She denies experiencing any classic heartburn symptoms.  No dysphagia.  No upper or lower abdominal pain.  She sleeps with the head of the bed elevated.  She is passing normal formed brown bowel movement daily.  No rectal bleeding or black stools.  No NSAID use.  She drinks 1 Diet Coke in the morning.  She stopped drinking carbonated water.  She underwent an EGD 07/31/2009 which showed GERD and gastric polyps.  She underwent a colonoscopy 08/25/2018 identified 2 (6mm) tubular adenomatous polyps which were removed from the ascending colon and cecum, and a 6 mm tubular adenomatous polyp was removed from the mid descending colon.  Repeat colonoscopy in 3 years was recommended.  EGD/colon ordered. Full report below.  Interval History   Patient presents with main complaint of painless rectal bleeding with defecation for about 5 days last week. She states it was like being on her period but coming from her rectum. No clots. No maroon or dark stool.She has on average 1 bowel movement daily. She states the bleeding stopped on Monday. No bowel movement yesterday. No nausea, vomiting or fever/chills. She does  endorse some fatigue, but states she has been very busy the past week. She does have hemorrhoids but states she has never had issues. No straining or long periods of sitting on toilet. No blood thinners.  Wt Readings from Last 3 Encounters:  05/26/23 146 lb 2 oz (66.3 kg)  11/25/22 144 lb (65.3 kg)  11/13/22 142 lb 6.4 oz (64.6 kg)    Past Medical History:  Diagnosis Date   Allergy    Anemia    Anxiety    Arthritis    Asthma    Depression    Family history of breast cancer    GERD (gastroesophageal reflux disease)    Hyperlipidemia    Pneumonia 1987   Pre-diabetes    Radial scar of breast    Reflux    Single cyst of left breast 02/01/12   Diagnostic mammogram and ultrasound - cyst at 2:30    Past Surgical History:  Procedure Laterality Date   BREAST LUMPECTOMY WITH RADIOACTIVE SEED LOCALIZATION Left 12/21/2019   Procedure: LEFT BREAST LUMPECTOMY WITH RADIOACTIVE SEED LOCALIZATION;  Surgeon: Harriette Bouillon, MD;  Location: [facility];  Service: General;  Laterality: Left;   CESAREAN SECTION  1993, 1995   COLONOSCOPY     INTRAUTERINE DEVICE (IUD) INSERTION  08/19/2015   Mirena   POLYPECTOMY     UPPER GASTROINTESTINAL ENDOSCOPY     WISDOM TOOTH EXTRACTION      Current Outpatient Medications  Medication Sig Dispense Refill   Albuterol-Budesonide (AIRSUPRA) 90-80 MCG/ACT AERO Inhale 2 puffs into  the lungs as needed (every four to six hours for cough, wheeze, shortness of breath.  Rinse, gargle, and spit after use). 10.7 g 1   budesonide-formoterol (SYMBICORT) 160-4.5 MCG/ACT inhaler Inhale 2 puffs into the lungs 2 (two) times daily.     Cholecalciferol (VITAMIN D-3) 1000 units CAPS Take 1 capsule by mouth daily.     Coenzyme Q10 (CO Q 10 PO) Take by mouth.     CRESTOR 5 MG tablet Take 5 mg by mouth daily.     dexlansoprazole (DEXILANT) 60 MG capsule Take 60 mg by mouth daily.     famotidine (PEPCID) 40 MG tablet TAKE 1 TABLET BY MOUTH EVERY DAY IN THE EVENING  30 tablet 5   fluvoxaMINE (LUVOX) 100 MG tablet Take 100 mg by mouth at bedtime.     ipratropium-albuterol (DUONEB) 0.5-2.5 (3) MG/3ML SOLN SMARTSIG:3 Milliliter(s) Via Nebulizer Every 6 Hours PRN     ketoconazole (NIZORAL) 2 % shampoo Apply topically 3 (three) times a week.     meloxicam (MOBIC) 15 MG tablet Take 1 tablet by mouth daily.     minoxidil (LONITEN) 2.5 MG tablet Take by mouth.     Multiple Vitamin (MULTIVITAMIN WITH MINERALS) TABS tablet Take 1 tablet by mouth daily.     RYALTRIS 665-25 MCG/ACT SUSP Place 2 sprays into both nostrils 2 (two) times daily.     spironolactone (ALDACTONE) 50 MG tablet Take 50 mg by mouth 2 (two) times daily.     tamoxifen (NOLVADEX) 20 MG tablet Take 1 tablet (20 mg total) by mouth daily. 30 tablet 11   TEZSPIRE 210 MG/1. SOAJ Inject 210 mg into the skin every 28 (twenty-eight) days. 1.91 mL 11   traZODone (DESYREL) 25 mg TABS tablet Take 25 mg by mouth at bedtime.     EPINEPHrine (EPIPEN 2-PAK) 0.3 mg/0.3 mL IJ SOAJ injection Use as directed for life-threatening allergic reaction. (Patient not taking: Reported on 05/26/2023) 2 each 2   No current facility-administered medications for this visit.    Allergies as of 05/26/2023 - Review Complete 05/26/2023  Allergen Reaction Noted   Shellfish allergy Anaphylaxis 04/26/2012   Penicillins Other (See Comments) 04/26/2012    Family History  Problem Relation Age of Onset   Breast cancer Mother 51   Depression Mother    Anxiety disorder Mother    Osteoporosis Mother    Thyroid disease Mother        on synthroid   Depression Sister    Anxiety disorder Sister    Stroke Father        died 02-21-2012   Dementia Father    Alzheimer's disease Paternal Aunt    Breast cancer Cousin        triple negative   Breast cancer Cousin        mom's cousin   Colon cancer Neg Hx    Colon polyps Neg Hx    Esophageal cancer Neg Hx    Rectal cancer Neg Hx    Stomach cancer Neg Hx     Review of Systems:     Constitutional: No weight loss, fever, chills, weakness or fatigue HEENT: Eyes: No change in vision               Ears, Nose, Throat:  No change in hearing or congestion Skin: No rash or itching Cardiovascular: No chest pain, chest pressure or palpitations   Respiratory: No SOB or cough Gastrointestinal: See HPI and otherwise negative Genitourinary: No dysuria or change in urinary  frequency Neurological: No headache, dizziness or syncope Musculoskeletal: No new muscle or joint pain Hematologic: No bleeding or bruising Psychiatric: No history of depression or anxiety    Physical Exam:  Vital signs: BP 100/70 (BP Location: Left Arm, Patient Position: Sitting, Cuff Size: Normal)   Pulse 84   Ht 5' 4.25" (1.632 m) Comment: height measured without shoes  Wt 146 lb 2 oz (66.3 kg)   LMP 01/23/2017   BMI 24.89 kg/m   Constitutional: Pleasant  female appears to be in NAD, Well developed, Well nourished, alert and cooperative Throat: Oral cavity and pharynx without inflammation, swelling or lesion.  Respiratory: Respirations even and unlabored. Lungs clear to auscultation bilaterally.   No wheezes, crackles, or rhonchi.  Cardiovascular: Normal S1, S2. Regular rate and rhythm. No peripheral edema, cyanosis or pallor.  Gastrointestinal:  Soft, nondistended, nontender. No rebound or guarding. Normal bowel sounds. No appreciable masses or hepatomegaly. Rectal:  Not performed.  Msk:  Symmetrical without gross deformities. Without edema, no deformity or joint abnormality.  Neurologic:  Alert and  oriented x4;  grossly normal neurologically.  Skin:   Dry and intact without significant lesions or rashes. Psychiatric: Oriented to person, place and time. Demonstrates good judgement and reason without abnormal affect or behaviors.  RELEVANT LABS AND IMAGING: CBC    Latest Ref Rng & Units 03/19/2022   11:18 AM 12/18/2019    9:30 AM 09/22/2013    4:21 PM  CBC  WBC 3.4 - 10.8 x10E3/uL 9.1  5.4  8.9    Hemoglobin 11.1 - 15.9 g/dL 16.1  09.6  04.5   Hematocrit 34.0 - 46.6 % 44.8  47.5  44.0   Platelets 150 - 450 x10E3/uL 248  247  243      CMP     Latest Ref Rng & Units 12/18/2019    9:30 AM  CMP  Glucose 70 - 99 mg/dL 409   BUN 6 - 20 mg/dL 12   Creatinine 8.11 - 1.00 mg/dL 9.14   Sodium 782 - 956 mmol/L 140   Potassium 3.5 - 5.1 mmol/L 5.0   Chloride 98 - 111 mmol/L 103   CO2 22 - 32 mmol/L 29   Calcium 8.9 - 10.3 mg/dL 9.4   Total Protein 6.5 - 8.1 g/dL 6.3   Total Bilirubin 0.3 - 1.2 mg/dL 0.3   Alkaline Phos 38 - 126 U/L 44   AST 15 - 41 U/L 29   ALT 0 - 44 U/L 32      Lab Results  Component Value Date   TSH 1.222 09/22/2013   EGD 09/24/21 Impression: - Z- line irregular, 35 cm from the incisors. Biopsied. - 2 cm hiatal hernia. - A single polyp at the diaphragmatic hiatus s/ p hot snare polypectomy - Multiple gastric polyps. Resected. Path: 1. Surgical [P], gastric antrum and gastric body - ANTRAL AND OXYNTIC MUCOSA WITH NO SIGNIFICANT PATHOLOGY. - NO HELICOBACTER PYLORI ORGANISMS IDENTIFIED ON H&E STAINED SLIDE. 2. Surgical [P], distal esophagus - SQUAMOCOLUMNAR JUNCTIONAL MUCOSA WITH MILD FEATURES SUGGESTIVE OF REFLUX. - NEGATIVE FOR INTESTINAL METAPLASIA. 3. Surgical [P], mid/proximal esophagus - SQUAMOUS MUCOSA WITH MILD BASAL CELL HYPERPLASIA, NONSPECIFIC. 4. Surgical [P], GE junction polyp, polyp (1) - CARDIO-OXYNTIC MUCOSA WITH POLYPOID FOVEOLAR HYPERPLASIA, DILATED GLANDS, LAMINA PROPRIA EDEMA AND FOCAL EVIDENCE OF EROSION CONSISTENT WITH A HYPERPLASTIC POLYP, NEGATIVE FOR DYSPLASIA. 5. Surgical [P], gastric polyps - FUNDIC GLAND POLYPS. Colonoscopy 09/24/2021, recall 3 years Impression: - Three 6 to 10 mm polyps in the  proximal sigmoid colon and in the mid sigmoid colon, removed with a hot snare. Resected and retrieved. - Diverticulosis in the sigmoid colon. - One polyp. - Non- bleeding internal hemorrhoids. - The examination was otherwise normal on direct  and retroflexion views. Path: Surgical [P], colon, sigmoid, polyp (3) - TUBULAR ADENOMA, FRAGMENTS.  Colonoscopy 08/25/2018: - Two sessile polyps were found in the ascending colon and cecum. The polyps were 6 mm in size. These polyps were removed with a cold snare. Resection and retrieval were complete. - A 6 mm polyp was found in the mid descending colon. The polyp was sessile. The polyp was removed with a cold snare. Resection and retrieval were complete. - Multiple medium-mouthed diverticula were found in the sigmoid colon. - Non-bleeding internal hemorrhoids were found during retroflexion. The hemorrhoids were moderate. - The exam was otherwise without abnormality on direct and retroflexion views. -- 3 year colonoscopy recall 1. Surgical [P], colon, ascending and cecum, polyp (2) - TUBULAR ADENOMA (3 OF 3 FRAGMENTS) - NO HIGH GRADE DYSPLASIA OR MALIGNANCY IDENTIFIED 2. Surgical [P], colon, descending, polyp - TUBULAR ADENOMA (1 OF 1 FRAGMENTS) - NO HIGH GRADE DYSPLASIA OR MALIGNANCY IDENTIFIED   Colonoscopy 11/09/2012: One 6mm tubular adenomatous polyp was removed from the cecum Mild sigmoid diverticulosis   EGD 07/31/2009: Irregular Z-line suggestive of reflux Fundic gland polyps   Assessment: Encounter Diagnoses  Name Primary?   Rectal bleeding Yes   Diverticulosis    Internal hemorrhoids      63 year old female patient that presents for painless rectal bleeding that lasted for about 5 days.  Last bloody bowel movement was on Monday.  Suspect possible diverticular bleed versus internal hemorrhoids?  Patient has history of diverticular disease as well as internal hemorrhoids.  Patient denies diarrhea or constipation.  She is not currently on any blood thinners. 04/23/23 Hgb 13.4 (reported by patients phone), will recheck CBC today with iron panel to rule out anemia.  Patient does endorse some fatigue.  Recommended she incorporate more fiber into her diet to keep her bowels  regular.  If rectal bleeding was to recur would consider CTA.  ER precautions given.  Plan: - Check CBC , BMP, iron panel, ferritin today -Start Citrucel 1 tsp po daily -Advised to go to the ER if there is any severe weakness, severe abdominal pain, vomit blood, dark red blood in your bowel movement, shortness of breath or chest pain.   -recall colonoscopy 09/2024 history of tubular adenomas   Thank you for the courtesy of this consult. Please call me with any questions or concerns.   Deepika Decatur, FNP-C [facility] 05/26/2023, 4:08 PM  Cc: Harvest Lineman, MD

## 2023-05-26 NOTE — Patient Instructions (Addendum)
 OTC Citrucel 1 tsp po daily Keep bowels regular Advised to go to the ER if there is any severe weakness, severe abdominal pain, vomit blood, dark red blood in your bowel movement, shortness of breath or chest pain.   Your provider has requested that you go to the basement level for lab work before leaving today. Press "B" on the elevator. The lab is located at the first door on the left as you exit the elevator.  _______________________________________________________  If your blood pressure at your visit was 140/90 or greater, please contact your primary care physician to follow up on this.  _______________________________________________________  If you are age 85 or older, your body mass index should be between 23-30. Your Body mass index is 24.89 kg/m. If this is out of the aforementioned range listed, please consider follow up with your Primary Care Provider.  If you are age 30 or younger, your body mass index should be between 19-25. Your Body mass index is 24.89 kg/m. If this is out of the aformentioned range listed, please consider follow up with your Primary Care Provider.   ________________________________________________________  The [facility] providers would like to encourage you to use MYCHART to communicate with providers for non-urgent requests or questions.  Due to long hold times on the telephone, sending your provider a message by [facility] may be a faster and more efficient way to get a response.  Please allow 48 business hours for a response.  Please remember that this is for non-urgent requests.  _______________________________________________________  Thank you for trusting me with your gastrointestinal care!   Dyanna Glasgow, NP

## 2023-05-27 ENCOUNTER — Ambulatory Visit: Payer: No Typology Code available for payment source | Admitting: Allergy and Immunology

## 2023-05-27 ENCOUNTER — Encounter: Payer: Self-pay | Admitting: Allergy and Immunology

## 2023-05-27 VITALS — BP 110/62 | HR 90 | Resp 16 | Ht 63.7 in | Wt 146.4 lb

## 2023-05-27 DIAGNOSIS — K219 Gastro-esophageal reflux disease without esophagitis: Secondary | ICD-10-CM | POA: Diagnosis not present

## 2023-05-27 DIAGNOSIS — J3089 Other allergic rhinitis: Secondary | ICD-10-CM | POA: Diagnosis not present

## 2023-05-27 DIAGNOSIS — Z91018 Allergy to other foods: Secondary | ICD-10-CM | POA: Diagnosis not present

## 2023-05-27 DIAGNOSIS — J4540 Moderate persistent asthma, uncomplicated: Secondary | ICD-10-CM

## 2023-05-27 NOTE — Patient Instructions (Signed)
  1.  Treat and prevent inflammation:  A. Symbicort 160 - 2 inhalations 1-2 times per day (empty lungs) B. Ryaltris - 2 spray each nostril 1-2 times per day C. Tezepelumab injections every 28 days  2.  Treat and prevent reflux/LPR:  A. Dexilant 60 mg in PM B. Famotidine 40 mg - 1 tablet in AM C. Replace throat clearing with swallowing/drinking maneuver  3.  If needed:  A. AirSupra - 2 inhalations every 4-6 hours B. Mucinex DM - 2 times per day C. Carbinoxamine 4 mg - 1-2 tablets 1-2 times per day D. Epi-pen  4. Return to clinic in 12 months or earlier if problem  5. Influenza = Tamiflu. Covid = Paxlovid

## 2023-05-27 NOTE — Progress Notes (Signed)
 [place] - [place] - [place] - [place] - [place]   Follow-up Note  Referring Provider: Buckner Malta, MD Primary Provider: Buckner Malta, MD Date of Office Visit: 05/27/2023  Subjective:   Allison Soto (DOB: [birth year]) is a 63 y.o. female who [facility] in re-evaluation of the following:  HPI: Allison Soto returns to this clinic in reevaluation of asthma, allergic rhinitis, LPR, shellfish allergy.  I last saw her in this clinic 25 November 2022.  She has really done very well since her last visit without the need for systemic steroid or antibiotic for any type of airway issue.  She did contract COVID but was successfully treated with Paxlovid which gave rise to a very truncated illness.  Rarely does she use the short acting bronchodilator and she can exercise without any problem.  She continues on a combination of anti-TSL P antibody, Symbicort, and rarely a combination nasal steroid/antihistamine.  Her reflux is under very good control on her current plan of her proton pump inhibitor and H2 receptor blocker.  She does not consume shellfish.  Allergies as of 05/27/2023       Reactions   Shellfish Allergy Anaphylaxis   Throat closes.   Penicillins Other (See Comments)   Mouth sores.        Medication List    Airsupra 90-80 MCG/ACT Aero Generic drug: Albuterol-Budesonide Inhale 2 puffs into the lungs as needed (every four to six hours for cough, wheeze, shortness of breath.  Rinse, gargle, and spit after use).   budesonide-formoterol 160-4.5 MCG/ACT inhaler Commonly known as: SYMBICORT Inhale 2 puffs into the lungs 2 (two) times daily.   CO Q 10 PO Take by mouth.   Crestor 5 MG tablet Generic drug: rosuvastatin Take 5 mg by mouth daily.   dexlansoprazole 60 MG capsule Commonly known as: DEXILANT Take 60 mg by mouth daily.   EPINEPHrine 0.3 mg/0.3 mL Soaj injection Commonly known as: EpiPen  2-Pak Use as directed for life-threatening allergic reaction.   famotidine 40 MG tablet Commonly known as: PEPCID TAKE 1 TABLET BY MOUTH EVERY DAY IN THE EVENING   fluvoxaMINE 100 MG tablet Commonly known as: LUVOX Take 100 mg by mouth at bedtime.   ipratropium-albuterol 0.5-2.5 (3) MG/3ML Soln Commonly known as: DUONEB SMARTSIG:3 Milliliter(s) Via Nebulizer Every 6 Hours PRN   ketoconazole 2 % shampoo Commonly known as: NIZORAL Apply topically 3 (three) times a week.   meloxicam 15 MG tablet Commonly known as: MOBIC Take 1 tablet by mouth daily.   minoxidil 2.5 MG tablet Commonly known as: LONITEN Take by mouth.   multivitamin with minerals Tabs tablet Take 1 tablet by mouth daily.   Ryaltris 409-81 MCG/ACT Susp Generic drug: Olopatadine-Mometasone Place 2 sprays into both nostrils 2 (two) times daily.   spironolactone 50 MG tablet Commonly known as: ALDACTONE Take 50 mg by mouth 2 (two) times daily.   tamoxifen 20 MG tablet Commonly known as: NOLVADEX Take 1 tablet (20 mg total) by mouth daily.   Tezspire 210 MG/1. Soaj Generic drug: Tezepelumab-ekko Inject 210 mg into the skin every 28 (twenty-eight) days.   traZODone 25 mg Tabs tablet Commonly known as: DESYREL Take 25 mg by mouth at bedtime.   Vitamin D-3 25 MCG (1000 UT) Caps Take 1 capsule by mouth daily.    Past Medical History:  Diagnosis Date   Allergy    Anemia    Anxiety    Arthritis    Asthma  Depression    Family history of breast cancer    GERD (gastroesophageal reflux disease)    Hyperlipidemia    Pneumonia 1987   Pre-diabetes    Radial scar of breast    Reflux    Single cyst of left breast 02/01/12   Diagnostic mammogram and ultrasound - cyst at 2:30    Past Surgical History:  Procedure Laterality Date   BREAST LUMPECTOMY WITH RADIOACTIVE SEED LOCALIZATION Left 12/21/2019   Procedure: LEFT BREAST LUMPECTOMY WITH RADIOACTIVE SEED LOCALIZATION;  Surgeon: Sim Dryer,  MD;  Location: [facility];  Service: General;  Laterality: Left;   CESAREAN SECTION  1993, 1995   COLONOSCOPY     INTRAUTERINE DEVICE (IUD) INSERTION  08/19/2015   Mirena   POLYPECTOMY     UPPER GASTROINTESTINAL ENDOSCOPY     WISDOM TOOTH EXTRACTION      Review of systems negative except as noted in HPI / PMHx or noted below:  Review of Systems  Constitutional: Negative.   HENT: Negative.    Eyes: Negative.   Respiratory: Negative.    Cardiovascular: Negative.   Gastrointestinal: Negative.   Genitourinary: Negative.   Musculoskeletal: Negative.   Skin: Negative.   Neurological: Negative.   Endo/Heme/Allergies: Negative.   Psychiatric/Behavioral: Negative.       Objective:   Vitals:   05/27/23 1347  BP: 110/62  Pulse: 90  Resp: 16  SpO2: 98%   Height: 5' 3.7" (161.8 cm)  Weight: 146 lb 6.4 oz (66.4 kg)   Physical Exam Constitutional:      Appearance: She is not diaphoretic.  HENT:     Head: Normocephalic.     Right Ear: Tympanic membrane, ear canal and external ear normal.     Left Ear: Tympanic membrane, ear canal and external ear normal.     Nose: Nose normal. No mucosal edema or rhinorrhea.     Mouth/Throat:     Pharynx: Uvula midline. No oropharyngeal exudate.  Eyes:     Conjunctiva/sclera: Conjunctivae normal.  Neck:     Thyroid: No thyromegaly.     Trachea: Trachea normal. No tracheal tenderness or tracheal deviation.  Cardiovascular:     Rate and Rhythm: Normal rate and regular rhythm.     Heart sounds: Normal heart sounds, S1 normal and S2 normal. No murmur heard. Pulmonary:     Effort: No respiratory distress.     Breath sounds: Normal breath sounds. No stridor. No wheezing or rales.  Lymphadenopathy:     Head:     Right side of head: No tonsillar adenopathy.     Left side of head: No tonsillar adenopathy.     Cervical: No cervical adenopathy.  Skin:    Findings: No erythema or rash.     Nails: There is no clubbing.   Neurological:     Mental Status: She is alert.     Diagnostics: Spirometry was performed and demonstrated an FEV1 of 1.90 at 81 % of predicted.   Assessment and Plan:   1. Asthma, moderate persistent, well-controlled   2. Perennial allergic rhinitis   3. LPRD (laryngopharyngeal reflux disease)   4. Food allergy    1.  Treat and prevent inflammation:  A. Symbicort 160 - 2 inhalations 1-2 times per day (empty lungs) B. Ryaltris - 2 spray each nostril 1-2 times per day C. Tezepelumab injections every 28 days  2.  Treat and prevent reflux/LPR:  A. Dexilant 60 mg in PM B. Famotidine 40 mg - 1 tablet  in AM C. Replace throat clearing with swallowing/drinking maneuver  3.  If needed:  A. AirSupra - 2 inhalations every 4-6 hours B. Mucinex DM - 2 times per day C. Carbinoxamine 4 mg - 1-2 tablets 1-2 times per day D. Epi-pen  4. Return to clinic in 12 months or earlier if problem  5. Influenza = Tamiflu. Covid = Paxlovid  Margretta Shi is really doing very well and she will continue to use anti-inflammatory agents for airway including the use of anti-TSL P antibody.  She also continue to address her reflux aggressively as noted above which appears to be working quite well.  If she does well I will see her back in this clinic in 1 year or earlier if there is a problem.  Schuyler Custard, MD Allergy / Immunology [facility]

## 2023-05-27 NOTE — Progress Notes (Signed)
 Agree with assessment/plan. Hb 14  Magnus Schuller, MD [facility] [phone]

## 2023-05-31 ENCOUNTER — Encounter: Payer: Self-pay | Admitting: Allergy and Immunology

## 2023-06-17 ENCOUNTER — Ambulatory Visit
Admission: RE | Admit: 2023-06-17 | Discharge: 2023-06-17 | Disposition: A | Discharge status: Home / Self Care | Source: Ambulatory Visit | Attending: Hematology and Oncology | Admitting: Hematology and Oncology

## 2023-06-17 DIAGNOSIS — Z9189 Other specified personal risk factors, not elsewhere classified: Secondary | ICD-10-CM

## 2023-06-17 MED ORDER — GADOPICLENOL 0.5 MMOL/ML IV SOLN
6.0000 mL | Freq: Once | INTRAVENOUS | Status: AC | PRN
  Administered 2023-06-17: 6 mL via INTRAVENOUS

## 2023-06-22 ENCOUNTER — Ambulatory Visit (HOSPITAL_BASED_OUTPATIENT_CLINIC_OR_DEPARTMENT_OTHER): Payer: No Typology Code available for payment source | Admitting: Obstetrics & Gynecology

## 2023-07-22 ENCOUNTER — Encounter: Payer: Self-pay | Admitting: Allergy and Immunology

## 2023-07-22 ENCOUNTER — Ambulatory Visit (INDEPENDENT_AMBULATORY_CARE_PROVIDER_SITE_OTHER): Admitting: Allergy and Immunology

## 2023-07-22 VITALS — BP 104/72 | HR 84 | Resp 10

## 2023-07-22 DIAGNOSIS — J3089 Other allergic rhinitis: Secondary | ICD-10-CM

## 2023-07-22 DIAGNOSIS — J4540 Moderate persistent asthma, uncomplicated: Secondary | ICD-10-CM | POA: Diagnosis not present

## 2023-07-22 DIAGNOSIS — K219 Gastro-esophageal reflux disease without esophagitis: Secondary | ICD-10-CM

## 2023-07-22 MED ORDER — VOQUEZNA 20 MG PO TABS: 1.0000 | ORAL_TABLET | Freq: Every morning | ORAL | 5 refills | Status: DC

## 2023-07-22 NOTE — Progress Notes (Signed)
 [place] [facility] [place] [facility] [place] [facility] [place] [facility] [place]   Follow-up Note  Referring Provider: Harvest Lineman, MD Primary Provider: Harvest Lineman, MD Date of Office Visit: 07/22/2023  Subjective:   Allison Soto (DOB: 1960/07/09) is a 63 y.o. female who [facility] in re-evaluation of the following:  HPI: Kennetha returns to this clinic in reevaluation of asthma, allergic rhinitis, LPR, shellfish allergy.  I last saw her in this clinic 27 May 2023.  She is here today because she is having significant problems with reflux over the course of the past 2 weeks.  She has been having burning in her chest all the way up into her throat and she is getting a Academic librarian.  This occurs even though she has been using her Dexilant  and her famotidine .  Her insurance company will no longer fill her Dexilant .  She has a cheer wine soda in the morning and she has 2 iced teas at lunch and does not really consume any chocolate and does not consume alcohol.  She has increased her Luvox about a month ago and she started meloxicam about 3 months ago.  She has really had very little issue with her airway and her asthma and her allergic rhinoconjunctivitis are not an active issue at this point in time while she continues on her tezepelumab  and Symbicort  and occasionally some other medications utilized as needed.  She does not consume shellfish.  Allergies as of 07/22/2023       Reactions   Shellfish Allergy Anaphylaxis   Throat closes.   Penicillins Other (See Comments)   Mouth sores.        Medication List    Airsupra  90-80 MCG/ACT Aero Generic drug: Albuterol -Budesonide  Inhale 2 puffs into the lungs as needed (every four to six hours for cough, wheeze, shortness of breath.  Rinse, gargle, and spit after use).   budesonide -formoterol  160-4.5 MCG/ACT inhaler Commonly known as: SYMBICORT  Inhale 2 puffs into the lungs 2 (two) times  daily.   CO Q 10 PO Take by mouth.   Crestor 5 MG tablet Generic drug: rosuvastatin Take 5 mg by mouth daily.   dexlansoprazole  60 MG capsule Commonly known as: DEXILANT  Take 60 mg by mouth daily.   EPINEPHrine  0.3 mg/0.3 mL Soaj injection Commonly known as: EpiPen  2-Pak Use as directed for life-threatening allergic reaction.   famotidine  40 MG tablet Commonly known as: PEPCID  TAKE 1 TABLET BY MOUTH EVERY DAY IN THE EVENING   fluvoxaMINE 100 MG tablet Commonly known as: LUVOX Take 100 mg by mouth at bedtime.   ipratropium-albuterol  0.5-2.5 (3) MG/3ML Soln Commonly known as: DUONEB SMARTSIG:3 Milliliter(s) Via Nebulizer Every 6 Hours PRN   ketoconazole 2 % shampoo Commonly known as: NIZORAL Apply topically 3 (three) times a week.   meloxicam 15 MG tablet Commonly known as: MOBIC Take 1 tablet by mouth daily.   minoxidil 2.5 MG tablet Commonly known as: LONITEN Take by mouth.   multivitamin with minerals Tabs tablet Take 1 tablet by mouth daily.   Ryaltris  665-25 MCG/ACT Susp Generic drug: Olopatadine-Mometasone Place 2 sprays into both nostrils 2 (two) times daily.   spironolactone 50 MG tablet Commonly known as: ALDACTONE Take 50 mg by mouth 2 (two) times daily.   tamoxifen  20 MG tablet Commonly known as: NOLVADEX  Take 1 tablet (20 mg total) by mouth daily.   Tezspire  210 MG/1. Soaj Generic drug: Tezepelumab -ekko Inject 210 mg into the skin every 28 (twenty-eight)  days.   traZODone 25 mg Tabs tablet Commonly known as: DESYREL Take 25 mg by mouth at bedtime.   Vitamin D -3 25 MCG (1000 UT) Caps Take 1 capsule by mouth daily.    Past Medical History:  Diagnosis Date   Allergy    Anemia    Anxiety    Arthritis    Asthma    Depression    Family history of breast cancer    GERD (gastroesophageal reflux disease)    Hyperlipidemia    Pneumonia 1987   Pre-diabetes    Radial scar of breast    Reflux    Single cyst of left breast 02/01/12    Diagnostic mammogram and ultrasound - cyst at 2:30    Past Surgical History:  Procedure Laterality Date   BREAST LUMPECTOMY WITH RADIOACTIVE SEED LOCALIZATION Left 12/21/2019   Procedure: LEFT BREAST LUMPECTOMY WITH RADIOACTIVE SEED LOCALIZATION;  Surgeon: Sim Dryer, MD;  Location: [facility];  Service: General;  Laterality: Left;   CESAREAN SECTION  1993, 1995   COLONOSCOPY     INTRAUTERINE DEVICE (IUD) INSERTION  08/19/2015   Mirena    POLYPECTOMY     UPPER GASTROINTESTINAL ENDOSCOPY     WISDOM TOOTH EXTRACTION      Review of systems negative except as noted in HPI / PMHx or noted below:  Review of Systems  Constitutional: Negative.   HENT: Negative.    Eyes: Negative.   Respiratory: Negative.    Cardiovascular: Negative.   Gastrointestinal: Negative.   Genitourinary: Negative.   Musculoskeletal: Negative.   Skin: Negative.   Neurological: Negative.   Endo/Heme/Allergies: Negative.   Psychiatric/Behavioral: Negative.       Objective:   Vitals:   07/22/23 1638  BP: 104/72  Pulse: 84  Resp: 10  SpO2: 94%          Physical Exam Constitutional:      Appearance: She is not diaphoretic.  HENT:     Head: Normocephalic.     Right Ear: Tympanic membrane, ear canal and external ear normal.     Left Ear: Tympanic membrane, ear canal and external ear normal.     Nose: Nose normal. No mucosal edema or rhinorrhea.     Mouth/Throat:     Pharynx: Uvula midline. No oropharyngeal exudate.   Eyes:     Conjunctiva/sclera: Conjunctivae normal.   Neck:     Thyroid: No thyromegaly.     Trachea: Trachea normal. No tracheal tenderness or tracheal deviation.   Cardiovascular:     Rate and Rhythm: Normal rate and regular rhythm.     Heart sounds: Normal heart sounds, S1 normal and S2 normal. No murmur heard. Pulmonary:     Effort: No respiratory distress.     Breath sounds: Normal breath sounds. No stridor. No wheezing or rales.  Lymphadenopathy:      Head:     Right side of head: No tonsillar adenopathy.     Left side of head: No tonsillar adenopathy.     Cervical: No cervical adenopathy.   Skin:    Findings: No erythema or rash.     Nails: There is no clubbing.   Neurological:     Mental Status: She is alert.     Diagnostics: none  Assessment and Plan:   1. Asthma, moderate persistent, well-controlled   2. Perennial allergic rhinitis   3. LPRD (laryngopharyngeal reflux disease)    1.  Treat and prevent inflammation:  A. Symbicort  160 - 2 inhalations 1-2  times per day (empty lungs) B. Tezepelumab  injections every 28 days  2.  Treat and prevent reflux/LPR:  A. Voquezna  20 mg - 1 tablet in AM B. Famotidine  40 mg - 1 tablet in PM C. Decrease caffeine consumption D. Replace throat clearing with swallowing/drinking maneuver E. Nissen fundoplication??? F. Side effect from Luvox or meloxicam???  3.  If needed:  A. AirSupra  - 2 inhalations every 4-6 hours B. Mucinex DM - 2 times per day C. Carbinoxamine  4 mg - 1-2 tablets 1-2 times per day D. Epi-pen E. Ryaltris  - 2 spray each nostril 1-2 times per day  4. Return to clinic in 12 months or earlier if problem  5. Influenza = Tamiflu. Covid = Paxlovid  Laurieanne has very bad reflux and this has been a problem for a prolonged period in time but it has really gotten out of hand recently.  I did make some recommendations about her caffeine consumption and we will try her on a different form of therapy for her reflux as noted above and she may need to consider a Nissen fundoplication if she continues to have such significant problems with reflux.  There is the possibility that she is having a side effect from her Luvox and her meloxicam interfering with her antireflux mechanism and she can work through that issue by decreasing her Luvox to its original dose or possibly stopping her meloxicam.  She will keep in contact with me noting her response to this approach.  Her airway issue  is under excellent control would not really get a change her therapy for that condition.  Schuyler Custard, MD Allergy / Immunology [facility]

## 2023-07-22 NOTE — Patient Instructions (Addendum)
  1.  Treat and prevent inflammation:  A. Symbicort  160 - 2 inhalations 1-2 times per day (empty lungs) B. Tezepelumab  injections every 28 days  2.  Treat and prevent reflux/LPR:  A. Voquezna 20 mg - 1 tablet in AM B. Famotidine  40 mg - 1 tablet in PM C. Decrease caffeine consumption D. Replace throat clearing with swallowing/drinking maneuver E. Nissen fundoplication??? F. Side effect from Luvox or meloxicam???  3.  If needed:  A. AirSupra  - 2 inhalations every 4-6 hours B. Mucinex DM - 2 times per day C. Carbinoxamine  4 mg - 1-2 tablets 1-2 times per day D. Epi-pen E. Ryaltris  - 2 spray each nostril 1-2 times per day  4. Return to clinic in 12 months or earlier if problem  5. Influenza = Tamiflu. Covid = Paxlovid

## 2023-07-26 ENCOUNTER — Encounter: Payer: Self-pay | Admitting: Allergy and Immunology

## 2023-09-08 ENCOUNTER — Encounter: Payer: Self-pay | Admitting: Allergy and Immunology

## 2023-09-09 MED ORDER — NEFFY 2 MG/0.1ML NA SOLN: 1.0000 | NASAL | 3 refills | Status: AC | PRN

## 2023-09-09 NOTE — Telephone Encounter (Signed)
 Ok to send in Neffy ? Please advise.

## 2023-09-24 ENCOUNTER — Inpatient Hospital Stay
Payer: No Typology Code available for payment source | Attending: Hematology and Oncology | Admitting: Hematology and Oncology

## 2023-09-24 VITALS — BP 100/78 | HR 89 | Temp 97.4°F | Resp 18 | Ht 63.75 in | Wt 148.4 lb

## 2023-09-24 DIAGNOSIS — K219 Gastro-esophageal reflux disease without esophagitis: Secondary | ICD-10-CM | POA: Diagnosis not present

## 2023-09-24 DIAGNOSIS — E785 Hyperlipidemia, unspecified: Secondary | ICD-10-CM | POA: Insufficient documentation

## 2023-09-24 DIAGNOSIS — N62 Hypertrophy of breast: Secondary | ICD-10-CM | POA: Insufficient documentation

## 2023-09-24 DIAGNOSIS — Z791 Long term (current) use of non-steroidal anti-inflammatories (NSAID): Secondary | ICD-10-CM | POA: Diagnosis not present

## 2023-09-24 DIAGNOSIS — Z79899 Other long term (current) drug therapy: Secondary | ICD-10-CM | POA: Diagnosis not present

## 2023-09-24 DIAGNOSIS — Z79810 Long term (current) use of selective estrogen receptor modulators (SERMs): Secondary | ICD-10-CM | POA: Insufficient documentation

## 2023-09-24 DIAGNOSIS — Z803 Family history of malignant neoplasm of breast: Secondary | ICD-10-CM | POA: Diagnosis not present

## 2023-09-24 DIAGNOSIS — Z9189 Other specified personal risk factors, not elsewhere classified: Secondary | ICD-10-CM

## 2023-09-24 DIAGNOSIS — D649 Anemia, unspecified: Secondary | ICD-10-CM | POA: Diagnosis not present

## 2023-09-24 DIAGNOSIS — Z7951 Long term (current) use of inhaled steroids: Secondary | ICD-10-CM | POA: Diagnosis not present

## 2023-09-24 MED ORDER — TAMOXIFEN CITRATE 20 MG PO TABS: 20.0000 mg | ORAL_TABLET | Freq: Every day | ORAL | 3 refills | Status: AC

## 2023-09-24 NOTE — Progress Notes (Signed)
 [facility] CONSULT NOTE  Patient Care Team: Clemmie Nest, MD as PCP - General (Family Medicine) Lonni Slain, MD as PCP - Cardiology (Cardiology)  CHIEF COMPLAINTS/PURPOSE OF CONSULTATION:  High risk for breast cancer.  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  She is currently on tamoxifen for prevention.      Breast Cancer Risk Patient with a history of radial scar and a lifetime risk of breast cancer of 30%. Currently on Tamoxifen with tolerable side effects. Recent trauma to the breast from a dog scratch, but no palpable abnormalities on examination. -Continue Tamoxifen. -Perform mammogram today as scheduled. -Order MRI for June 01, 2023. -Return in 6 months for follow-up and breast examination.  General Health Maintenance Discussed the importance of regular exercise and maintaining an active lifestyle. -Encouraged to incorporate regular physical activity into daily routine.    Thank you for consulting on the care of this patient.  Please not hesitate to contact us  with any additional questions or concerns  HISTORY OF PRESENTING ILLNESS:   Allison Soto 63 y.o. female is here because of high risk of breast cancer.  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  She is here for follow up on Tamoxifen.  Discussed the use of AI scribe software for clinical note transcription with the patient, who gave verbal consent to proceed.  History of Present Illness    Discussed the use of AI scribe software for clinical note transcription with the patient, who gave verbal consent to proceed.  History of Present Illness Allison Soto is a 63 year old female with breast cancer who presents for follow-up on tamoxifen therapy.  She has been on tamoxifen since August 2022 following her surgery in November 2021. She experiences no side effects from the medication and feels 'blessed' for this. She is due for a refill and prefers a 90-day supply through [facility].  Her last MRI was in May, and her mammogram is scheduled for October. She performs monthly breast exams and has not noticed any abnormalities.  She reports a fall during a Halloween event where she sustained a scar from tripping over a wagon. She has noticed her skin thinning, particularly on her forearms, which bruise easily. She attributes this to aging.  No bleeding, leg swelling, shortness of breath, or chest pain. She is able to exercise without difficulty.   MEDICAL HISTORY:  Past Medical History:  Diagnosis Date   Allergy    Anemia    Anxiety    Arthritis    Asthma    Depression    Family history of breast cancer    GERD (gastroesophageal reflux disease)    Hyperlipidemia    Pneumonia 1987   Pre-diabetes    Radial scar of breast    Reflux    Single cyst of left breast 02/01/12   Diagnostic mammogram  and ultrasound - cyst at 2:30    SURGICAL HISTORY: Past Surgical History:  Procedure Laterality Date   BREAST LUMPECTOMY WITH RADIOACTIVE SEED LOCALIZATION Left 12/21/2019   Procedure: LEFT BREAST LUMPECTOMY WITH RADIOACTIVE SEED LOCALIZATION;  Surgeon: Vanderbilt Ned, MD;  Location: [facility];  Service: General;  Laterality: Left;    CESAREAN SECTION  1993, 1995   COLONOSCOPY     INTRAUTERINE DEVICE (IUD) INSERTION  08/19/2015   Mirena   POLYPECTOMY     UPPER GASTROINTESTINAL ENDOSCOPY     WISDOM TOOTH EXTRACTION      SOCIAL HISTORY: Social History   Socioeconomic History   Marital status: Married    Spouse name: Not on file   Number of children: Not on file   Years of education: Not on file   Highest education level: Not on file  Occupational History   Not on file  Tobacco Use   Smoking status: Never   Smokeless tobacco: Never  Vaping Use   Vaping status: Never Used  Substance and Sexual Activity   Alcohol use: Yes    Alcohol/week: 0.0 - 1.0 standard drinks of alcohol    Comment: Occasionally   Drug use: No   Sexual activity: Yes    Partners: Male    Comment: Mirena Inserted 08/19/15  Other Topics Concern   Not on file  Social History Narrative   Right Handed    1 Soda per Day    Social Drivers of Health   Financial Resource Strain: Not on file  Food Insecurity: Not on file  Transportation Needs: Not on file  Physical Activity: Not on file  Stress: Not on file  Social Connections: Not on file  Intimate Partner Violence: Not on file    FAMILY HISTORY: Family History  Problem Relation Age of Onset   Breast cancer Mother 10   Depression Mother    Anxiety disorder Mother    Osteoporosis Mother    Thyroid disease Mother        on synthroid   Depression Sister    Anxiety disorder Sister    Stroke Father        died 02/28/12   Dementia Father    Alzheimer's disease Paternal Aunt    Breast cancer Cousin        triple negative   Breast cancer Cousin        mom's cousin   Colon cancer Neg Hx    Colon polyps Neg Hx    Esophageal cancer Neg Hx    Rectal cancer Neg Hx    Stomach cancer Neg Hx     ALLERGIES:  is allergic to shellfish allergy and penicillins.  MEDICATIONS:  Current Outpatient Medications  Medication Sig Dispense Refill   Albuterol-Budesonide (AIRSUPRA) 90-80  MCG/ACT AERO Inhale 2 puffs into the lungs as needed (every four to six hours for cough, wheeze, shortness of breath.  Rinse, gargle, and spit after use). 10.7 g 1   budesonide-formoterol (SYMBICORT) 160-4.5 MCG/ACT inhaler Inhale 2 puffs into the lungs 2 (two) times daily.     Cholecalciferol (VITAMIN D-3) 1000 units CAPS Take 1 capsule by mouth daily.     Coenzyme Q10 (CO Q 10 PO) Take by mouth.     CRESTOR 5 MG tablet Take 5 mg by mouth daily.     famotidine (PEPCID) 40 MG tablet TAKE 1 TABLET BY MOUTH EVERY DAY IN THE EVENING 30 tablet 5   fluvoxaMINE (LUVOX) 100 MG tablet Take 100 mg by  mouth at bedtime.     ipratropium-albuterol (DUONEB) 0.5-2.5 (3) MG/3ML SOLN SMARTSIG:3 Milliliter(s) Via Nebulizer Every 6 Hours PRN     ketoconazole (NIZORAL) 2 % shampoo Apply topically 3 (three) times a week.     meloxicam (MOBIC) 15 MG tablet Take 1 tablet by mouth daily.     minoxidil (LONITEN) 2.5 MG tablet Take by mouth.     Multiple Vitamin (MULTIVITAMIN WITH MINERALS) TABS tablet Take 1 tablet by mouth daily.     NEFFY 2 MG/0.1ML SOLN Place 1 spray into the nose as needed (for life-threatening allergic reaction). 6 each 3   RYALTRIS 665-25 MCG/ACT SUSP Place 2 sprays into both nostrils 2 (two) times daily.     spironolactone (ALDACTONE) 50 MG tablet Take 50 mg by mouth 2 (two) times daily.     tamoxifen (NOLVADEX) 20 MG tablet Take 1 tablet (20 mg total) by mouth daily. 30 tablet 11   TEZSPIRE 210 MG/1. SOAJ Inject 210 mg into the skin every 28 (twenty-eight) days. 1.91 mL 11   traZODone (DESYREL) 25 mg TABS tablet Take 25 mg by mouth at bedtime.     Vonoprazan Fumarate (VOQUEZNA) 20 MG TABS Take 1 tablet by mouth in the morning. 30 tablet 5   No current facility-administered medications for this visit.   PHYSICAL EXAMINATION:  ECOG PERFORMANCE STATUS: 0 - Asymptomatic  Vital signs and physical examination not done, telehealth visit  BP 100/78   Pulse 89   Temp (!) 97.4 F (36.3 C)    Resp 18   Ht 5' 3.75 (1.619 m)   Wt 148 lb 6.4 oz (67.3 kg)   LMP 01/23/2017   SpO2 99%   BMI 25.67 kg/m   Physical Exam Constitutional:      Appearance: Normal appearance.  Cardiovascular:     Rate and Rhythm: Normal rate and regular rhythm.  Pulmonary:     Effort: Pulmonary effort is normal.     Breath sounds: Normal breath sounds.  Chest:     Comments: Bilateral breast examined.  No palpable masses or regional adenopathy Abdominal:     General: Abdomen is flat. Bowel sounds are normal.     Palpations: Abdomen is soft.  Musculoskeletal:     Cervical back: Normal range of motion and neck supple. No rigidity.  Lymphadenopathy:     Cervical: No cervical adenopathy.  Skin:    General: Skin is warm and dry.  Neurological:     General: No focal deficit present.     Mental Status: She is alert.     LABORATORY DATA:  I have reviewed the data as listed Lab Results  Component Value Date   WBC 6.5 05/26/2023   HGB 14.2 05/26/2023   HCT 42.9 05/26/2023   MCV 96.5 05/26/2023   PLT 219.0 05/26/2023     Chemistry      Component Value Date/Time   NA 139 05/26/2023 1530   K 4.5 05/26/2023 1530   CL 105 05/26/2023 1530   CO2 26 05/26/2023 1530   BUN 23 05/26/2023 1530   CREATININE 1.04 05/26/2023 1530      Component Value Date/Time   CALCIUM 8.8 05/26/2023 1530   ALKPHOS 44 12/18/2019 0930   AST 29 12/18/2019 0930   ALT 32 12/18/2019 0930   BILITOT 0.3 12/18/2019 0930       RADIOGRAPHIC STUDIES: I have personally reviewed the radiological images as listed and agreed with the findings in the report. No results found.  I spent 20  minutes in the care of this patient including history, review of records counseling and coordination of care.   Amber Stalls, MD 09/24/2023 12:09 PM

## 2023-11-19 ENCOUNTER — Ambulatory Visit

## 2023-11-19 ENCOUNTER — Ambulatory Visit
Admission: RE | Admit: 2023-11-19 | Discharge: 2023-11-19 | Disposition: A | Discharge status: Home / Self Care | Source: Ambulatory Visit | Attending: Hematology and Oncology | Admitting: Hematology and Oncology

## 2023-11-19 DIAGNOSIS — Z9189 Other specified personal risk factors, not elsewhere classified: Secondary | ICD-10-CM

## 2024-01-24 ENCOUNTER — Other Ambulatory Visit: Payer: Self-pay | Admitting: *Deleted

## 2024-01-24 MED ORDER — VOQUEZNA 20 MG PO TABS: 1.0000 | ORAL_TABLET | Freq: Every morning | ORAL | 5 refills | Status: AC

## 2024-05-25 ENCOUNTER — Ambulatory Visit: Admitting: Allergy and Immunology

## 2024-06-16 ENCOUNTER — Other Ambulatory Visit

## 2024-09-22 ENCOUNTER — Ambulatory Visit: Admitting: Hematology and Oncology
